# Patient Record
Sex: Male | Born: 1970 | Race: White | Hispanic: No | Marital: Married | Smoking: Former smoker
Health system: Southern US, Community
[De-identification: ages and names within clinical notes are randomized; demographics above are authoritative.]

## PROBLEM LIST (undated history)

## (undated) DIAGNOSIS — R911 Solitary pulmonary nodule: Secondary | ICD-10-CM

## (undated) DIAGNOSIS — F32A Depression, unspecified: Secondary | ICD-10-CM

## (undated) DIAGNOSIS — G43909 Migraine, unspecified, not intractable, without status migrainosus: Secondary | ICD-10-CM

## (undated) DIAGNOSIS — Z87891 Personal history of nicotine dependence: Secondary | ICD-10-CM

## (undated) DIAGNOSIS — I428 Other cardiomyopathies: Secondary | ICD-10-CM

## (undated) DIAGNOSIS — K219 Gastro-esophageal reflux disease without esophagitis: Secondary | ICD-10-CM

## (undated) DIAGNOSIS — J449 Chronic obstructive pulmonary disease, unspecified: Secondary | ICD-10-CM

## (undated) DIAGNOSIS — I4510 Unspecified right bundle-branch block: Secondary | ICD-10-CM

## (undated) DIAGNOSIS — Z87442 Personal history of urinary calculi: Secondary | ICD-10-CM

## (undated) DIAGNOSIS — M1990 Unspecified osteoarthritis, unspecified site: Secondary | ICD-10-CM

## (undated) DIAGNOSIS — J439 Emphysema, unspecified: Secondary | ICD-10-CM

## (undated) DIAGNOSIS — Z7952 Long term (current) use of systemic steroids: Secondary | ICD-10-CM

## (undated) DIAGNOSIS — R590 Localized enlarged lymph nodes: Secondary | ICD-10-CM

## (undated) DIAGNOSIS — J189 Pneumonia, unspecified organism: Secondary | ICD-10-CM

## (undated) DIAGNOSIS — R7303 Prediabetes: Secondary | ICD-10-CM

## (undated) DIAGNOSIS — I5020 Unspecified systolic (congestive) heart failure: Secondary | ICD-10-CM

## (undated) DIAGNOSIS — F419 Anxiety disorder, unspecified: Secondary | ICD-10-CM

## (undated) DIAGNOSIS — E2749 Other adrenocortical insufficiency: Secondary | ICD-10-CM

## (undated) DIAGNOSIS — R0600 Dyspnea, unspecified: Secondary | ICD-10-CM

## (undated) DIAGNOSIS — D869 Sarcoidosis, unspecified: Secondary | ICD-10-CM

## (undated) HISTORY — PX: COLONOSCOPY: SHX174

## (undated) HISTORY — DX: Chronic obstructive pulmonary disease, unspecified: J44.9

## (undated) HISTORY — DX: Emphysema, unspecified: J43.9

## (undated) HISTORY — DX: Solitary pulmonary nodule: R91.1

## (undated) HISTORY — DX: Unspecified right bundle-branch block: I45.10

## (undated) HISTORY — DX: Unspecified systolic (congestive) heart failure: I50.20

## (undated) HISTORY — DX: Other cardiomyopathies: I42.8

## (undated) HISTORY — DX: Gastro-esophageal reflux disease without esophagitis: K21.9

## (undated) HISTORY — PX: UPPER GI ENDOSCOPY: SHX6162

---

## 2010-11-09 HISTORY — PX: BRONCHOSCOPY: SUR163

## 2017-10-22 ENCOUNTER — Emergency Department (HOSPITAL_COMMUNITY)
Admission: EM | Admit: 2017-10-22 | Discharge: 2017-10-22 | Disposition: A | Discharge status: Home / Self Care | Payer: 59 | Attending: Emergency Medicine | Admitting: Emergency Medicine

## 2017-10-22 ENCOUNTER — Encounter (HOSPITAL_COMMUNITY): Payer: Self-pay | Admitting: *Deleted

## 2017-10-22 ENCOUNTER — Other Ambulatory Visit: Payer: Self-pay

## 2017-10-22 ENCOUNTER — Emergency Department (HOSPITAL_COMMUNITY): Payer: 59

## 2017-10-22 DIAGNOSIS — J189 Pneumonia, unspecified organism: Secondary | ICD-10-CM | POA: Insufficient documentation

## 2017-10-22 DIAGNOSIS — F17220 Nicotine dependence, chewing tobacco, uncomplicated: Secondary | ICD-10-CM | POA: Diagnosis not present

## 2017-10-22 DIAGNOSIS — R05 Cough: Secondary | ICD-10-CM | POA: Diagnosis present

## 2017-10-22 HISTORY — DX: Sarcoidosis, unspecified: D86.9

## 2017-10-22 MED ORDER — LEVOFLOXACIN 750 MG PO TABS: 750.0000 mg | ORAL_TABLET | Freq: Every day | ORAL | 0 refills | Status: DC

## 2017-10-22 NOTE — ED Triage Notes (Signed)
Pt reports non-productive cough x 4 days ago, a day after returning from [place].  Reports hx of sarcoidosis.  Pt reports feeling lightheadedness last night d/t coughing severe cough - denies any now.  Pt reports pain in his chest when coughing and taking a deep breath.

## 2017-10-22 NOTE — ED Provider Notes (Signed)
[facility]-EMERGENCY DEPT Provider Note   CSN: [id number] Arrival date & time: 10/22/17  1827     History   Chief Complaint Chief Complaint  Patient presents with  . Cough    HPI Steve Wang is a 47 y.o. male.  47 year old male presents with cough congestion times several days.  Does have a history of sarcoidosis.  Had recent travel history and denies any pleuritic or pulmonary embolism symptoms.  Cough is been productive of green-yellow sputum.  No fever or chills.  No vomiting or diarrhea.  No dyspnea on exertion.  No leg pain or swelling.  Denies any anginal symptoms or CHF symptoms.  Patient concerned he may have pneumonia and that is why he is here today.     Past Medical History:  Diagnosis Date  . Sarcoidosis     There are no active problems to display for this patient.   History reviewed. No pertinent surgical history.      Home Medications    Prior to Admission medications   Not on File    Family History No family history on file.  Social History Social History   Tobacco Use  . Smoking status: Former Smoker    Types: Cigarettes    Last attempt to quit: 2009    Years since quitting: 10.2  . Smokeless tobacco: Current User    Types: Chew  Substance Use Topics  . Alcohol use: Yes    Comment: rarely  . Drug use: Not Currently     Allergies   Patient has no known allergies.   Review of Systems Review of Systems  All other systems reviewed and are negative.    Physical Exam Updated Vital Signs BP 117/77 (BP Location: Right Arm)   Pulse 77   Temp 99.1 F (37.3 C) (Oral)   Resp 16   Ht 1.676 m (5\' 6" )   Wt 77.1 kg (170 lb)   SpO2 96%   BMI 27.44 kg/m   Physical Exam  Constitutional: He is oriented to person, place, and time. He appears well-developed and well-nourished.  Non-toxic appearance. No distress.  HENT:  Head: Normocephalic and atraumatic.  Eyes: Pupils are equal, round, and reactive to light.  Conjunctivae, EOM and lids are normal.  Neck: Normal range of motion. Neck supple. No tracheal deviation present. No thyroid mass present.  Cardiovascular: Normal rate, regular rhythm and normal heart sounds. Exam reveals no gallop.  No murmur heard. Pulmonary/Chest: Effort normal. No stridor. No respiratory distress. He has decreased breath sounds in the right lower field and the left lower field. He has wheezes in the right lower field and the left lower field. He has no rhonchi. He has no rales.  Abdominal: Soft. Normal appearance and bowel sounds are normal. He exhibits no distension. There is no tenderness. There is no rebound and no CVA tenderness.  Musculoskeletal: Normal range of motion. He exhibits no edema or tenderness.  Neurological: He is alert and oriented to person, place, and time. He has normal strength. No cranial nerve deficit or sensory deficit. GCS eye subscore is 4. GCS verbal subscore is 5. GCS motor subscore is 6.  Skin: Skin is warm and dry. No abrasion and no rash noted.  Psychiatric: He has a normal mood and affect. His speech is normal and behavior is normal.  Nursing note and vitals reviewed.    ED Treatments / Results  Labs (all labs ordered are listed, but only abnormal results are displayed) Labs Reviewed -  No data to display  EKG None  Radiology Dg Chest 2 View  Result Date: 10/22/2017 CLINICAL DATA:  Cough and fevers for 1 week, history of sarcoidosis EXAM: CHEST - 2 VIEW COMPARISON:  None. FINDINGS: Cardiac shadow is within normal limits. Some chronic fibrotic appearing changes are noted. Some slight increased density is noted in the right perihilar region which may represent some acute on chronic infiltrate. No bony abnormality is seen. No effusion is noted. IMPRESSION: Changes consistent with the given clinical history of sarcoidosis. Slight increased density is noted in the right perihilar region likely representing acute on chronic infiltrate. Followup  PA and lateral chest X-ray is recommended in 3-4 weeks following trial of antibiotic therapy to ensure resolution and exclude underlying malignancy. Electronically Signed   By: Alcide Clever M.D.   On: 10/22/2017 19:51    Procedures Procedures (including critical care time)  Medications Ordered in ED Medications - No data to display   Initial Impression / Assessment and Plan / ED Course  I have reviewed the triage vital signs and the nursing notes.  Pertinent labs & imaging results that were available during my care of the patient were reviewed by me and considered in my medical decision making (see chart for details).     Place patient on Levaquin and discharged home with return precautions  Final Clinical Impressions(s) / ED Diagnoses   Final diagnoses:  None    ED Discharge Orders    None       Lorre Nick, MD 10/22/17 2120

## 2018-06-25 ENCOUNTER — Encounter: Payer: Self-pay | Admitting: Family Medicine

## 2018-06-25 ENCOUNTER — Ambulatory Visit (INDEPENDENT_AMBULATORY_CARE_PROVIDER_SITE_OTHER): Payer: 59 | Admitting: Family Medicine

## 2018-06-25 VITALS — BP 120/82 | HR 69 | Temp 99.0°F | Resp 16 | Ht 66.5 in | Wt 154.0 lb

## 2018-06-25 DIAGNOSIS — D860 Sarcoidosis of lung: Secondary | ICD-10-CM | POA: Diagnosis not present

## 2018-06-25 DIAGNOSIS — Z23 Encounter for immunization: Secondary | ICD-10-CM | POA: Diagnosis not present

## 2018-06-25 DIAGNOSIS — M25511 Pain in right shoulder: Secondary | ICD-10-CM | POA: Diagnosis not present

## 2018-06-25 DIAGNOSIS — Z8669 Personal history of other diseases of the nervous system and sense organs: Secondary | ICD-10-CM

## 2018-06-25 DIAGNOSIS — Z13228 Encounter for screening for other metabolic disorders: Secondary | ICD-10-CM

## 2018-06-25 DIAGNOSIS — Z113 Encounter for screening for infections with a predominantly sexual mode of transmission: Secondary | ICD-10-CM

## 2018-06-25 DIAGNOSIS — G8929 Other chronic pain: Secondary | ICD-10-CM | POA: Insufficient documentation

## 2018-06-25 DIAGNOSIS — Z136 Encounter for screening for cardiovascular disorders: Secondary | ICD-10-CM

## 2018-06-25 DIAGNOSIS — Z87442 Personal history of urinary calculi: Secondary | ICD-10-CM | POA: Insufficient documentation

## 2018-06-25 DIAGNOSIS — K219 Gastro-esophageal reflux disease without esophagitis: Secondary | ICD-10-CM | POA: Insufficient documentation

## 2018-06-25 NOTE — Progress Notes (Signed)
Patient: Steve Wang Male    DOB: 1970-10-25   47 y.o.   MRN: [id number] Visit Date: 06/25/2018  Today's Provider: [name], MD   Chief Complaint  Patient presents with  . Establish Care   Subjective:    HPI Establish Care: Patient is here today to establish care. Patient moved from [place] to [place] about 19 months ago, working in [employer] housing, mainly in [place]. He has several issues he would like addressed today.  He states he is in a new relationship and requests STD screening. He denies any urinary or other STD symptoms at this time.   He also reports that he was diagnosed with sarcoidosis around 2011. He was followed by pulmonary and was on prednisone and methotrexate for awhile, but has not been off of all treatment for 5-6 years. He states he does get short of breath after walking about 30 years, but has not been progressive. He also reports about once or twice a month he will have episode of having crampy pain on the right side of his chest associated with taking deep breaths that will will resolve after a couple of minutes. Has been a few weeks since last episode.   He also reports he has been having frequent pain in his right shoulder, thinks he may have pulled a muscle but doesn't recall any specific injury.   He also reports he has been getting migraine headaches starting about 5 years ago. They occur about once a month. Usually started behind right eye, then spread across both eyes, sometimes associated with nausea and vomiting. Will usually resolve a few hours after taking an Excedrin Migraine.   He also states he has been having pain in his right shoulder for several months limited use, especially overhead lifting.     No Known Allergies  No current outpatient medications on file.  Review of Systems  Constitutional: Negative for appetite change, chills and fever.  Respiratory: Positive for cough (dry) and shortness of breath (when climbing  stairs). Negative for chest tightness and wheezing.   Cardiovascular: Negative for chest pain and palpitations.  Gastrointestinal: Negative for abdominal pain, nausea and vomiting.    Social History   Tobacco Use  . Smoking status: Former Smoker    Types: Cigarettes    Last attempt to quit: 07/23/2008    Years since quitting: 9.9  . Smokeless tobacco: Current User    Types: Chew  Substance Use Topics  . Alcohol use: Yes    Comment: rarely   Objective:   BP 120/82 (BP Location: Left Arm, Patient Position: Sitting, Cuff Size: Large)   Pulse 69   Temp 99 F (37.2 C) (Oral)   Resp 16   Ht 5' 6.5" (1.689 m)   Wt 154 lb (69.9 kg)   SpO2 98% Comment: room air  BMI 24.48 kg/m  Vitals:   06/25/18 1401  BP: 120/82  Pulse: 69  Resp: 16  Temp: 99 F (37.2 C)  TempSrc: Oral  SpO2: 98%  Weight: 154 lb (69.9 kg)  Height: 5' 6.5" (1.689 m)     Physical Exam   General Appearance:    Alert, cooperative, no distress  Eyes:    PERRL, conjunctiva/corneas clear, EOM's intact       Lungs:     Clear to auscultation bilaterally, respirations unlabored  Heart:    Regular rate and rhythm  Neurologic:   Awake, alert, oriented x 3. No apparent focal neurological  defect.          Assessment & Plan:     1. Sarcoidosis of lung (HCC) Previously on prednisone and methotrexate per patient, but off meds and stable for several years. No other systems affected. He did have xray with density in right perihilar region in April when he was treated with levofloxacin. Needs follow up Xray.  Consider referral for PFTs.  - DG Chest 2 View; Future  - Pneumococcal polysaccharide vaccine 23-valent greater than or equal to 2yo subcutaneous/IM  2. Need for prophylactic vaccination using tetanus and diphtheria toxoids adsorbed (Td) vaccine  - Tdap vaccine greater than or equal to 7yo IM  3. Need for pneumococcal vaccination  - Pneumococcal polysaccharide vaccine 23-valent greater than or  equal to 2yo subcutaneous/IM  4. Chronic right shoulder pain Xray right shoulder.   5. Screen for STD (sexually transmitted disease)  - HIV Antibody (routine testing w rflx) - RPR - GC/Chlamydia Probe Amp  6. Screening for metabolic disorder  - Comprehensive metabolic panel - Lipid panel  7. Encounter for special screening examination for cardiovascular disorder  - Comprehensive metabolic panel - Lipid panel  8. History of migraine        [name], MD  [facility]

## 2018-06-25 NOTE — Patient Instructions (Signed)
.   Please go to the lab draw station in [address] on the second floor of [facility]   Go to the [facility] on [address] for chest and right shoulder Xray

## 2018-06-26 ENCOUNTER — Telehealth: Payer: Self-pay

## 2018-06-26 LAB — GC/CHLAMYDIA PROBE AMP
CHLAMYDIA, DNA PROBE: NEGATIVE
NEISSERIA GONORRHOEAE BY PCR: NEGATIVE

## 2018-06-26 LAB — COMPREHENSIVE METABOLIC PANEL
A/G RATIO: 1.6 (ref 1.2–2.2)
ALBUMIN: 4.5 g/dL (ref 3.5–5.5)
ALT: 21 IU/L (ref 0–44)
AST: 19 IU/L (ref 0–40)
Alkaline Phosphatase: 65 IU/L (ref 39–117)
BUN/Creatinine Ratio: 11 (ref 9–20)
BUN: 10 mg/dL (ref 6–24)
Bilirubin Total: 0.5 mg/dL (ref 0.0–1.2)
CO2: 24 mmol/L (ref 20–29)
Calcium: 9.6 mg/dL (ref 8.7–10.2)
Chloride: 101 mmol/L (ref 96–106)
Creatinine, Ser: 0.91 mg/dL (ref 0.76–1.27)
GFR, EST AFRICAN AMERICAN: 116 mL/min/{1.73_m2} (ref >59)
GFR, EST NON AFRICAN AMERICAN: 101 mL/min/{1.73_m2} (ref >59)
GLOBULIN, TOTAL: 2.8 g/dL (ref 1.5–4.5)
Glucose: 88 mg/dL (ref 65–99)
POTASSIUM: 4.6 mmol/L (ref 3.5–5.2)
SODIUM: 140 mmol/L (ref 134–144)
TOTAL PROTEIN: 7.3 g/dL (ref 6.0–8.5)

## 2018-06-26 LAB — LIPID PANEL
CHOL/HDL RATIO: 2.7 ratio (ref 0.0–5.0)
Cholesterol, Total: 155 mg/dL (ref 100–199)
HDL: 57 mg/dL (ref >39)
LDL Calculated: 84 mg/dL (ref 0–99)
Triglycerides: 70 mg/dL (ref 0–149)
VLDL Cholesterol Cal: 14 mg/dL (ref 5–40)

## 2018-06-26 LAB — HIV ANTIBODY (ROUTINE TESTING W REFLEX): HIV SCREEN 4TH GENERATION: NONREACTIVE

## 2018-06-26 LAB — RPR: RPR Ser Ql: NONREACTIVE

## 2018-06-26 NOTE — Telephone Encounter (Signed)
Left message to call back  

## 2018-06-26 NOTE — Telephone Encounter (Signed)
Advised patient of results.  

## 2018-06-26 NOTE — Telephone Encounter (Signed)
-----   Message from Malva Limes, MD sent at 06/26/2018 12:16 PM EST ----- Labs all normal. STD screenings including HIV, syphilis, GC and gonorrhea or all negative.

## 2018-06-27 ENCOUNTER — Ambulatory Visit
Admission: RE | Admit: 2018-06-27 | Discharge: 2018-06-27 | Disposition: A | Discharge status: Home / Self Care | Payer: 59 | Source: Ambulatory Visit | Attending: Family Medicine | Admitting: Family Medicine

## 2018-06-27 ENCOUNTER — Ambulatory Visit
Admission: RE | Admit: 2018-06-27 | Discharge: 2018-06-27 | Disposition: A | Discharge status: Home / Self Care | Payer: 59 | Attending: Family Medicine | Admitting: Family Medicine

## 2018-06-27 DIAGNOSIS — D869 Sarcoidosis, unspecified: Secondary | ICD-10-CM | POA: Insufficient documentation

## 2018-06-27 DIAGNOSIS — D860 Sarcoidosis of lung: Secondary | ICD-10-CM

## 2018-06-27 DIAGNOSIS — M19011 Primary osteoarthritis, right shoulder: Secondary | ICD-10-CM | POA: Diagnosis not present

## 2018-06-27 DIAGNOSIS — J449 Chronic obstructive pulmonary disease, unspecified: Secondary | ICD-10-CM | POA: Diagnosis not present

## 2018-06-30 ENCOUNTER — Telehealth: Payer: Self-pay

## 2018-06-30 DIAGNOSIS — G8929 Other chronic pain: Secondary | ICD-10-CM

## 2018-06-30 DIAGNOSIS — M25511 Pain in right shoulder: Principal | ICD-10-CM

## 2018-06-30 NOTE — Telephone Encounter (Signed)
-----   Message from Malva Limes, MD sent at 06/28/2018  6:15 PM EST ----- Xray shows spur in his shoulder joint which is probably cause of his shoulder pain. Recommend physical therapy referral.  X-ray shows mild sarcoidosis. Need to check chest xray and labs yearly.

## 2018-06-30 NOTE — Telephone Encounter (Signed)
Patient calling back with questions. He is asking if there's treatment for the Sarcoidosis and if PT doesn't help the spur in his joint what would be the next step.  Please Review.

## 2018-06-30 NOTE — Telephone Encounter (Signed)
Pt advised.   Thanks,   -Ramond Darnell  

## 2018-06-30 NOTE — Telephone Encounter (Addendum)
Sarcoid usually just requires monitoring with blood tests and lung functions tests. If it is progressing then it is treated with immunosuppressant medications  I think it would be worthwhile to follow up with a lung specialist for monitoring. We can refer him to lung specialist in [facility] that manages pulmonary sarcoid.   If shoulder doesn't improve with PT and he wants something more done, then he will need to see an orthopedist. They would probably give him a steroid injection in his shoulder and if all else fails could do shoulder surgery.

## 2018-06-30 NOTE — Telephone Encounter (Signed)
Patient advised as directed below. Patient requested copy of results. Printed and placed up front ready for pick up.  Patient agree to referral.  Thanks,  -Tayshaun Kroh

## 2018-06-30 NOTE — Telephone Encounter (Signed)
Please advise.  (I think he was looking at the chest Xray in 10/2017)  Thanks,   -Vernona Rieger

## 2018-06-30 NOTE — Telephone Encounter (Signed)
I think he is looking at the xray from April. There is nothing on current xray report about antibiotic.

## 2018-06-30 NOTE — Telephone Encounter (Signed)
Patient called back requesting clarification from CMA in reference to his chest x-ray. Patient states that on my chart annotation shows that patient should be on a antibiotic. Patient wants to know if Dr. Sherrie Mustache is prescribing antibiotic? Please advise. KW

## 2018-07-01 NOTE — Telephone Encounter (Signed)
Pt advised.  Is there a way to release is recent lab work and Xray results to he can see it on My Chart?  Thanks,   -Vernona Rieger

## 2018-07-01 NOTE — Telephone Encounter (Signed)
LMTCB 07/01/2018  Thanks,   -Lynnann Knudsen  

## 2018-08-05 ENCOUNTER — Encounter: Payer: Self-pay | Admitting: Family Medicine

## 2018-08-05 ENCOUNTER — Ambulatory Visit (INDEPENDENT_AMBULATORY_CARE_PROVIDER_SITE_OTHER): Payer: 59 | Admitting: Family Medicine

## 2018-08-05 VITALS — BP 112/72 | HR 68 | Temp 98.5°F | Resp 16 | Ht 67.0 in | Wt 156.0 lb

## 2018-08-05 DIAGNOSIS — D860 Sarcoidosis of lung: Secondary | ICD-10-CM

## 2018-08-05 DIAGNOSIS — Z0000 Encounter for general adult medical examination without abnormal findings: Secondary | ICD-10-CM

## 2018-08-05 DIAGNOSIS — R21 Rash and other nonspecific skin eruption: Secondary | ICD-10-CM

## 2018-08-05 DIAGNOSIS — I4510 Unspecified right bundle-branch block: Secondary | ICD-10-CM | POA: Diagnosis not present

## 2018-08-05 DIAGNOSIS — G4710 Hypersomnia, unspecified: Secondary | ICD-10-CM

## 2018-08-05 DIAGNOSIS — R9431 Abnormal electrocardiogram [ECG] [EKG]: Secondary | ICD-10-CM

## 2018-08-05 NOTE — Patient Instructions (Signed)
. Please bring all of your medications to every appointment so we can make sure that our medication list is the same as yours.    Right Bundle Branch Block  Right bundle branch block (RBBB) is a problem with the way that electrical impulses pass through the heart (electrical conduction abnormality). The heart depends on an electrical pulse to beat normally. The electrical signal for a heartbeat starts in the upper chambers of the heart (atria) and then travels to the two lower chambers (left and rightventricles). An RBBB is a partial or complete block of the pathway that carries the signal to the right ventricle. If you have RBBB, the right side of your heart beats a little more slowly than the left side. RBBB may be a warning of heart disease or a lung problem. What are the causes? This condition may be caused by:  Heart attack (myocardial infarction).  Being born with a heart defect (congenital heart disease).  A blood clot that flows into the lung (pulmonary embolism).  Infection of heart muscle (myocarditis).  High blood pressure (hypertension). In some cases, the cause may not be known. What increases the risk? The following factors may make you more likely to develop this condition:  Being male.  Being 47 years of age or older.  Having heart disease.  Having had a heart attack or heart surgery. What are the signs or symptoms? This condition does not typically cause symptoms. How is this diagnosed? This condition may be diagnosed based on an electrocardiogram (ECG). It is often diagnosed when an ECG is done as part of a routine physical. You may also have imaging tests to find out more about your condition. These may include:  Chest X-rays.  Echocardiogram. How is this treated? Treatment may not be needed for this condition if you do not have symptoms or any other heart problems. However, you may need to see your health care provider more often because RBBB can be a  warning sign of future heart or lung problems. You may need treatment for another condition that may be causing RBBB. If other forms of heart block are present and you are having symptoms, a pacemaker is sometimes necessary. Follow these instructions at home:  Follow instructions from your health care provider about eating or drinking restrictions.  Take over-the-counter and prescription medicines only as told by your health care provider.  Return to your normal activities as told by your health care provider. Ask your health care provider what activities are safe for you.  Follow a heart-healthy diet and maintain a healthy weight. Work with a diet and nutrition specialist (dietitian) to create an eating plan that is best for you.  Get regular exercise as told by your health care provider.  Do not use any products that contain nicotine or tobacco, such as cigarettes and e-cigarettes. If you need help quitting, ask your health care provider.  Keep all follow-up visits as told by your health care provider. This is important. Contact a health care provider if:  You are light-headed or you faint. Get help right away if:  You have chest pain.  You have difficulty breathing. These symptoms may represent a serious problem that is an emergency. Do not wait to see if the symptoms will go away. Get medical help right away. Call your local emergency services (911 in the [place] Do not drive yourself to the hospital.  Summary  For the heart to beat normally, an electrical signal must travel to the  heart's lower right chamber (right ventricle). Right bundle branch block (RBBB) is a partial or complete block of the pathway that carries that signal.  This condition does not typically cause symptoms.  Treatment may not be needed for RBBB if you do not have symptoms or any other heart problems.  You may need to see your health care provider more often because RBBB can be a warning sign of future  heart or lung problems. This information is not intended to replace advice given to you by your health care provider. Make sure you discuss any questions you have with your health care provider. Document Released: 12/20/2016 Document Revised: 12/20/2016 Document Reviewed: 12/20/2016 Elsevier Interactive Patient Education  2019 [employer].

## 2018-08-05 NOTE — Progress Notes (Signed)
Patient: Steve Wang, Male    DOB: [birth year], 48 y.o.   MRN: [id number] Visit Date: 08/05/2018  Today's Provider: [name], MD   Chief Complaint  Patient presents with  . Annual Exam   Subjective:    Annual physical exam Steve Wang is a 48 y.o. male who presents today for health maintenance and complete physical. He feels fairly well. He reports exercising daily. He reports he is sleeping poorly.  -----------------------------------------------------------------  Follow up for Sarcoidosis of Lung:  The patient was last seen for this 1 months ago. Management during the last visit include ordering a chest x ray, which was stable. Patient was advised to have follow up chest x- ray yearly.  He reports good compliance with treatment. He feels that condition is stable.  ------------------------------------------------------------------------------------  He has had trouble with flaking head and scalp and faint red scaly rash on his chest that comes and goes.   Review of Systems  Constitutional: Negative for appetite change, chills, fatigue and fever.  HENT: Positive for tinnitus. Negative for congestion, ear pain, hearing loss, nosebleeds and trouble swallowing.   Eyes: Negative for pain and visual disturbance.  Respiratory: Negative for cough, chest tightness and shortness of breath.   Cardiovascular: Negative for chest pain, palpitations and leg swelling.  Gastrointestinal: Negative for abdominal pain, blood in stool, constipation, diarrhea, nausea and vomiting.  Endocrine: Negative for polydipsia, polyphagia and polyuria.  Genitourinary: Negative for dysuria and flank pain.  Musculoskeletal: Negative for arthralgias, back pain, joint swelling, myalgias and neck stiffness.  Skin: Negative for color change, rash and wound.  Neurological: Negative for dizziness, tremors, seizures, speech difficulty, weakness, light-headedness and headaches.  Psychiatric/Behavioral:  Negative for behavioral problems, confusion, decreased concentration, dysphoric mood and sleep disturbance. The patient is not nervous/anxious.   All other systems reviewed and are negative.   Social History      He  reports that he quit smoking about 10 years ago. His smoking use included cigarettes. His smokeless tobacco use includes chew. He reports current alcohol use. He reports previous drug use.       Social History   Socioeconomic History  . Marital status: Single    Spouse name: Not on file  . Number of children: 0  . Years of education: Not on file  . Highest education level: Not on file  Occupational History  . Occupation: [employer]  Social Needs  . Financial resource strain: Not on file  . Food insecurity:    Worry: Not on file    Inability: Not on file  . Transportation needs:    Medical: Not on file    Non-medical: Not on file  Tobacco Use  . Smoking status: Former Smoker    Types: Cigarettes    Last attempt to quit: 07/23/2008    Years since quitting: 10.0  . Smokeless tobacco: Current User    Types: Chew  . Tobacco comment: smoked about 1 ppd for 24 years  Substance and Sexual Activity  . Alcohol use: Yes    Comment: rarely  . Drug use: Not Currently  . Sexual activity: Not on file  Lifestyle  . Physical activity:    Days per week: Not on file    Minutes per session: Not on file  . Stress: Not on file  Relationships  . Social connections:    Talks on phone: Not on file    Gets together: Not on file    Attends religious service: Not  on file    Active member of club or organization: Not on file    Attends meetings of clubs or organizations: Not on file    Relationship status: Not on file  Other Topics Concern  . Not on file  Social History Narrative  . Not on file    Past Medical History:  Diagnosis Date  . GERD (gastroesophageal reflux disease)   . Sarcoidosis      Patient Active Problem List   Diagnosis Date Noted  . GERD  (gastroesophageal reflux disease) 06/25/2018  . Sarcoidosis of lung (HCC) 06/25/2018  . Chronic right shoulder pain 06/25/2018  . History of migraine 06/25/2018  . History of kidney stones 06/25/2018    No past surgical history on file.  Family History        Family Status  Relation Name Status  . Mother  Alive  . Father  Deceased  . Brother Half brother Alive        His family history is not on file.      No Known Allergies  No current outpatient medications on file.   Patient Care Team: Malva LimesFisher, Neriah Brott E, MD as PCP - General (Family Medicine)      Objective:   Vitals: BP 112/72 (BP Location: Right Arm, Cuff Size: Large)   Pulse 68   Temp 98.5 F (36.9 C) (Oral)   Resp 16   Ht 5\' 7"  (1.702 m)   Wt 156 lb (70.8 kg)   SpO2 96% Comment: room air  BMI 24.43 kg/m    Vitals:   08/05/18 1508 08/05/18 1510  BP: 102/72 112/72  Pulse: 68   Resp: 16   Temp: 98.5 F (36.9 C)   TempSrc: Oral   SpO2: 96%   Weight: 156 lb (70.8 kg)   Height: 5\' 7"  (1.702 m)      Physical Exam   General Appearance:    Alert, cooperative, no distress, appears stated age  Head:    Normocephalic, without obvious abnormality, atraumatic  Eyes:    PERRL, conjunctiva/corneas clear, EOM's intact, fundi    benign, both eyes       Ears:    Normal TM's and external ear canals, both ears  Nose:   Nares normal, septum midline, mucosa normal, no drainage   or sinus tenderness  Throat:   Lips, mucosa, and tongue normal; teeth and gums normal  Neck:   Supple, symmetrical, trachea midline, no adenopathy;       thyroid:  No enlargement/tenderness/nodules; no carotid   bruit or JVD  Back:     Symmetric, no curvature, ROM normal, no CVA tenderness  Lungs:     Clear to auscultation bilaterally, respirations unlabored  Chest wall:    No tenderness or deformity  Heart:    Regular rate and rhythm, S1 and S2 normal, no murmur, rub   or gallop  Abdomen:     Soft, non-tender, bowel sounds active all  four quadrants,    no masses, no organomegaly  Genitalia:    deferred  Rectal:    deferred  Extremities:   Extremities normal, atraumatic, no cyanosis or edema  Pulses:   2+ and symmetric all extremities  Skin:   Skin color, texture, turgor normal, no rashes or lesions  Lymph nodes:   Cervical, supraclavicular, and axillary nodes normal  Neurologic:   CNII-XII intact. 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   -Atypical T axis -possible acute process.   ABNORMAL   Assessment & Plan:     Routine Health Maintenance and Physical Exam  Exercise Activities and Dietary recommendations Goals   None     Immunization History  Administered Date(s) Administered  . Pneumococcal Polysaccharide-23 06/25/2018  . Tdap 06/25/2018    Health Maintenance  Topic Date Due  . INFLUENZA VACCINE  12/19/2018 (Originally 02/20/2018)  . TETANUS/TDAP  06/25/2028  . HIV Screening  Completed     Discussed health benefits of physical activity, and encouraged him to engage in regular exercise appropriate for his age and condition.    --------------------------------------------------------------------  1. Annual physical exam  - EKG 12-Lead - Ambulatory referral to Cardiology  2. Sarcoidosis of lung (HCC) Has not seen pulmonologist for several years having moved from [place] about 2 years ago. No recent change in breathing and  - EKG 12-Lead  3. Right bundle branch block (RBBB) Considering his age and history is concerning for cardiac sarcoid. Will have cardiology evaluate. Advised patient he will likely need to be seen in [facility] - Ambulatory referral to Cardiology  4. Hypersomnia  - Home sleep test  5. Abnormal EKG  - Ambulatory referral to Cardiology  6. Rash  - Ambulatory referral to  Dermatology    Mila Merryonald Xela Oregel, MD  [facility]

## 2018-08-15 ENCOUNTER — Telehealth: Payer: Self-pay | Admitting: Family Medicine

## 2018-08-15 NOTE — Telephone Encounter (Signed)
Order for home sleep study faxed to [facility]

## 2018-08-19 ENCOUNTER — Telehealth: Payer: Self-pay | Admitting: Family Medicine

## 2018-08-19 NOTE — Telephone Encounter (Signed)
FYI--Pt has refused cardiology referral. He was referred for Z00.00 (ICD-10-CM) - Annual physical exam I45.10 (ICD-10-CM) - Right bundle branch block (RBBB) R94.31 (ICD-10-CM) - Abnormal EKG

## 2018-08-21 NOTE — Telephone Encounter (Signed)
Pt has set up appointment with Dr Allyson Sabal for Feb 2020.Disregard previous message

## 2018-09-10 ENCOUNTER — Ambulatory Visit (INDEPENDENT_AMBULATORY_CARE_PROVIDER_SITE_OTHER): Payer: 59 | Admitting: Cardiovascular Disease

## 2018-09-10 ENCOUNTER — Encounter: Payer: Self-pay | Admitting: Cardiovascular Disease

## 2018-09-10 VITALS — BP 98/64 | HR 83 | Ht 67.0 in | Wt 157.0 lb

## 2018-09-10 DIAGNOSIS — I4510 Unspecified right bundle-branch block: Secondary | ICD-10-CM | POA: Insufficient documentation

## 2018-09-10 NOTE — Patient Instructions (Signed)
Medication Instructions:  Your physician recommends that you continue on your current medications as directed. Please refer to the Current Medication list given to you today.  If you need a refill on your cardiac medications before your next appointment, please call your pharmacy.   Lab work: NONE If you have labs (blood work) drawn today and your tests are completely normal, you will receive your results only by: Marland Kitchen MyChart Message (if you have MyChart) OR . A paper copy in the mail If you have any lab test that is abnormal or we need to change your treatment, we will call you to review the results.  Testing/Procedures: Your physician has requested that you have an echocardiogram. Echocardiography is a painless test that uses sound waves to create images of your heart. It provides your doctor with information about the size and shape of your heart and how well your heart's chambers and valves are working. This procedure takes approximately one hour. There are no restrictions for this procedure.    Follow-Up: At [facility], you and your health needs are our priority.  As part of our continuing mission to provide you with exceptional heart care, we have created designated Provider Care Teams.  These Care Teams include your primary Cardiologist (physician) and Advanced Practice Providers (APPs -  Physician Assistants and Nurse Practitioners) who all work together to provide you with the care you need, when you need it. . You will need a follow up appointment AS NEEDED. You may see Dr. Allyson Sabal or one of the following Advanced Practice Providers on your designated Care Team:   . Corine Shelter, [place] . Azalee Course, PA-C . Micah Flesher, PA-C . Joni Reining, DNP . Theodore Demark, PA-C . Judy Pimple, PA-C . Marjie Skiff, PA-C

## 2018-09-10 NOTE — Progress Notes (Signed)
09/10/2018 Steve Wang   1970/12/20  [id number]  Primary Physician Steve Wang Steve Isaacs, MD Primary Cardiologist: Steve Gess MD Steve Wang, Steve Wang  HPI:  Steve Wang is a 48 y.o. 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  He was referred by Dr. Sherrie Wang, his PCP, for evaluation of newly recognized right bundle branch block.  He has no cardiac risk factors.  He quit smoking 18 years ago.  There is no family history for heart disease.  Never had a heart attack or stroke.  He does complain of some dyspnea but denies chest pain.  He had pulmonary sarcoidosis back in 2009 diagnosed [facility] in [place] and was treated with steroids and methotrexate.  There is no mention of cardiac involvement.   No outpatient medications have been marked as taking for the 09/10/18 encounter (Office Visit) with Steve Gess, MD.     No Known Allergies  Social History   Socioeconomic History  . Marital status: Single    Spouse name: Not on file  . Number of children: 0  . Years of education: Not on file  . Highest education level: Not on file  Occupational History  . Occupation: [employer]  Social Needs  . Financial resource strain: Not on file  . Food insecurity:    Worry: Not on file    Inability: Not on file  . Transportation needs:    Medical: Not on file    Non-medical: Not on file  Tobacco Use  . Smoking status: Former Smoker    Types: Cigarettes    Last attempt to quit: 07/23/2008    Years since quitting: 10.1  . Smokeless tobacco: Current User    Types: Chew  . Tobacco comment: smoked about 1 ppd for 24 years  Substance and Sexual Activity  . Alcohol use: Yes    Comment: rarely  . Drug use: Not Currently  . Sexual activity: Not on file  Lifestyle  . Physical activity:    Days per week: Not on file    Minutes per session: Not on file  . Stress: Not on  file  Relationships  . Social connections:    Talks on phone: Not on file    Gets together: Not on file    Attends religious service: Not on file    Active member of club or organization: Not on file    Attends meetings of clubs or organizations: Not on file    Relationship status: Not on file  . Intimate partner violence:    Fear of current or ex partner: Not on file    Emotionally abused: Not on file    Physically abused: Not on file    Forced sexual activity: Not on file  Other Topics Concern  . Not on file  Social History Narrative  . Not on file     Review of Systems: General: negative for chills, fever, night sweats or weight changes.  Cardiovascular: negative for chest pain, dyspnea on exertion, edema, orthopnea, palpitations, paroxysmal nocturnal dyspnea or shortness of breath Dermatological: negative for rash Respiratory: negative for cough or wheezing Urologic: negative for hematuria Abdominal: negative for nausea, vomiting, diarrhea, bright red blood per rectum, melena, or hematemesis Neurologic: negative for visual changes, syncope, or dizziness All other systems reviewed and are otherwise negative except as noted above.    Blood pressure 98/64, pulse  83, height 5\' 7"  (1.702 m), weight 157 lb (71.2 kg).  General appearance: alert and no distress Neck: no adenopathy, no carotid bruit, no JVD, supple, symmetrical, trachea midline and thyroid not enlarged, symmetric, no tenderness/mass/nodules Lungs: clear to auscultation bilaterally Heart: regular rate and rhythm, S1, S2 normal, no murmur, click, rub or gallop Extremities: extremities normal, atraumatic, no cyanosis or edema Pulses: 2+ and symmetric Skin: Skin color, texture, turgor normal. No rashes or lesions Neurologic: Alert and oriented X 3, normal strength and tone. Normal symmetric reflexes. Normal coordination and gait  EKG not performed today  ASSESSMENT AND PLAN:   Right bundle branch block [name]  was referred by Dr. Sherrie Wang for evaluation of right bundle branch block.  He is unaware that he has had an EKG anytime recently.  He does have a remote history of sarcoidosis with pulmonary involvement on steroids and methotrexate remotely at [facility].  He is completely asymptomatic.  Certainly possible that sarcoidosis has affected his conduction system.  We will get a 2D echo to further evaluate.  He has had no rhythm related abnormalities.      Steve Gess MD FACP,FACC,FAHA, [facility] 09/10/2018 1:33 PM

## 2018-09-10 NOTE — Assessment & Plan Note (Signed)
Steve Wang was referred by Dr. Sherrie Mustache for evaluation of right bundle branch block.  He is unaware that he has had an EKG anytime recently.  He does have a remote history of sarcoidosis with pulmonary involvement on steroids and methotrexate remotely at [facility] [place].  He is completely asymptomatic.  Certainly possible that sarcoidosis has affected his conduction system.  We will get a 2D echo to further evaluate.  He has had no rhythm related abnormalities.

## 2018-09-12 ENCOUNTER — Other Ambulatory Visit (HOSPITAL_COMMUNITY): Payer: 59

## 2018-09-15 ENCOUNTER — Other Ambulatory Visit (HOSPITAL_COMMUNITY): Payer: 59

## 2018-09-19 ENCOUNTER — Other Ambulatory Visit (HOSPITAL_COMMUNITY): Payer: 59

## 2018-09-22 ENCOUNTER — Ambulatory Visit (HOSPITAL_COMMUNITY): Payer: 59 | Attending: Internal Medicine

## 2018-09-22 DIAGNOSIS — I4510 Unspecified right bundle-branch block: Secondary | ICD-10-CM | POA: Diagnosis not present

## 2020-07-28 ENCOUNTER — Ambulatory Visit: Payer: Self-pay | Admitting: Family Medicine

## 2020-07-28 NOTE — Telephone Encounter (Signed)
Pt. Called to report testing positive for COVID one week ago.  Is asking for an appt. For evaluation.  C/o fever, body aches, and cough.  Last fever was on 07/23/20.  Reported increase in cough 2 days ago with an intermittent "burning" in the throat and upper chest.  Described as a "raw burning" when he coughs, and is felt in the throat and upper chest just below collar bone, mid-line. Denied radiation of the chest burning.  Denied any shortness of breath or wheezing.   Voiced concern about hx of Sarcoidosis, and poss. Need for abx.  Virtual appt. Sched. 07/29/20.  Care advice given per protocol.  Pt. Verb. Understanding.  Agreed with plan.  Reason for Disposition . [1] COVID-19 diagnosed by positive lab test (e.g., PCR, rapid self-test kit) AND [2] mild symptoms (e.g., cough, fever, others) AND [3] no complications or SOB    Reported an intermittent "raw burning" in throat and in upper chest, that is present with cough; denied any radiation of pain.  Denied any shortness of breath/ wheezing.  Answer Assessment - Initial Assessment Questions 1. COVID-19 DIAGNOSIS: "Who made your COVID-19 diagnosis?" "Was it confirmed by a positive lab test?" If not diagnosed by a HCP, ask "Are there lots of cases (community spread) where you live?" Note: See public health department website, if unsure.     Did home test and it was positive  2. COVID-19 EXPOSURE: "Was there any known exposure to COVID before the symptoms began?" CDC Definition of close contact: within 6 feet (2 meters) for a total of 15 minutes or more over a 24-hour period.       3. ONSET: "When did the COVID-19 symptoms start?"      07/21/20 4. WORST SYMPTOM: "What is your worst symptom?" (e.g., cough, fever, shortness of breath, muscle aches)     Cough and a burning sensation in upper chest/ throat area  5. COUGH: "Do you have a cough?" If Yes, ask: "How bad is the cough?"       Some phlegm-white  6. FEVER: "Do you have a fever?" If Yes, ask: "What is  your temperature, how was it measured, and when did it start?"     No fever since 07/23/20 7. RESPIRATORY STATUS: "Describe your breathing?" (e.g., shortness of breath, wheezing, unable to speak)      Denied  8. BETTER-SAME-WORSE: "Are you getting better, staying the same or getting worse compared to yesterday?"  If getting worse, ask, "In what way?"     Feels that he is better; cough increased 2 days ago  9. HIGH RISK DISEASE: "Do you have any chronic medical problems?" (e.g., asthma, heart or lung disease, weak immune system, obesity, etc.)     Sarcoidosis  10. VACCINE: "Have you gotten the COVID-19 vaccine?" If Yes ask: "Which one, how many shots, when did you get it?"       Denied  11. PREGNANCY: "Is there any chance you are pregnant?" "When was your last menstrual period?"       N/a  12. OTHER SYMPTOMS: "Do you have any other symptoms?"  (e.g., chills, fatigue, headache, loss of smell or taste, muscle pain, sore throat; new loss of smell or taste especially support the diagnosis of COVID-19)       Had fever; subsided after 1/1; c/o body aches, mostly dry cough with intermittent white phlegm.  Raw burning in throat and upper chest- just below collar bone.  Protocols used: CORONAVIRUS (COVID-19) DIAGNOSED OR SUSPECTED-A-AH  

## 2020-07-29 ENCOUNTER — Telehealth: Payer: 59 | Admitting: Family Medicine

## 2020-08-11 ENCOUNTER — Other Ambulatory Visit
Admission: RE | Admit: 2020-08-11 | Discharge: 2020-08-11 | Disposition: A | Discharge status: Home / Self Care | Payer: No Typology Code available for payment source | Source: Ambulatory Visit | Attending: Pulmonary Disease | Admitting: Pulmonary Disease

## 2020-08-11 ENCOUNTER — Encounter: Payer: Self-pay | Admitting: Pulmonary Disease

## 2020-08-11 ENCOUNTER — Telehealth: Payer: Self-pay

## 2020-08-11 ENCOUNTER — Other Ambulatory Visit: Payer: Self-pay

## 2020-08-11 ENCOUNTER — Ambulatory Visit: Payer: No Typology Code available for payment source | Admitting: Pulmonary Disease

## 2020-08-11 VITALS — BP 126/78 | HR 78 | Temp 97.3°F | Ht 66.0 in | Wt 167.0 lb

## 2020-08-11 DIAGNOSIS — K219 Gastro-esophageal reflux disease without esophagitis: Secondary | ICD-10-CM

## 2020-08-11 DIAGNOSIS — D860 Sarcoidosis of lung: Secondary | ICD-10-CM | POA: Diagnosis not present

## 2020-08-11 DIAGNOSIS — J449 Chronic obstructive pulmonary disease, unspecified: Secondary | ICD-10-CM

## 2020-08-11 DIAGNOSIS — R0602 Shortness of breath: Secondary | ICD-10-CM

## 2020-08-11 DIAGNOSIS — I4510 Unspecified right bundle-branch block: Secondary | ICD-10-CM | POA: Diagnosis not present

## 2020-08-11 DIAGNOSIS — J4489 Other specified chronic obstructive pulmonary disease: Secondary | ICD-10-CM

## 2020-08-11 DIAGNOSIS — Z87891 Personal history of nicotine dependence: Secondary | ICD-10-CM

## 2020-08-11 LAB — CBC WITH DIFFERENTIAL/PLATELET
Abs Immature Granulocytes: 0.02 10*3/uL (ref 0.00–0.07)
Basophils Absolute: 0.1 10*3/uL (ref 0.0–0.1)
Basophils Relative: 1 %
Eosinophils Absolute: 0.5 10*3/uL (ref 0.0–0.5)
Eosinophils Relative: 7 %
HCT: 42.6 % (ref 39.0–52.0)
Hemoglobin: 14 g/dL (ref 13.0–17.0)
Immature Granulocytes: 0 %
Lymphocytes Relative: 17 %
Lymphs Abs: 1.2 10*3/uL (ref 0.7–4.0)
MCH: 30.4 pg (ref 26.0–34.0)
MCHC: 32.9 g/dL (ref 30.0–36.0)
MCV: 92.6 fL (ref 80.0–100.0)
Monocytes Absolute: 0.7 10*3/uL (ref 0.1–1.0)
Monocytes Relative: 10 %
Neutro Abs: 5 10*3/uL (ref 1.7–7.7)
Neutrophils Relative %: 65 %
Platelets: 269 10*3/uL (ref 150–400)
RBC: 4.6 MIL/uL (ref 4.22–5.81)
RDW: 12 % (ref 11.5–15.5)
WBC: 7.5 10*3/uL (ref 4.0–10.5)
nRBC: 0 % (ref 0.0–0.2)

## 2020-08-11 MED ORDER — TRELEGY ELLIPTA 100-62.5-25 MCG/INH IN AEPB: 1.0000 | INHALATION_SPRAY | Freq: Every day | RESPIRATORY_TRACT | 0 refills | Status: DC

## 2020-08-11 MED ORDER — PANTOPRAZOLE SODIUM 40 MG PO TBEC: 40.0000 mg | DELAYED_RELEASE_TABLET | Freq: Every day | ORAL | 2 refills | Status: DC

## 2020-08-11 NOTE — Progress Notes (Signed)
Subjective:    Patient ID: Steve Wang, male    DOB: [birth year], 50 y.o.   MRN: [id number]  Requesting MD/Service: Self, primary care physician Steve Merry, MD Date of initial consultation: 11 August 2020 Reason for consultation: History of sarcoidosis, GERD issues   PT PROFILE: 50 year old former smoker with biopsy-proven sarcoidosis previously diagnosed to manage her [place] [facility] [place].  Patient now with dyspnea on exertion.   DATA: 10/13/2010 CT chest: Report only, perform [facility] [place]: Upper lung predominant parenchymal changes, scarring and traction bronchiectasis with small peribronchial and perifissural nodules.  Enlarged bilateral hilar and mediastinal lymph nodes, findings all suggestive of sarcoidosis. 11/03/2010: ACE level 98, eosinophils elevated 8.1% 11/09/2010 Bronchoscopy specimens: [facility] [place], noncaseating granulomas right upper lobe, fungal culture negative for nocardia, Doratomyces species isolated, contaminant.  AL 63% histiocytes, 33% lymphocytes, 2% neutrophils, 2% eosinophils.  Flow cytometry favored sarcoidosis. 09/21/2013 spirometry: Performed at [facility], FEV1 2.72 L or 76% predicted, FVC 4.42 L or 95% predicted, FEV1/FVC 61%, DLCO moderately reduced.  Consistent with mild obstructive defect (not congruent with sarcoidosis). 09/22/2018 2D echo: Performed in [facility] [place], LVEF 55 to 60% mild LV dilatation, no wall motion abnormalities, otherwise normal.   INTERVAL: New patient here, no interval  HPI Patient is a 50 year old former smoker (quit 2010, 25-pack-year history) who presents for evaluation of dyspnea on exertion.  Self-referred however his primary care physician is Dr. Mila Wang.  The patient carries a diagnosis of sarcoidosis diagnosed by biopsy performed at [facility] [place] in April 2012.  I have reviewed the available records from the [place] [facility] [place] available through Care Everywhere.  The  patient's spirometry throughout his care at [facility] always showed an obstructive defect.  He states that albuterol never helped him.  He never tried any other inhalers to his knowledge.  He was treated with prednisone and methotrexate for years.  He states that around 2015 he was "back to normal".  He notes that his dyspnea is worse going up a flight of stairs or lifting.  He also notes significant gastroesophageal reflux symptoms and heartburn.  He does use nonsteroidals in the form of Motrin and notes that this sometimes relieves symptoms of occasional chest discomfort.  He does not note any joint discomfort.  He has not had any fevers, chills or sweats.  He has noted orthopnea but no paroxysmal nocturnal dyspnea.  No rashes or joint pain.  No lower extremity swelling, no calf tenderness.  He voices no other complaint.  He has been evaluated by cardiology previously for a right bundle branch block.  This was in 2020.  Evaluation at that time was not consistent with cardiac sarcoid.  He was evaluated by Dr. York Wang.   Review of Systems A 10 point review of systems was performed and it is as noted above otherwise negative.  Past Medical History:  Diagnosis Date  . GERD (gastroesophageal reflux disease)   . Sarcoidosis    Patient Active Problem List   Diagnosis Date Noted  . Right bundle branch block 09/10/2018  . GERD (gastroesophageal reflux disease) 06/25/2018  . Sarcoidosis of lung (HCC) 06/25/2018  . Chronic right shoulder pain 06/25/2018  . History of migraine 06/25/2018  . History of kidney stones 06/25/2018   Past Surgical History:  Procedure Laterality Date  . BRONCHOSCOPY  11/09/2010   [facility] [place]   Family History  Problem Relation Age of Onset  . COPD Mother    Social History  Tobacco Use  . Smoking status: Former Smoker    Packs/day: 1.00    Years: 25.00    Pack years: 25.00    Types: Cigarettes    Quit date: 07/23/2008    Years since  quitting: 12.0  . Smokeless tobacco: Current User    Types: Chew  Substance Use Topics  . Alcohol use: Yes    Comment: rarely    No Known Allergies  Current Meds  Medication Sig  . [EXPIRED] Fluticasone-Umeclidin-Vilant (TRELEGY ELLIPTA) 100-62.5-25 MCG/INH AEPB Inhale 1 puff into the lungs daily for 1 day.  . ibuprofen (ADVIL) 200 MG tablet Take 200 mg by mouth every 6 (six) hours as needed.  . pantoprazole (PROTONIX) 40 MG tablet Take 1 tablet (40 mg total) by mouth daily.   Immunization History  Administered Date(s) Administered  . Pneumococcal Polysaccharide-23 06/25/2018  . Tdap 06/25/2018    Objective:   Physical Exam BP 126/78 (BP Location: Left Arm, Cuff Size: Normal)   Pulse 78   Temp (!) 97.3 F (36.3 C) (Temporal)   Ht 5\' 6"  (1.676 m)   Wt 167 lb (75.8 kg)   SpO2 98%   BMI 26.95 kg/m  GENERAL: Well-developed, well-nourished gentleman, no acute distress, fully ambulatory, no conversational dyspnea. HEAD: Normocephalic, atraumatic.  EYES: Pupils equal, round, reactive to light.  No scleral icterus.  MOUTH: Nose/mouth/throat not examined due to masking requirements for COVID 19. NECK: Supple. No thyromegaly. Trachea midline. No JVD.  No adenopathy. PULMONARY: Good air entry bilaterally.  Somewhat coarse breath sounds with end expiratory wheezes otherwise, no adventitious sounds.  CARDIOVASCULAR: S1 and S2. Regular rate and rhythm.  No rubs, murmurs or gallops heard. ABDOMEN: Benign. MUSCULOSKELETAL: No joint deformity, no clubbing, no edema.  NEUROLOGIC: No overt focal deficit, no gait disturbance noted.  Speech is fluent. SKIN: Intact,warm,dry.  On limited exam no rashes. PSYCH: Mood and behavior normal.     Assessment & Plan:     ICD-10-CM   1. Sarcoidosis of lung (HCC)  D86.0 CT Chest W Contrast    CBC w/Diff    Allergen Panel (27) + IGE    Angiotensin converting enzyme    Pulmonary Function Test [facility] Only   Will review in detail [facility]  records This appears to be quiescent Check ACE level Check PFTs   2. 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 RBBB  I45.10    Evaluated by cardiology Continue to monitor  4. Shortness of breath  R06.02 ECHOCARDIOGRAM COMPLETE   PFTs 2D echo to evaluate for potential pulmonary hypertension  5. Gastroesophageal reflux disease, unspecified whether esophagitis present  K21.9    Protonix 40 mg daily Avoid nonsteroidals Antireflux measures  6. Former smoker  Z87.891    Has not relapsed Quit 2010   Orders Placed This Encounter  Procedures  . CT Chest W Contrast    Standing Status:   Future    Standing Expiration Date:   08/11/2021    Order Specific Question:   If indicated for the ordered procedure, I authorize the administration of contrast media per Radiology protocol    Answer:   Yes    Order Specific Question:   Preferred imaging location?    Answer:   [facility]  . CBC w/Diff    Standing Status:   Future    Number of Occurrences:   1  Standing Expiration Date:   08/11/2021  . Allergen Panel (27) + IGE    Standing Status:   Future    Number of Occurrences:   1    Standing Expiration Date:   08/11/2021  . Angiotensin converting enzyme    Standing Status:   Future    Number of Occurrences:   1    Standing Expiration Date:   08/11/2021  . Pulmonary Function Test [facility] Only    Standing Status:   Future    Standing Expiration Date:   08/11/2021    Scheduling Instructions:     Next available.    Order Specific Question:   Full PFT: includes the following: basic spirometry, spirometry pre & post bronchodilator, diffusion capacity (DLCO), lung volumes    Answer:   Full PFT  . ECHOCARDIOGRAM COMPLETE    Standing Status:   Future    Standing Expiration Date:   02/08/2021    Order Specific Question:   Where should this test be performed     Answer:   [facility]    Order Specific Question:   Please indicate who you request to read the nuc med / echo results.    Answer:   [facility]    Order Specific Question:   Perflutren DEFINITY (image enhancing agent) should be administered unless hypersensitivity or allergy exist    Answer:   Administer Perflutren    Order Specific Question:   Reason for exam-Echo    Answer:   Dyspnea  R06.00   Meds ordered this encounter  Medications  . pantoprazole (PROTONIX) 40 MG tablet    Sig: Take 1 tablet (40 mg total) by mouth daily.    Dispense:  30 tablet    Refill:  2  . Fluticasone-Umeclidin-Vilant (TRELEGY ELLIPTA) 100-62.5-25 MCG/INH AEPB    Sig: Inhale 1 puff into the lungs daily for 1 day.    Dispense:  14 each    Refill:  0    Order Specific Question:   Lot Number?    Answer:   [id number]    Order Specific Question:   Expiration Date?    Answer:   03/23/2022    Order Specific Question:   Manufacturer?    Answer:   [employer] [12]    Order Specific Question:   Quantity    Answer:   1   Discussion:  Patient has a longstanding history of sarcoidosis.  It appears that this left him with significant scarring.  He has evidence of right bundle branch block but no other evidence of potential sarcoid involvement in the heart.  He presents with worsening dyspnea which by clinical exam appears more related to asthma/COPD overlap syndrome.  Will obtain PFTs and 2D echo to evaluate for these issues.  Patients with sarcoidosis are sometimes prone to pulmonary hypertension and 2D echo should be helpful with this.  Orders as noted above.  We will see the patient in follow-up in 4-6 weeks time, he is to contact us prior to that time should any new difficulties arise.  We will in the interim continue to review his extensive [place] [facility] [place] records.   Gailen Shelter, MD [facility]   *This note was dictated using voice recognition software/Dragon.  Despite best efforts to  proofread, errors can occur which can change the meaning.  Any change was purely unintentional.

## 2020-08-11 NOTE — Telephone Encounter (Signed)
Records request has been faxed to [facility] [place] pulmonary.

## 2020-08-11 NOTE — Patient Instructions (Addendum)
We are going to do a chest CT to further evaluate her sarcoid  We are going to get your breathing test  We are going to get another echocardiogram to compare to the one from prior  We are getting some blood work to check for allergies and for an ACE level which tells [place] about your sarcoidosis activity  We are going to get records from [place] [facility] [place]  Giving you a trial of a medication for your reflux is Protonix 1 tablet daily  We are giving you a trial of Trelegy 1 puff daily let us know how you do with this and then we can call it to your pharmacy  We will see you in follow-up in 4 to 6 weeks time call sooner should any new problems arise, we will call you with results of tests as they become available.

## 2020-08-12 LAB — ANGIOTENSIN CONVERTING ENZYME: Angiotensin-Converting Enzyme: 61 U/L (ref 14–82)

## 2020-08-12 NOTE — Telephone Encounter (Signed)
Dr. Jayme Cloud, please Advise. Thanks

## 2020-08-12 NOTE — Telephone Encounter (Signed)
Records are available in epic.  Will close encounter.

## 2020-08-12 NOTE — Telephone Encounter (Signed)
Rashes associated with Sarcoid are more nodular (lumpy bumpy) than scaly. This may be more related to allergies but if issues persist we can refer to Derm.

## 2020-08-15 ENCOUNTER — Institutional Professional Consult (permissible substitution): Payer: 59 | Admitting: Pulmonary Disease

## 2020-08-17 LAB — ALLERGEN PANEL (27) + IGE
Alternaria Alternata IgE: <0.1 kU/L
Aspergillus Fumigatus IgE: <0.1 kU/L
Bahia Grass IgE: 0.45 kU/L — AB
Bermuda Grass IgE: 0.17 kU/L — AB
Cat Dander IgE: <0.1 kU/L
Cedar, Mountain IgE: UNDETERMINED kU/L
Cladosporium Herbarum IgE: <0.1 kU/L
Cocklebur IgE: UNDETERMINED kU/L
Cockroach, American IgE: <0.1 kU/L
Common Silver Birch IgE: UNDETERMINED kU/L
D Farinae IgE: 1.73 kU/L — AB
D Pteronyssinus IgE: 3.24 kU/L — AB
Dog Dander IgE: <0.1 kU/L
Elm, American IgE: <0.1 kU/L
Hickory, White IgE: UNDETERMINED kU/L
IgE (Immunoglobulin E), Serum: 42 IU/mL (ref 6–495)
Johnson Grass IgE: 0.27 kU/L — AB
Kentucky Bluegrass IgE: 0.77 kU/L — AB
Maple/Box Elder IgE: UNDETERMINED kU/L
Mucor Racemosus IgE: UNDETERMINED kU/L
Oak, White IgE: <0.1 kU/L
Penicillium Chrysogen IgE: UNDETERMINED kU/L
Pigweed, Rough IgE: <0.1 kU/L
Plantain, English IgE: UNDETERMINED kU/L
Ragweed, Short IgE: 1.99 kU/L — AB
Setomelanomma Rostrat: UNDETERMINED kU/L
Timothy Grass IgE: 0.67 kU/L — AB
White Mulberry IgE: UNDETERMINED kU/L

## 2020-08-17 NOTE — Telephone Encounter (Signed)
So his sarcoid does not appear to be active meaning it is likely in remission.  Have the results of his PFTs, echo and imaging so that I can tell diagnosis.  In the meantime continue using inhaler provided.  We will discuss more in depth on follow-up visit.

## 2020-08-17 NOTE — Telephone Encounter (Signed)
Dr. Gonzalez, please advise. thanks 

## 2020-08-19 ENCOUNTER — Encounter: Payer: Self-pay | Admitting: Pulmonary Disease

## 2020-08-30 ENCOUNTER — Ambulatory Visit (INDEPENDENT_AMBULATORY_CARE_PROVIDER_SITE_OTHER): Payer: No Typology Code available for payment source

## 2020-08-30 ENCOUNTER — Other Ambulatory Visit: Payer: Self-pay

## 2020-08-30 ENCOUNTER — Ambulatory Visit
Admission: RE | Admit: 2020-08-30 | Discharge: 2020-08-30 | Disposition: A | Discharge status: Home / Self Care | Payer: No Typology Code available for payment source | Source: Ambulatory Visit | Attending: Pulmonary Disease | Admitting: Pulmonary Disease

## 2020-08-30 DIAGNOSIS — D860 Sarcoidosis of lung: Secondary | ICD-10-CM | POA: Diagnosis not present

## 2020-08-30 DIAGNOSIS — R0602 Shortness of breath: Secondary | ICD-10-CM

## 2020-08-30 MED ORDER — IOHEXOL 300 MG/ML  SOLN
75.0000 mL | Freq: Once | INTRAMUSCULAR | Status: AC | PRN
  Administered 2020-08-30: 75 mL via INTRAVENOUS

## 2020-08-31 LAB — ECHOCARDIOGRAM COMPLETE
Area-P 1/2: 3.05 cm2
S' Lateral: 5.2 cm
Single Plane A4C EF: 45.6 %

## 2020-09-01 ENCOUNTER — Other Ambulatory Visit: Payer: Self-pay

## 2020-09-01 ENCOUNTER — Telehealth: Payer: Self-pay | Admitting: Pulmonary Disease

## 2020-09-01 DIAGNOSIS — R931 Abnormal findings on diagnostic imaging of heart and coronary circulation: Secondary | ICD-10-CM

## 2020-09-01 DIAGNOSIS — R911 Solitary pulmonary nodule: Secondary | ICD-10-CM

## 2020-09-01 MED ORDER — TRELEGY ELLIPTA 100-62.5-25 MCG/INH IN AEPB: 1.0000 | INHALATION_SPRAY | Freq: Every day | RESPIRATORY_TRACT | 11 refills | Status: AC

## 2020-09-01 NOTE — Telephone Encounter (Signed)
Spoke to patient, who had additional questions regarding his CT results. All questions have been answered.  Patient voiced his understanding and had no further questions.  Nothing further needed.

## 2020-09-02 NOTE — Telephone Encounter (Signed)
Patient is questioning if decreased heart function can be related to sarcoid as well.  Dr. Jayme Cloud, please advise. Thanks

## 2020-09-02 NOTE — Telephone Encounter (Signed)
Patient is questioning if he should see oncology for the small nodule found on CT?  Dr. Jayme Cloud, please advise.

## 2020-09-05 NOTE — Telephone Encounter (Signed)
The reason why I did the echo is because yes sarcoidosis can affect the heart.  However this cannot be for certain until he gets evaluated by cardiology and he more than likely will need a cardiac MRI.  That is the reason why I am referring him to cardiology so we can sort out these issues.

## 2020-09-29 NOTE — Progress Notes (Signed)
Cardiology Office Note  Date:  09/30/2020   ID:  [name], DOB 11/10/1970, MRN [id number]  PCP:  Malva Limes, MD   Chief Complaint  Patient presents with  . New Patient (Initial Visit)    Ref by Dr. Jayme Cloud for abnormal Echo & EKG.  Medications reviewed by the patient verbally. Patient c/o shortness of breath with over exertion and chest pain as well as anxiety.     HPI:  [name] is a 50 year old gentleman with past medical history of Pulmonary sarcoidosis, treated in [place] Told he has COPD Referred by Dr. Jayme Cloud for consultation of his abnormal echocardiogram, dilated left ventricle, mildly decreased ejection fraction on echo  remote history of sarcoidosis with pulmonary involvement on steroids and methotrexate remotely at [facility] [place]  10/2010  Recent lab work, ACE level is normal suggesting low to no activity of sarcoid.  Echo 08/2020 images pulled up and reviewed 1. Left ventricular ejection fraction, by estimation, is 45 to 50%. The  left ventricle has mildly decreased function. The left ventricle  demonstrates global hypokinesis. The left ventricular internal cavity size  was severely dilated. Left ventricular  diastolic parameters are consistent with Grade II diastolic dysfunction  (pseudonormalization).  2. Right ventricular systolic function is low normal. The right  ventricular size is normal.  3. The mitral valve is normal in structure. Trivial mitral valve  regurgitation. No evidence of mitral stenosis.  4. The aortic valve is tricuspid. Aortic valve regurgitation is not  visualized. No aortic stenosis is present.    Prior echo 09/2018, images pulled up and reviewed Normal EF 55-60%  CT chest 1. Perihilar predominant traction bronchiectasis, parenchymal retraction and architectural distortion, findings in keeping with the given history of sarcoid. 2. Left ventricular dilatation. 3. 3 mm left lower lobe nodule. No follow-up  needed if patient is low-risk. Non-contrast chest CT can be considered in 12 months if patient is high-risk. This recommendation follows the consensus statement: Guidelines for Management of Incidental Pulmonary Nodules Detected on CT Images: From the Fleischner Society 2017; Radiology 2017; 284:228-243. 4.  Emphysema (ICD10-J43.9).  EKG personally reviewed by myself on todays visit NSR rate 68 bpm RBBB   PMH:   has a past medical history of GERD (gastroesophageal reflux disease) and Sarcoidosis.  PSH:    Past Surgical History:  Procedure Laterality Date  . BRONCHOSCOPY  11/09/2010   [facility] [place]    Current Outpatient Medications  Medication Sig Dispense Refill  . Fluticasone-Umeclidin-Vilant (TRELEGY ELLIPTA) 100-62.5-25 MCG/INH AEPB Inhale 1 puff into the lungs daily.    Marland Kitchen ibuprofen (ADVIL) 200 MG tablet Take 200 mg by mouth every 6 (six) hours as needed.    . pantoprazole (PROTONIX) 40 MG tablet Take 1 tablet (40 mg total) by mouth daily. 30 tablet 2   No current facility-administered medications for this visit.    Allergies:   Patient has no known allergies.   Social History:  The patient  reports that he quit smoking about 12 years ago. His smoking use included cigarettes. He has a 25.00 pack-year smoking history. His smokeless tobacco use includes chew. He reports current alcohol use. He reports previous drug use.   Family History:   family history includes COPD in his mother.    Review of Systems: Review of Systems  Constitutional: Negative.   HENT: Negative.   Respiratory: Negative.   Cardiovascular: Negative.   Gastrointestinal: Negative.   Musculoskeletal: Negative.   Neurological: Negative.   Psychiatric/Behavioral: Negative.  All other systems reviewed and are negative.   PHYSICAL EXAM: VS:  BP 110/70 (BP Location: Right Arm, Patient Position: Sitting, Cuff Size: Normal)   Pulse 68   Ht 5\' 6"  (1.676 m)   Wt 158 lb 2 oz (71.7 kg)   SpO2 98%    BMI 25.52 kg/m  , BMI Body mass index is 25.52 kg/m. GEN: Well nourished, well developed, in no acute distress HEENT: normal Neck: no JVD, carotid bruits, or masses Cardiac: RRR; no murmurs, rubs, or gallops,no edema  Respiratory:  clear to auscultation bilaterally, normal work of breathing GI: soft, nontender, nondistended, + BS MS: no deformity or atrophy Skin: warm and dry, no rash Neuro:  Strength and sensation are intact Psych: euthymic mood, full affect  Recent Labs: 08/11/2020: Hemoglobin 14.0; Platelets 269    Lipid Panel Lab Results  Component Value Date   CHOL 155 06/25/2018   HDL 57 06/25/2018   LDLCALC 84 06/25/2018   TRIG 70 06/25/2018      Wt Readings from Last 3 Encounters:  09/30/20 158 lb 2 oz (71.7 kg)  08/11/20 167 lb (75.8 kg)  09/10/18 157 lb (71.2 kg)     ASSESSMENT AND PLAN:  Problem List Items Addressed This Visit    Sarcoidosis of lung (HCC) - Primary    Other Visit Diagnoses    Dilated cardiomyopathy (HCC)         Dilated cardiomyopathy Difficult to visualize echocardiogram images, unable to detect myocardial border on echo to estimate ejection fraction accurately Given mildly dilated LV and inaccuracies with ejection fraction, history of sarcoid and concern for infiltrate by history, we have ordered cardiac MRI -No medication started until data obtained -Medication options may be limited given low blood pressure, 110 systolic today  Chest pain Very active at baseline, some atypical features Cardiac MRI as above, For any data on cardiac MRI concerning for underlying coronary disease and ischemia, may need cardiac CTA  Right bundle-branch block Noted previously, Likely unrelated to above    Total encounter time more than 09/12/18  Greater than 50% was spent in counseling and coordination of care with the patient  Patient seen in consultation for Dr. and will be referred back to her office for ongoing follow-up of the  issues detailed above   Signed, Jayme Cloud, M.D., Ph.D. [facility] [place], [place]

## 2020-09-30 ENCOUNTER — Telehealth: Payer: Self-pay | Admitting: Cardiovascular Disease

## 2020-09-30 ENCOUNTER — Encounter: Payer: Self-pay | Admitting: Cardiovascular Disease

## 2020-09-30 ENCOUNTER — Other Ambulatory Visit: Payer: Self-pay

## 2020-09-30 ENCOUNTER — Ambulatory Visit: Payer: No Typology Code available for payment source | Admitting: Cardiovascular Disease

## 2020-09-30 VITALS — BP 110/70 | HR 68 | Ht 66.0 in | Wt 158.1 lb

## 2020-09-30 DIAGNOSIS — R931 Abnormal findings on diagnostic imaging of heart and coronary circulation: Secondary | ICD-10-CM

## 2020-09-30 DIAGNOSIS — R0602 Shortness of breath: Secondary | ICD-10-CM

## 2020-09-30 DIAGNOSIS — D860 Sarcoidosis of lung: Secondary | ICD-10-CM

## 2020-09-30 DIAGNOSIS — I420 Dilated cardiomyopathy: Secondary | ICD-10-CM | POA: Diagnosis not present

## 2020-09-30 DIAGNOSIS — I4510 Unspecified right bundle-branch block: Secondary | ICD-10-CM | POA: Diagnosis not present

## 2020-09-30 NOTE — Telephone Encounter (Signed)
Left message for patient to call and discuss scheduling the Cardiac MRI ordered by Dr. Mariah Milling

## 2020-09-30 NOTE — Patient Instructions (Addendum)
Medication Instructions:  No changes  Lab work: No new labs needed  Testing/Procedures: Cardiac MRI Please arrive at the [facility] main entrance of [facility] at (30-45 minutes prior to test start time). ?   [facility]  [address]  [place], [place] [address]  [phone]  Proceed to the [facility] Radiology Department (First Floor).  ?  Magnetic resonance imaging (MRI) is a painless test that produces images of the inside of the body without using X-rays. During an MRI, strong magnets and radio waves work together in a Data processing manager to form detailed images. MRI images may provide more details about a medical condition than X-rays, CT scans, and ultrasounds can provide.    You may be given earphones to listen for instructions.   You may eat a light breakfast and take medications as ordered   If a contrast material will be used, an IV will be inserted into one of your veins. Contrast material will be injected into your IV.   You will be asked to remove all metal, including: Watch, jewelry, and other metal objects including hearing aids, hair pieces and dentures. (Braces and fillings normally are not a problem.)   If contrast material was used:   It will leave your body through your urine within a day. You may be told to drink plenty of fluids to help flush the contrast material out of your system.   TEST WILL TAKE APPROXIMATELY 1 HOUR   PLEASE NOTIFY SCHEDULING AT LEAST 24 HOURS IN ADVANCE IF YOU ARE UNABLE TO KEEP YOUR APPOINTMENT.     Follow-Up:  . You will need a follow up appointment as needed (call to schedule after MRI appt)  . Providers on your designated Care Team:   . Nicolasa Ducking, NP . Eula Listen, PA-C . Marisue Ivan, PA-C

## 2020-10-03 NOTE — Telephone Encounter (Signed)
Patient call back to say that he would like to be seen a first available time and date for MRI he will be okay with. Patient states that he can be reach at [phone] or at [phone]

## 2020-10-03 NOTE — Telephone Encounter (Signed)
Spoke with patient regarding preferred weekdays and times for scheduling the Cardiac MIRI ordered by Dr. Mariah Milling.   Informed patient as soon as we hear regarding the insurance prior authorization, I will be in touch with his appointment.  Patient voiced his understanding.

## 2020-10-04 ENCOUNTER — Encounter: Payer: Self-pay | Admitting: Cardiovascular Disease

## 2020-10-04 ENCOUNTER — Other Ambulatory Visit: Payer: Self-pay

## 2020-10-04 DIAGNOSIS — R0602 Shortness of breath: Secondary | ICD-10-CM

## 2020-10-04 DIAGNOSIS — R931 Abnormal findings on diagnostic imaging of heart and coronary circulation: Secondary | ICD-10-CM

## 2020-10-04 DIAGNOSIS — D860 Sarcoidosis of lung: Secondary | ICD-10-CM

## 2020-10-04 DIAGNOSIS — I420 Dilated cardiomyopathy: Secondary | ICD-10-CM

## 2020-10-04 DIAGNOSIS — I4510 Unspecified right bundle-branch block: Secondary | ICD-10-CM

## 2020-10-04 NOTE — Progress Notes (Signed)
MRI schedule for 10/24/20 at 9am, [facility]. Order was switch for "with and without contrast"

## 2020-10-04 NOTE — Telephone Encounter (Signed)
Spoke with patient regarding the Monday 10/24/20 9:00 am Cardiac MRI appointment at [facility]  Arrival time is 8:30am 1st floor admissions office for check in.  Will mail information to patient and it is also in My Chart.  Patient voiced his understanding.

## 2020-10-07 NOTE — Progress Notes (Signed)
Appreciate input! 

## 2020-10-21 ENCOUNTER — Telehealth (HOSPITAL_COMMUNITY): Payer: Self-pay | Admitting: Emergency Medicine

## 2020-10-21 NOTE — Telephone Encounter (Signed)
Attempted to call patient regarding upcoming cardiac CT appointment. °Left message on voicemail with name and callback number °Artem Bunte RN Navigator Cardiac Imaging °[facility] °[phone] Office °[phone] Cell ° °

## 2020-10-24 ENCOUNTER — Other Ambulatory Visit: Payer: Self-pay

## 2020-10-24 ENCOUNTER — Ambulatory Visit (HOSPITAL_COMMUNITY)
Admission: RE | Admit: 2020-10-24 | Discharge: 2020-10-24 | Disposition: A | Discharge status: Home / Self Care | Payer: No Typology Code available for payment source | Source: Ambulatory Visit | Attending: Cardiovascular Disease | Admitting: Cardiovascular Disease

## 2020-10-24 DIAGNOSIS — I4510 Unspecified right bundle-branch block: Secondary | ICD-10-CM | POA: Insufficient documentation

## 2020-10-24 DIAGNOSIS — D860 Sarcoidosis of lung: Secondary | ICD-10-CM

## 2020-10-24 DIAGNOSIS — R0602 Shortness of breath: Secondary | ICD-10-CM | POA: Diagnosis present

## 2020-10-24 DIAGNOSIS — R931 Abnormal findings on diagnostic imaging of heart and coronary circulation: Secondary | ICD-10-CM | POA: Insufficient documentation

## 2020-10-24 DIAGNOSIS — I420 Dilated cardiomyopathy: Secondary | ICD-10-CM | POA: Diagnosis not present

## 2020-10-24 MED ORDER — GADOBUTROL 1 MMOL/ML IV SOLN
10.0000 mL | Freq: Once | INTRAVENOUS | Status: AC | PRN
  Administered 2020-10-24: 8 mL via INTRAVENOUS

## 2020-10-28 ENCOUNTER — Telehealth: Payer: Self-pay

## 2020-10-28 DIAGNOSIS — R9430 Abnormal result of cardiovascular function study, unspecified: Secondary | ICD-10-CM

## 2020-10-28 DIAGNOSIS — I4510 Unspecified right bundle-branch block: Secondary | ICD-10-CM

## 2020-10-28 DIAGNOSIS — I519 Heart disease, unspecified: Secondary | ICD-10-CM

## 2020-10-28 MED ORDER — METOPROLOL SUCCINATE ER 25 MG PO TB24: 12.5000 mg | ORAL_TABLET | Freq: Every day | ORAL | 3 refills | Status: DC

## 2020-10-28 MED ORDER — LOSARTAN POTASSIUM 25 MG PO TABS: 12.5000 mg | ORAL_TABLET | Freq: Every day | ORAL | 3 refills | Status: DC

## 2020-10-28 NOTE — Telephone Encounter (Signed)
Attempted to reach pt via phone, LDM on VM (DPR approved), pt has reviewed results from Dr. Mariah Milling on MyChart, advised if any questions or concerns then please call the clinic for further discussion.    Also sent a MyChart message to pt, he had sent one a few days ago stating he was okay to start new medications.   metoprolol succinate 12.5 daily with losartan 12.5 daily were sent in to pharmacy, also placed the referral to advanced heart failure clinic in [facility] for discussion of etiology and management as advised by Dr. Mariah Milling

## 2020-10-28 NOTE — Telephone Encounter (Signed)
Patient returning call.

## 2020-10-28 NOTE — Telephone Encounter (Signed)
Was able to return pt's phone call, went over echo results, medicants reviewed, all questions and concerns address, will see pt at f/u appt next Friday 4/15.

## 2020-11-03 NOTE — Progress Notes (Signed)
Cardiology Office Note  Date:  11/04/2020   ID:  [name], DOB 1971/01/18, MRN [id number]  PCP:  Malva Limes, MD   Chief Complaint  Patient presents with  . Follow-up    Discuss MRI results. Patient c/o dizziness, shortness of breath & chest pain. Medications reviewed by the patient verbally.     HPI:  [name] is a 50 year old gentleman with past medical history of Pulmonary sarcoidosis, treated in [place] Told he has COPD PNA: 05/2018 04/2019 and 07/2020 with COVID Who presents for follow-up of his cardiomyopathy, ejection fraction 45% on echo, down to 36% on MRI with dilated LV  On prior office visit September 30, 2020, ejection fraction discussed, 45 to 50%  Cardiac MRI ordered Mild to moderately dilated Left ventricular size (LVIDD 6.3cm), normal LV thickness. Moderately reduced LV systolic function (LVEF = 36%). 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  Echo 08/2020 images pulled up and reviewed 1. Left ventricular ejection fraction, by estimation, is 45 to 50%. The  left ventricle has mildly decreased function. The left ventricle  demonstrates global hypokinesis. The left ventricular internal cavity size  was severely dilated. Left ventricular  diastolic parameters are  consistent with Grade II diastolic dysfunction  (pseudonormalization).  2. Right ventricular systolic function is low normal. The right  ventricular size is normal.  3. The mitral valve is normal in structure. Trivial mitral valve  regurgitation. No evidence of mitral stenosis.  4. The aortic valve is tricuspid. Aortic valve regurgitation is not  visualized. No aortic stenosis is present.   echo 09/2018, images pulled up and reviewed Normal EF 55-60%  CT chest 1. Perihilar predominant traction bronchiectasis, parenchymal retraction and architectural distortion, findings in keeping with the given history of sarcoid. 2. Left ventricular dilatation. 3. 3 mm left lower lobe nodule. No follow-up needed if patient is low-risk. Non-contrast chest CT can be considered in 12 months if patient is high-risk. This recommendation follows the consensus statement: Guidelines for Management of Incidental Pulmonary Nodules Detected on CT Images: From the Fleischner Society 2017; Radiology 2017; 284:228-243. 4.  Emphysema (ICD10-J43.9).    PMH:   has a past medical history of GERD (gastroesophageal reflux disease) and Sarcoidosis.  PSH:    Past Surgical History:  Procedure Laterality Date  . BRONCHOSCOPY  11/09/2010   [facility]    Current Outpatient Medications  Medication Sig Dispense Refill  . Fluticasone-Umeclidin-Vilant (TRELEGY ELLIPTA) 100-62.5-25 MCG/INH AEPB Inhale 1 puff into the lungs daily.    Marland Kitchen ibuprofen (ADVIL) 200 MG tablet Take 200 mg by mouth every 6 (six) hours as needed.    Marland Kitchen losartan (COZAAR) 25 MG tablet Take 0.5 tablets (12.5 mg total) by mouth daily. 46 tablet 3  . metoprolol succinate (TOPROL XL) 25 MG 24 hr tablet Take 0.5 tablets (12.5 mg  total) by mouth daily. 46 tablet 3  . pantoprazole (PROTONIX) 40 MG tablet Take 1 tablet (40 mg total) by mouth daily. 30 tablet 2   No current facility-administered medications for this visit.    Allergies:    Patient has no known allergies.   Social History:  The patient  reports that he quit smoking about 12 years ago. His smoking use included cigarettes. He has a 25.00 pack-year smoking history. His smokeless tobacco use includes chew. He reports current alcohol use. He reports previous drug use.   Family History:   family history includes COPD in his mother.    Review of Systems: Review of Systems  Constitutional: Negative.   HENT: Negative.   Respiratory: Negative.   Cardiovascular: Positive for chest pain.  Gastrointestinal: Negative.   Musculoskeletal: Negative.   Neurological: Negative.   Psychiatric/Behavioral: Negative.   All other systems reviewed and are negative.   PHYSICAL EXAM: VS:  BP 120/72 (BP Location: Left Arm, Patient Position: Sitting, Cuff Size: Normal)   Pulse (!) 57   Ht 5\' 6"  (1.676 m)   Wt 161 lb 8 oz (73.3 kg)   SpO2 99%   BMI 26.07 kg/m  , BMI Body mass index is 26.07 kg/m. GEN: Well nourished, well developed, in no acute distress HEENT: normal Neck: no JVD, carotid bruits, or masses Cardiac: RRR; no murmurs, rubs, or gallops,no edema  Respiratory:  clear to auscultation bilaterally, normal work of breathing GI: soft, nontender, nondistended, + BS MS: no deformity or atrophy Skin: warm and dry, no rash Neuro:  Strength and sensation are intact Psych: euthymic mood, full affect  Recent Labs: 08/11/2020: Hemoglobin 14.0; Platelets 269    Lipid Panel Lab Results  Component Value Date   CHOL 155 06/25/2018   HDL 57 06/25/2018   LDLCALC 84 06/25/2018   TRIG 70 06/25/2018      Wt Readings from Last 3 Encounters:  11/04/20 161 lb 8 oz (73.3 kg)  09/30/20 158 lb 2 oz (71.7 kg)  08/11/20 167 lb (75.8 kg)     ASSESSMENT AND PLAN:  Problem List Items Addressed This Visit    Sarcoidosis of lung (HCC)    Other Visit Diagnoses    Dilated cardiomyopathy (HCC)    -  Primary   Relevant Orders   EKG 12-Lead   Shortness of breath          Dilated cardiomyopathy COVID x2, subsequently now with dilated cardiomyopathy EF low on MRI 36% Will change losartan to entresto 24/26 BID Stay on metoprolol 12.5 daily --cardiac CTA for ischemic workup MRI with no sarcoid Active at baseline, will hold off on cardiac rehab Consider SGLT2 in follow-up  Chest pain Cardiac CTA ordered for ischemic work up  Right bundle-branch block Noted previously   Total encounter time more than 25 minutes  Greater than 50% was spent in counseling and coordination of care with the patient   Signed, 08/13/20, M.D., Ph.D. [facility] [place], [place]

## 2020-11-04 ENCOUNTER — Other Ambulatory Visit: Payer: Self-pay

## 2020-11-04 ENCOUNTER — Encounter: Payer: Self-pay | Admitting: Cardiovascular Disease

## 2020-11-04 ENCOUNTER — Telehealth: Payer: Self-pay | Admitting: *Deleted

## 2020-11-04 ENCOUNTER — Ambulatory Visit (INDEPENDENT_AMBULATORY_CARE_PROVIDER_SITE_OTHER): Payer: No Typology Code available for payment source | Admitting: Cardiovascular Disease

## 2020-11-04 VITALS — BP 120/72 | HR 57 | Ht 66.0 in | Wt 161.5 lb

## 2020-11-04 DIAGNOSIS — R0602 Shortness of breath: Secondary | ICD-10-CM

## 2020-11-04 DIAGNOSIS — Z01818 Encounter for other preprocedural examination: Secondary | ICD-10-CM

## 2020-11-04 DIAGNOSIS — D860 Sarcoidosis of lung: Secondary | ICD-10-CM | POA: Diagnosis not present

## 2020-11-04 DIAGNOSIS — R079 Chest pain, unspecified: Secondary | ICD-10-CM

## 2020-11-04 DIAGNOSIS — I420 Dilated cardiomyopathy: Secondary | ICD-10-CM | POA: Diagnosis not present

## 2020-11-04 DIAGNOSIS — I209 Angina pectoris, unspecified: Secondary | ICD-10-CM

## 2020-11-04 MED ORDER — ENTRESTO 24-26 MG PO TABS: 1.0000 | ORAL_TABLET | Freq: Two times a day (BID) | ORAL | 3 refills | Status: DC

## 2020-11-04 NOTE — Patient Instructions (Addendum)
Medication Instructions:  START   entresto 24/26 mg twice a day   CO-pay card given  Free sample (1 bottle)  Lot: [id number]  Exp: 10/2022  Can go online to print out a patient assistance application if medication is too muc STOP  losartan  Stay on metoprolol  Lab work: BMP  [facility] to have this done ONCE your cardiac CT has been schedule  Testing/Procedures: Cardiac CTA (cardiomyopathy, chest pain)   [facility] [address] [place], [place] [address] [phone]   If scheduled at [facility], please arrive 15 mins early for check-in and test prep.  Please follow these instructions carefully (unless otherwise directed):  Hold all erectile dysfunction medications at least 3 days (72 hrs) prior to test.  On the Night Before the Test: . Be sure to Drink plenty of water. . Do not consume any caffeinated/decaffeinated beverages or chocolate 12 hours prior to your test. . Do not take any antihistamines 12 hours prior to your test.  On the Day of the Test: . Drink plenty of water until 1 hour prior to the test. . Do not eat any food 4 hours prior to the test. . You may take your regular medications prior to the test.  . Take your metoprolol o Metoprolol succinate 12.5 mg (per Dr. Mariah Milling) in the am        After the Test: . Drink plenty of water. . After receiving IV contrast, you may experience a mild flushed feeling. This is normal. . On occasion, you may experience a mild rash up to 24 hours after the test. This is not dangerous. If this occurs, you can take Benadryl 25 mg and increase your fluid intake. . If you experience trouble breathing, this can be serious. If it is severe call 911 IMMEDIATELY. If it is mild, please call our office. . If you take any of these medications: Glipizide/Metformin, Avandament, Glucavance, please do not take 48 hours after completing test unless  otherwise instructed.   Once we have confirmed authorization from your insurance company, we will call you to set up a date and time for your test. Based on how quickly your insurance processes prior authorizations requests, please allow up to 4 weeks to be contacted for scheduling your Cardiac CT appointment. Be advised that routine Cardiac CT appointments could be scheduled as many as 8 weeks after your provider has ordered it.  For scheduling needs, including cancellations and rescheduling, please call [place], [phone].    Follow-Up:  . You will need a follow up appointment in 3 months  . Providers on your designated Care Team:   . Nicolasa Ducking, NP . Eula Listen, PA-C . Marisue Ivan, PA-C  COVID-19 Vaccine Information can be found at: [email or web address] For questions related to vaccine distribution or appointments, please email [email or web address] or call [phone].

## 2020-11-04 NOTE — Telephone Encounter (Signed)
Pt requiring PA for Entresto 24-26 mg tablet.  PA has been submitted via covermymeds.  Awaiting Response.  [employer] is reviewing your PA request. Typically an electronic response will be received within 24-72 hours. To check for an update later, open this request from your dashboard.

## 2020-11-09 ENCOUNTER — Other Ambulatory Visit: Payer: Self-pay | Admitting: Pulmonary Disease

## 2020-12-14 ENCOUNTER — Telehealth (HOSPITAL_COMMUNITY): Payer: Self-pay | Admitting: Emergency Medicine

## 2020-12-14 NOTE — Telephone Encounter (Signed)
Reaching out to patient to offer assistance regarding upcoming cardiac imaging study; pt verbalizes understanding of appt date/time, parking situation and where to check in, pre-test NPO status and medications ordered, and verified current allergies; name and call back number provided for further questions should they arise Avangeline Stockburger RN Navigator Cardiac Imaging [facility] [phone] office [phone] cell 

## 2020-12-15 ENCOUNTER — Ambulatory Visit
Admission: RE | Admit: 2020-12-15 | Discharge: 2020-12-15 | Disposition: A | Discharge status: Home / Self Care | Payer: No Typology Code available for payment source | Source: Ambulatory Visit | Attending: Cardiovascular Disease | Admitting: Cardiovascular Disease

## 2020-12-15 ENCOUNTER — Other Ambulatory Visit: Payer: Self-pay

## 2020-12-15 DIAGNOSIS — I420 Dilated cardiomyopathy: Secondary | ICD-10-CM | POA: Diagnosis present

## 2020-12-15 DIAGNOSIS — R079 Chest pain, unspecified: Secondary | ICD-10-CM | POA: Insufficient documentation

## 2020-12-15 MED ORDER — NITROGLYCERIN 0.4 MG SL SUBL: 0.8000 mg | SUBLINGUAL_TABLET | Freq: Once | SUBLINGUAL | Status: DC

## 2020-12-15 MED ORDER — IOHEXOL 350 MG/ML SOLN
75.0000 mL | Freq: Once | INTRAVENOUS | Status: AC | PRN
  Administered 2020-12-15: 75 mL via INTRAVENOUS

## 2020-12-15 MED ORDER — NITROGLYCERIN 0.4 MG SL SUBL
0.4000 mg | SUBLINGUAL_TABLET | Freq: Once | SUBLINGUAL | Status: AC
  Administered 2020-12-15: 0.4 mg via SUBLINGUAL

## 2020-12-15 MED ORDER — METOPROLOL TARTRATE 5 MG/5ML IV SOLN
5.0000 mg | Freq: Once | INTRAVENOUS | Status: AC
  Administered 2020-12-15: 5 mg via INTRAVENOUS

## 2020-12-15 NOTE — Progress Notes (Signed)
Patient tolerated CT well. Drank water after. Vital signs stable encourage to drink water throughout day.Reasons explained and verbalized understanding. Ambulated steady gait.  

## 2020-12-21 ENCOUNTER — Telehealth: Payer: Self-pay

## 2020-12-21 DIAGNOSIS — Z87891 Personal history of nicotine dependence: Secondary | ICD-10-CM

## 2020-12-21 NOTE — Telephone Encounter (Signed)
Dr. Gonzalez, please advise. Thanks 

## 2020-12-21 NOTE — Telephone Encounter (Signed)
Able to reach pt regarding his recent cardiac CTA, Dr. Mariah Milling had a chance to review his results and advised   "No significant coronary calcification noted  There is a nodule in the lung, could consider repeat CT scan in 18 months"   Steve Wang reports has seen his results on MyChart, was aware of the 3 mm nodule, but the new finding of the 1.3 cm. Stated he will be closely monitoring this and would like another scan in about 6 months, reports he feels 18 month is too long of a wait. Advised would have PCP follow this and Dr. Sherrie Mustache could advised of course of plan. Steve Wang verbalized understanding.  Otherwise Steve Wang is thankful for the call,  all questions and concerns were address and no additional concerns at this time. Agreeable to plan, will call back for anything further.

## 2020-12-26 NOTE — Telephone Encounter (Signed)
The area that he is referring to was actually noted on the CT scan performed on February.  Both of these areas are STABLE meaning the nodule and the 1.3 cm area.  This is likely represents a intrapleural lymph node.  The recommendation is to follow-up in 18 to 24 months for follow-up.  I believe that he was referred for lung cancer screening CT program, these are done every year and I recommend that he enroll in the program.

## 2020-12-27 NOTE — Telephone Encounter (Addendum)
He has been referred to the program however he was not answering the calls of the scheduler.  In addition his CT would not be done until February.  Patient would not need a lung screening scan until February as Dr. Jayme Cloud mentioned, in addition, he will not be eligible until he is 50 years of age on 09/30/2021. Will be placed on schedule for lung screening program to contact at that time.

## 2021-01-17 NOTE — Telephone Encounter (Signed)
Noted yes, that was my intention to get him starting at age 50 which would be March.

## 2021-01-25 ENCOUNTER — Telehealth: Payer: Self-pay | Admitting: Pulmonary Disease

## 2021-01-25 NOTE — Telephone Encounter (Signed)
I called the patient today since we have PFT appts in the morning on 01/27/21. 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  Since today is Wednesday and short notice he can't get the Covid Test done tomorrow for the PFT on Friday.  He wanted to ask Dr. Jayme Cloud to see if PFT was still needed since his  Sarcoidosis is in remission.  He is working on his heart issues now.  Would you send him a response through Mychart

## 2021-01-25 NOTE — Telephone Encounter (Signed)
Dr. Gonzalez, please advise. Thanks 

## 2021-01-26 NOTE — Telephone Encounter (Signed)
He should have PFTs since he has not had PFTs in quite a while (last was 2015) because of his smoking I want to assess for issues other than sarcoidosis (i.e. COPD).  However, these can be rescheduled at his convenience just specify what days he would prefer at the time of scheduling.  Continue follow-up with cardiology as he is doing.

## 2021-01-26 NOTE — Telephone Encounter (Signed)
Lm for patient.  

## 2021-01-27 NOTE — Telephone Encounter (Signed)
Lm x2 for patient.  Will close encounter per office protocol.   

## 2021-02-05 NOTE — Progress Notes (Deleted)
Cardiology Office Note  Date:  02/05/2021   ID:  [name], DOB Oct 09, 1970, MRN [id number]  PCP:  Malva Limes, MD   No chief complaint on file.   HPI:  [name] is a 50 year old gentleman with past medical history of Pulmonary sarcoidosis, treated in [place] Told he has COPD PNA: 05/2018 04/2019 and 07/2020 with COVID Who presents for follow-up of his cardiomyopathy, ejection fraction 45% on echo, down to 36% on MRI with dilated LV  On prior office visit September 30, 2020, ejection fraction discussed, 45 to 50%  Cardiac MRI ordered Mild to moderately dilated Left ventricular size (LVIDD 6.3cm), normal LV thickness. Moderately reduced LV systolic function (LVEF = 36%). 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  Echo 08/2020 images pulled up and reviewed  1. Left ventricular ejection fraction, by estimation, is 45 to 50%. The  left ventricle has mildly decreased function. The left ventricle  demonstrates global hypokinesis. The left ventricular internal cavity size  was severely dilated. Left ventricular  diastolic parameters are consistent with Grade II diastolic dysfunction  (pseudonormalization).   2. Right ventricular systolic function is low normal. The right  ventricular size  is normal.   3. The mitral valve is normal in structure. Trivial mitral valve  regurgitation. No evidence of mitral stenosis.   4. The aortic valve is tricuspid. Aortic valve regurgitation is not  visualized. No aortic stenosis is present.   echo 09/2018, images pulled up and reviewed Normal EF 55-60%  CT chest 1. Perihilar predominant traction bronchiectasis, parenchymal retraction and architectural distortion, findings in keeping with the given history of sarcoid. 2. Left ventricular dilatation. 3. 3 mm left lower lobe nodule. No follow-up needed if patient is low-risk. Non-contrast chest CT can be considered in 12 months if patient is high-risk. This recommendation follows the consensus statement: Guidelines for Management of Incidental Pulmonary Nodules Detected on CT Images: From the Fleischner Society 2017; Radiology 2017; 284:228-243. 4.  Emphysema (ICD10-J43.9).    PMH:   has a past medical history of GERD (gastroesophageal reflux disease) and Sarcoidosis.  PSH:    Past Surgical History:  Procedure Laterality Date   BRONCHOSCOPY  11/09/2010   [facility]    Current Outpatient Medications  Medication Sig Dispense Refill   Fluticasone-Umeclidin-Vilant (TRELEGY ELLIPTA) 100-62.5-25 MCG/INH AEPB Inhale 1 puff into the lungs daily. (Patient not taking: Reported on 12/15/2020)     ibuprofen (ADVIL) 200 MG tablet Take 200 mg by mouth every 6 (six) hours as needed.     metoprolol succinate (TOPROL XL) 25 MG 24 hr tablet Take 0.5 tablets (12.5 mg total) by mouth daily. 46 tablet 3   pantoprazole (PROTONIX) 40 MG tablet TAKE 1 TABLET BY MOUTH EVERY DAY 90 tablet 2   sacubitril-valsartan (ENTRESTO) 24-26 MG Take 1 tablet by mouth 2 (  two) times daily. 180 tablet 3   No current facility-administered medications for this visit.    Allergies:   Patient has no known allergies.   Social History:  The patient  reports that he quit smoking about 12 years ago. His smoking  use included cigarettes. He has a 25.00 pack-year smoking history. He has quit using smokeless tobacco.  His smokeless tobacco use included chew. He reports previous drug use. He reports that he does not drink alcohol.   Family History:   family history includes COPD in his mother.    Review of Systems: Review of Systems  Constitutional: Negative.   HENT: Negative.    Respiratory: Negative.    Cardiovascular:  Positive for chest pain.  Gastrointestinal: Negative.   Musculoskeletal: Negative.   Neurological: Negative.   Psychiatric/Behavioral: Negative.    All other systems reviewed and are negative.  PHYSICAL EXAM: VS:  There were no vitals taken for this visit. , BMI There is no height or weight on file to calculate BMI. GEN: Well nourished, well developed, in no acute distress HEENT: normal Neck: no JVD, carotid bruits, or masses Cardiac: RRR; no murmurs, rubs, or gallops,no edema  Respiratory:  clear to auscultation bilaterally, normal work of breathing GI: soft, nontender, nondistended, + BS MS: no deformity or atrophy Skin: warm and dry, no rash Neuro:  Strength and sensation are intact Psych: euthymic mood, full affect  Recent Labs: 08/11/2020: Hemoglobin 14.0; Platelets 269    Lipid Panel Lab Results  Component Value Date   CHOL 155 06/25/2018   HDL 57 06/25/2018   LDLCALC 84 06/25/2018   TRIG 70 06/25/2018      Wt Readings from Last 3 Encounters:  11/04/20 161 lb 8 oz (73.3 kg)  09/30/20 158 lb 2 oz (71.7 kg)  08/11/20 167 lb (75.8 kg)     ASSESSMENT AND PLAN:  Problem List Items Addressed This Visit   None Dilated cardiomyopathy COVID x2, subsequently now with dilated cardiomyopathy EF low on MRI 36% Will change losartan to entresto 24/26 BID Stay on metoprolol 12.5 daily --cardiac CTA for ischemic workup MRI with no sarcoid Active at baseline, will hold off on cardiac rehab Consider SGLT2 in follow-up  Chest pain Cardiac CTA ordered for  ischemic work up  Right bundle-branch block Noted previously   Total encounter time more than 25 minutes  Greater than 50% was spent in counseling and coordination of care with the patient   Signed, Dossie Arbour, M.D., Ph.D. [facility] [place], [place] [phone]

## 2021-02-06 ENCOUNTER — Ambulatory Visit: Payer: No Typology Code available for payment source | Admitting: Cardiovascular Disease

## 2021-02-14 ENCOUNTER — Other Ambulatory Visit: Payer: Self-pay

## 2021-02-14 ENCOUNTER — Encounter: Payer: Self-pay | Admitting: Nurse Practitioner

## 2021-02-14 ENCOUNTER — Ambulatory Visit (INDEPENDENT_AMBULATORY_CARE_PROVIDER_SITE_OTHER): Payer: No Typology Code available for payment source | Admitting: Nurse Practitioner

## 2021-02-14 VITALS — BP 100/68 | HR 56 | Ht 66.0 in | Wt 161.0 lb

## 2021-02-14 DIAGNOSIS — I5022 Chronic systolic (congestive) heart failure: Secondary | ICD-10-CM | POA: Diagnosis not present

## 2021-02-14 DIAGNOSIS — I428 Other cardiomyopathies: Secondary | ICD-10-CM | POA: Diagnosis not present

## 2021-02-14 DIAGNOSIS — R42 Dizziness and giddiness: Secondary | ICD-10-CM

## 2021-02-14 DIAGNOSIS — N522 Drug-induced erectile dysfunction: Secondary | ICD-10-CM | POA: Diagnosis not present

## 2021-02-14 MED ORDER — ENTRESTO 24-26 MG PO TABS: 0.5000 | ORAL_TABLET | Freq: Two times a day (BID) | ORAL | Status: DC

## 2021-02-14 NOTE — Patient Instructions (Addendum)
Medication Instructions:  Your physician has recommended you make the following change in your medication:   DECREASE Entresto 24-26 mg and take only one half tablet twice a day.   *If you need a refill on your cardiac medications before your next appointment, please call your pharmacy*   Lab Work: None  If you have labs (blood work) drawn today and your tests are completely normal, you will receive your results only by: MyChart Message (if you have MyChart) OR A paper copy in the mail If you have any lab test that is abnormal or we need to change your treatment, we will call you to review the results.   Testing/Procedures: Your physician has requested that you have an echocardiogram in one month.   Echocardiography is a painless test that uses sound waves to create images of your heart. It provides your doctor with information about the size and shape of your heart and how well your heart's chambers and valves are working. This procedure takes approximately one hour. There are no restrictions for this procedure.    Follow-Up: At [facility], you and your health needs are our priority.  As part of our continuing mission to provide you with exceptional heart care, we have created designated Provider Care Teams.  These Care Teams include your primary Cardiologist (physician) and Advanced Practice Providers (APPs -  Physician Assistants and Nurse Practitioners) who all work together to provide you with the care you need, when you need it.   Your next appointment:   Follow up in September with Dr. Earley Favor  The format for your next appointment:   In Person

## 2021-02-14 NOTE — Progress Notes (Signed)
Office Visit    Patient Name: Steve Wang Date of Encounter: 02/14/2021  Primary Care Provider:  Malva Limes, MD Primary Cardiologist:  Julien Nordmann, MD  Chief Complaint    50 year old male with a history of pulmonary sarcoidosis, pulmonary nodules, remote tobacco abuse, right bundle branch block, nonischemic cardiomyopathy/HFrEF, right bundle branch block, and GERD, who presents for follow-up related to cardiomyopathy.  Past Medical History    Past Medical History:  Diagnosis Date   GERD (gastroesophageal reflux disease)    HFrEF (heart failure with reduced ejection fraction) (HCC)    a. 09/2018 Echo: EF 55-60%; b. 08/2020 Echo: EF 45-50%, grade 2 diastolic dysfunction; c. 10/2020 cMRI: EF 36%, mild to mod dil LV. Mod red RV fxn. No LGE/evidence of sarcoid. No signif valvular dzs   NICM (nonischemic cardiomyopathy) (HCC)    a. 09/2018 Echo: EF 55-60%; b. 08/2020 Echo: EF 45-50%; c. 10/2020 cMRI: EF 36%, mild to mod dil LV. Mod red RV fxn. No LGE/evidence of sarcoid. No signif valvular dzs; d. 12/2020 Cor CTA: Ca2+= 0. Nl cors.   Pulmonary nodule    a. 12/2020 CT chest: 1.3cm posterior basal LUL nodule and 98mm LLL nodule - unchanged.   RBBB    Sarcoidosis    a. Dx 10/2020 in [place] - prev on steroids/methotrexate.   Past Surgical History:  Procedure Laterality Date   BRONCHOSCOPY  11/09/2010   [facility] [place]    Allergies  No Known Allergies  History of Present Illness    50 year old male with the above past medical history including pulmonary sarcoidosis, pulmonary nodules, right bundle branch block, nonischemic cardiomyopathy, HFrEF, right bundle branch block, remote tobacco abuse, and GERD.  Pulmonary sarcoidosis was diagnosed in April 2012 and he received treatment at the [facility] [place] with steroids and methotrexate.  He has since been followed by pulmonology here in [place].  He was previously evaluated cardiology February 2020 in the setting of a  newly diagnosed right bundle branch block.  Echocardiogram at that time showed an EF of 55 to 60%.  More recently, he underwent repeat echocardiography in February 2022, in the setting of dyspnea.  This revealed an EF of 45 to 50% with grade 2 diastolic dysfunction.  Following this, he establish care with Dr. Mariah Milling and underwent cardiac MRI in April showing an EF of 36% with mildly to moderately dilated left ventricle, moderately reduced RV function, and no evidence for cardiac sarcoid or significant valvular disease.  In the setting of a cardiomyopathy, coronary CT angiography was performed in June, showing a calcium score of 0 and normal coronary arteries.  Mr. Sennett was last seen in clinic in April, at which time he was active and doing well.  Losartan was changed to [facility].  He notes that over the past month or 2, he has been experiencing orthostatic lightheadedness and sometimes blood pressures dropping into the 90s at home.  His blood pressure is 100/68 today.  He works in [employer] and spends much of his day outside in the heat with other portions of day upstairs and hot attics.  He thinks he has been hydrating pretty well.  His weight at the end of the day is typically the same as what it was at the beginning of the day.  He is also noticed some erectile dysfunction and difficulty maintaining erection since being on Entresto.  He does note dyspnea with high levels of activity such as sexual intercourse but then was able  to hike to the top of [employer] just last week without any significant limitations.  He denies chest pain, palpitations, PND, orthopnea, syncope, edema, or early satiety.  Home Medications    Current Outpatient Medications  Medication Sig Dispense Refill   ibuprofen (ADVIL) 200 MG tablet Take 200 mg by mouth every 6 (six) hours as needed.     metoprolol succinate (TOPROL XL) 25 MG 24 hr tablet Take 0.5 tablets (12.5 mg total) by mouth daily. 46 tablet 3    sacubitril-valsartan (ENTRESTO) 24-26 MG Take 0.5 tablets by mouth 2 (two) times daily.     Fluticasone-Umeclidin-Vilant (TRELEGY ELLIPTA) 100-62.5-25 MCG/INH AEPB Inhale 1 puff into the lungs daily. (Patient not taking: Reported on 02/14/2021)     No current facility-administered medications for this visit.     Review of Systems    Relatively stable and chronic dyspnea on exertion though this is worse with higher levels of activity such as sexual intercourse.  He has been experiencing orthostatic lightheadedness recently.  He denies chest pain, palpitations, PND, orthopnea, syncope, edema, or early satiety.  All other systems reviewed and are otherwise negative except as noted above.  Physical Exam    VS:  BP 100/68 (BP Location: Left Arm, Patient Position: Sitting, Cuff Size: Normal)   Pulse (!) 56   Ht 5\' 6"  (1.676 m)   Wt 161 lb (73 kg)   SpO2 98%   BMI 25.99 kg/m  , BMI Body mass index is 25.99 kg/m.     GEN: Well nourished, well developed, in no acute distress. HEENT: normal. Neck: Supple, no JVD, carotid bruits, or masses. Cardiac: RRR, no murmurs, rubs, or gallops. No clubbing, cyanosis, edema.  Radials/PT 2+ and equal bilaterally.  Respiratory:  Respirations regular and unlabored, clear to auscultation bilaterally. GI: Soft, nontender, nondistended, BS + x 4. MS: no deformity or atrophy. Skin: warm and dry, no rash. Neuro:  Strength and sensation are intact. Psych: Normal affect.  Accessory Clinical Findings    ECG personally reviewed by me today -sinus bradycardia, 56, right bundle branch block - no acute changes.  Lab Results  Component Value Date   WBC 7.5 08/11/2020   HGB 14.0 08/11/2020   HCT 42.6 08/11/2020   MCV 92.6 08/11/2020   PLT 269 08/11/2020   Lab Results  Component Value Date   CREATININE 0.91 06/25/2018   BUN 10 06/25/2018   NA 140 06/25/2018   K 4.6 06/25/2018   CL 101 06/25/2018   CO2 24 06/25/2018   Lab Results  Component Value Date    ALT 21 06/25/2018   AST 19 06/25/2018   ALKPHOS 65 06/25/2018   BILITOT 0.5 06/25/2018   Lab Results  Component Value Date   CHOL 155 06/25/2018   HDL 57 06/25/2018   LDLCALC 84 06/25/2018   TRIG 70 06/25/2018   CHOLHDL 2.7 06/25/2018     Assessment & Plan    1.  Orthostatic lightheadedness: Over the past month or so, patient has been noticing more orthostatic lightheadedness when moving from a lying or seated to a standing position.  These episodes are generally pretty brief.  He has never felt like he might pass out.  He works both outside and in 14/10/2017 and sweats throughout the day.  He thinks he is hydrating adequately.  His blood pressure today is 100/68.  I suspect his symptoms can be explained by some degree of dehydration and soft blood pressures.  I have encouraged him to hydrate  better and we agreed to reduce his Entresto to half a tablet twice daily (already on the lowest dose).  2.  Nonischemic cardiomyopathy/HFrEF: EF 36% by cardiac MRI in April 2002 without any evidence of sarcoid/LGE.  Coronary calcium score of 0 with normal coronary arteries by coronary CTA in June.  He mostly does not experience dyspnea exertion except for at very high levels of activity such as sexual intercourse.  He is euvolemic on examination today.  As above, reducing Entresto dose due to orthostasis.  Continue current dose of metoprolol (only 12.5 mg daily).  With soft blood pressures, he is not currently a candidate for the addition of MRA or SGLT2 inhibitor, as I suspect both would only contribute to worsening orthostasis.  As he has been on therapy for greater than 40 days, I will arrange for a follow-up echocardiogram to reevaluate LV function.  He is very much interested in knowing if his EF has improved since starting GDMT.  3.  Pulmonary sarcoidosis/COPD: No evidence of cardiac involvement on MRI earlier this year.  He follows up with pulmonology locally.  4.  Erectile dysfunction: Patient  has noted difficulty maintaining erection since being on beta-blocker and Entresto therapy.  We discussed that soft blood pressures is likely playing a role.  He inquired about using Viagra and we agreed to first try reducing his Entresto dose to see if symptoms improve.  I worry with soft blood pressures, that he might experience hypotension with Viagra.  5.  Disposition: Follow-up echo.  Follow-up in clinic in 6 to 8 weeks.  Nicolasa Ducking, NP 02/14/2021, 12:24 PM

## 2021-02-23 ENCOUNTER — Other Ambulatory Visit: Payer: Self-pay

## 2021-02-23 ENCOUNTER — Ambulatory Visit (HOSPITAL_COMMUNITY)
Admission: RE | Admit: 2021-02-23 | Discharge: 2021-02-23 | Disposition: A | Discharge status: Home / Self Care | Payer: No Typology Code available for payment source | Source: Ambulatory Visit | Attending: Cardiology | Admitting: Cardiology

## 2021-02-23 ENCOUNTER — Encounter (HOSPITAL_COMMUNITY): Payer: Self-pay | Admitting: Cardiology

## 2021-02-23 ENCOUNTER — Other Ambulatory Visit (HOSPITAL_COMMUNITY): Payer: Self-pay | Admitting: Cardiology

## 2021-02-23 ENCOUNTER — Other Ambulatory Visit (HOSPITAL_COMMUNITY): Payer: Self-pay

## 2021-02-23 VITALS — BP 106/78 | HR 72 | Wt 163.4 lb

## 2021-02-23 DIAGNOSIS — I5022 Chronic systolic (congestive) heart failure: Secondary | ICD-10-CM | POA: Diagnosis not present

## 2021-02-23 DIAGNOSIS — Z7984 Long term (current) use of oral hypoglycemic drugs: Secondary | ICD-10-CM | POA: Insufficient documentation

## 2021-02-23 DIAGNOSIS — Z7901 Long term (current) use of anticoagulants: Secondary | ICD-10-CM | POA: Diagnosis not present

## 2021-02-23 DIAGNOSIS — Z79899 Other long term (current) drug therapy: Secondary | ICD-10-CM | POA: Insufficient documentation

## 2021-02-23 DIAGNOSIS — J449 Chronic obstructive pulmonary disease, unspecified: Secondary | ICD-10-CM | POA: Insufficient documentation

## 2021-02-23 DIAGNOSIS — Z8616 Personal history of COVID-19: Secondary | ICD-10-CM | POA: Diagnosis not present

## 2021-02-23 DIAGNOSIS — R5383 Other fatigue: Secondary | ICD-10-CM | POA: Diagnosis not present

## 2021-02-23 DIAGNOSIS — R0602 Shortness of breath: Secondary | ICD-10-CM | POA: Insufficient documentation

## 2021-02-23 DIAGNOSIS — R0600 Dyspnea, unspecified: Secondary | ICD-10-CM | POA: Diagnosis present

## 2021-02-23 DIAGNOSIS — R9430 Abnormal result of cardiovascular function study, unspecified: Secondary | ICD-10-CM | POA: Diagnosis not present

## 2021-02-23 DIAGNOSIS — I428 Other cardiomyopathies: Secondary | ICD-10-CM | POA: Insufficient documentation

## 2021-02-23 DIAGNOSIS — R059 Cough, unspecified: Secondary | ICD-10-CM | POA: Diagnosis not present

## 2021-02-23 DIAGNOSIS — Z87891 Personal history of nicotine dependence: Secondary | ICD-10-CM | POA: Insufficient documentation

## 2021-02-23 DIAGNOSIS — D860 Sarcoidosis of lung: Secondary | ICD-10-CM | POA: Insufficient documentation

## 2021-02-23 DIAGNOSIS — I4510 Unspecified right bundle-branch block: Secondary | ICD-10-CM | POA: Insufficient documentation

## 2021-02-23 LAB — BASIC METABOLIC PANEL
Anion gap: 9 (ref 5–15)
BUN: 14 mg/dL (ref 6–20)
CO2: 25 mmol/L (ref 22–32)
Calcium: 9.3 mg/dL (ref 8.9–10.3)
Chloride: 104 mmol/L (ref 98–111)
Creatinine, Ser: 0.86 mg/dL (ref 0.61–1.24)
GFR, Estimated: >60 mL/min (ref >60)
Glucose, Bld: 97 mg/dL (ref 70–99)
Potassium: 4.2 mmol/L (ref 3.5–5.1)
Sodium: 138 mmol/L (ref 135–145)

## 2021-02-23 LAB — BRAIN NATRIURETIC PEPTIDE: B Natriuretic Peptide: 80.3 pg/mL (ref 0.0–100.0)

## 2021-02-23 MED ORDER — EMPAGLIFLOZIN 10 MG PO TABS: 10.0000 mg | ORAL_TABLET | Freq: Every day | ORAL | 3 refills | Status: DC

## 2021-02-23 MED ORDER — METOPROLOL SUCCINATE ER 25 MG PO TB24: 12.5000 mg | ORAL_TABLET | Freq: Every day | ORAL | 3 refills | Status: DC

## 2021-02-23 NOTE — Progress Notes (Signed)
ReDS Vest / Clip - 02/23/21 1200       ReDS Vest / Clip   Station Marker C    Ruler Value 31    ReDS Value Range Moderate volume overload    ReDS Actual Value 36

## 2021-02-23 NOTE — Patient Instructions (Addendum)
Labs done today. We will contact you only if your labs are abnormal.  START Jardiance 10mg  (1 tablet) by mouth daily.   START taking Toprol daily at bedtime.  No other medication changes were made. Please continue all current medications as prescribed.  Your provider requested that you have a Cardiac PET Scan. We will contact you at a later date.   Your physician recommends that you schedule a follow-up appointment in: 2 months  If you have any questions or concerns before your next appointment please send a message through [place] or call our office at [phone].    TO LEAVE A MESSAGE FOR THE NURSE SELECT OPTION 2, PLEASE LEAVE A MESSAGE INCLUDING: YOUR NAME DATE OF BIRTH CALL BACK NUMBER REASON FOR CALL**this is important as we prioritize the call backs  YOU WILL RECEIVE A CALL BACK THE SAME DAY AS LONG AS YOU CALL BEFORE 4:00 PM   Do the following things EVERYDAY: Weigh yourself in the morning before breakfast. Write it down and keep it in a log. Take your medicines as prescribed Eat low salt foods--Limit salt (sodium) to 2000 mg per day.  Stay as active as you can everyday Limit all fluids for the day to less than 2 liters   At the [facility], you and your health needs are our priority. As part of our continuing mission to provide you with exceptional heart care, we have created designated Provider Care Teams. These Care Teams include your primary Cardiologist (physician) and Advanced Practice Providers (APPs- Physician Assistants and Nurse Practitioners) who all work together to provide you with the care you need, when you need it.   You may see any of the following providers on your designated Care Team at your next follow up: Dr [phone] Dr Arvilla Meres, NP Carron Curie, Robbie Lis [place], PharmD   Please be sure to bring in all your medications bottles to every appointment.

## 2021-02-23 NOTE — Progress Notes (Signed)
PCP: Malva Limes, MD Cardiology: Dr. Mariah Milling HF Cardiology: Dr. Shirlee Latch  50 y.o. with history of pulmonary sarcoidosis, asthma/COPD, and nonischemic cardiomyopathy presents for evaluation of CHF.  Patient was diagnosed with sarcoidosis after bronchoscopy and presumably a biopsy in [place] in 4/12. He says that he took steroids and MTX x 1 year then was weaned off.  No immunosuppression since that time.  Echo in 3/20 was normal.  He had an echo in 2/22, after his 2nd bout with COVID-19, with EF down to 45-50%.  Given concern for cardiac involvement by sarcoidosis, he had a cardiac MRI in 4/22.  Despite moderately decreased LV and RV systolic function, no delayed enhancement was seen.  He subsequently had a coronary CTA in 6/22 that showed no significant coronary disease.  Patient also has a history of heavy smoking but quit in 2005.  His pulmonologist has said that he has asthma/COPD.    Patient reports exertional dyspnea since his sarcoidosis diagnosis in 2012.  It has not changed much.  He is short of breath mildly walking up stairs but is able to do his job without difficulty.  He is short of breath with sex.  Occasional cough.  He is significantly fatigued by the afternoon. Despite these symptoms, he was able to climb to the top of [employer] a couple of weeks ago.  He was started on cardiac meds with cardiomyopathy, but developed significant orthostatic symptoms and these have been cut back.  Most recently, Entresto was decreased to 24/26 1/2 tab bid. He is doing better now with less lightheadedness. BP generally in the 100s/60s.    REDS clip 36%  ECG (personally reviewed): NSR, RBBB  PMH: 1. Pulmonary sarcoidosis: Diagnosed in 4/12 in [place], he was on steroids + MTX for about a year.   - CT chest in 6/22 showed chronic interstitial changes consistent with sarcoidosis.  2. RBBB 3. GERD 4. Nonischemic cardiomyopathy:  - Echo (3/20) with EF 55-60%.  - Echo (2/22) with EF 45-50% -  Cardiac MRI (4/22):  LV EF 36% with mild-moderate LV dilation, RV EF 33%, no LGE.  - Coronary CTA (6/22): Calcium score 0, no significant coronary disease noted.  5. Asthma/COPD: Quit smoking around 2005.  6. COVID-19 in 10/20 and 1/22.   SH: No ETOH, drugs.  Quit smoking in 2005.  Works in [employer].  Lives in [place].   FH: Unsure of cause of father's death.    ROS: All systems reviewed and negative except as per HPI.   Current Outpatient Medications  Medication Sig Dispense Refill   empagliflozin (JARDIANCE) 10 MG TABS tablet Take 1 tablet (10 mg total) by mouth daily before breakfast. 90 tablet 3   Fluticasone-Umeclidin-Vilant (TRELEGY ELLIPTA) 100-62.5-25 MCG/INH AEPB Inhale 1 puff into the lungs daily.     ibuprofen (ADVIL) 200 MG tablet Take 200 mg by mouth every 6 (six) hours as needed.     sacubitril-valsartan (ENTRESTO) 24-26 MG Take 0.5 tablets by mouth 2 (two) times daily.     metoprolol succinate (TOPROL XL) 25 MG 24 hr tablet Take 0.5 tablets (12.5 mg total) by mouth at bedtime. 45 tablet 3   No current facility-administered medications for this encounter.   BP 106/78   Pulse 72   Wt 74.1 kg (163 lb 6.4 oz)   SpO2 97%   BMI 26.37 kg/m  General: NAD Neck: No JVD, no thyromegaly or thyroid nodule.  Lungs: Clear to auscultation bilaterally with normal respiratory effort. CV: Nondisplaced PMI.  Heart regular S1/S2, no S3/S4, no murmur.  No peripheral edema.  No carotid bruit.  Normal pedal pulses.  Abdomen: Soft, nontender, no hepatosplenomegaly, no distention.  Skin: Intact without lesions or rashes.  Neurologic: Alert and oriented x 3.  Psych: Normal affect. Extremities: No clubbing or cyanosis.  HEENT: Normal.   Assessment/Plan: 1. Chronic systolic CHF: Nonischemic cardiomyopathy.  Last echo in 2/22 with EF 45-50%, cardiac MRI in 4/22 with LV EF 36% with mild-moderate LV dilation, RV EF 33%, no LGE.  Coronary CTA in 6/22 showed no significant CAD.  Cause of  cardiomyopathy uncertain.  Given pulmonary sarcoidosis, cardiac sarcoidosis is certainly a concern.  No delayed enhancement, however, was seen on cMRI.  Viral myocarditis from COVID-19 is also a concern, but again, no LGE was seen on cMRI.  He does not drink or use drugs, no known FH of cardiomyopathy.  He is not volume overloaded on exam, I expect the mildly elevated REDS clip may be a false positive due to lung disease. NYHA class II symptoms.  Cardiac meds limited by low BP.  - I will arrange for cardiac PET at [facility] to look for any evidence of active inflammation that could be related to cardiac sarcoidosis.  This will also give an EF measurement.  - Continue Toprol XL 12.5 mg qhs.  - Continue Entresto 1/2 tab 24/26 bid.  - I will add Farxiga 10 mg daily with BMET today and in 10 days.  This hopefully will not have much effect on his BP.  - Would try to add low dose spironolactone next in the evening.  - Avoid ibuprofen, use Tylenol for pain.  2. Pulmonary sarcoidosis: Not currently on immunosuppressives, followed by pulmonary.  3. Asthma/COPD: No longer smoking.  Followed by pulmonary.   Steve Wang 02/23/2021

## 2021-02-24 ENCOUNTER — Other Ambulatory Visit (HOSPITAL_COMMUNITY): Payer: Self-pay

## 2021-02-24 ENCOUNTER — Telehealth (HOSPITAL_COMMUNITY): Payer: Self-pay | Admitting: Pharmacy Technician

## 2021-02-24 NOTE — Telephone Encounter (Signed)
Advanced Heart Failure Patient Advocate Encounter  Patient Advocate Encounter   Received notification from [employer] that prior authorization for Jardiance is required.   PA submitted on CoverMyMeds Key [id number] Status is pending   Will continue to follow.

## 2021-02-24 NOTE — Telephone Encounter (Signed)
Advanced Heart Failure Patient Advocate Encounter  Prior Authorization for [place] has been approved.    PA# [id number] Effective dates: 02/24/21 through 02/24/2022  Patients co-pay is $44.99  Emailed patient co-pay card information, as requested.   Archer Asa, CPhT

## 2021-02-28 ENCOUNTER — Encounter (HOSPITAL_COMMUNITY): Payer: Self-pay

## 2021-03-02 ENCOUNTER — Encounter (HOSPITAL_COMMUNITY): Payer: Self-pay

## 2021-03-17 ENCOUNTER — Encounter (HOSPITAL_COMMUNITY): Payer: Self-pay

## 2021-03-23 ENCOUNTER — Telehealth (HOSPITAL_COMMUNITY): Payer: Self-pay | Admitting: *Deleted

## 2021-03-23 NOTE — Telephone Encounter (Addendum)
Pet scan auth in clinical review

## 2021-03-24 ENCOUNTER — Other Ambulatory Visit: Payer: No Typology Code available for payment source

## 2021-03-28 ENCOUNTER — Encounter (HOSPITAL_COMMUNITY): Payer: Self-pay

## 2021-03-31 ENCOUNTER — Other Ambulatory Visit: Payer: Self-pay

## 2021-03-31 DIAGNOSIS — I255 Ischemic cardiomyopathy: Secondary | ICD-10-CM

## 2021-03-31 DIAGNOSIS — I5020 Unspecified systolic (congestive) heart failure: Secondary | ICD-10-CM

## 2021-04-04 ENCOUNTER — Ambulatory Visit (INDEPENDENT_AMBULATORY_CARE_PROVIDER_SITE_OTHER): Payer: No Typology Code available for payment source

## 2021-04-04 ENCOUNTER — Other Ambulatory Visit: Payer: Self-pay

## 2021-04-04 DIAGNOSIS — I5020 Unspecified systolic (congestive) heart failure: Secondary | ICD-10-CM

## 2021-04-04 DIAGNOSIS — I255 Ischemic cardiomyopathy: Secondary | ICD-10-CM | POA: Diagnosis not present

## 2021-04-04 NOTE — Progress Notes (Signed)
Cardiology Office Note  Date:  04/05/2021   ID:  Steve Wang, DOB 08-19-1970, MRN [id number]  PCP:  Malva Limes, MD   Chief Complaint  Patient presents with   2 month follow up     Discuss Echo results. Patient c/o shortness of breath & had a spell of chest pain two days ago. Medications reviewed by the patient verbally.     HPI:  [name] is a 50 year old gentleman with past medical history of Pulmonary sarcoidosis, treated in [place] Told he has COPD PNA: 05/2018 04/2019 and 07/2020 with COVID Who presents for follow-up of his cardiomyopathy, ejection fraction 45% on echo, down to 36% on MRI with dilated LV  Last in clinic by myself April 2022 Works with [employer]  Prior lab work:  ACE level is normal suggesting low to no activity of sarcoid.  coronary CTA in 6/22 that showed no significant coronary disease  Seen by advanced heart failure clinic February 23, 2021 Cardiac PET scan ordered  Echocardiogram performed yesterday ejection fraction 45 to 50% Unchanged from echocardiogram in early 2022  Cardiac MRI  Mild to moderately dilated Left ventricular size (LVIDD 6.3cm), normal LV thickness. Moderately reduced LV systolic function (LVEF = 36%). There is global hypokinesis with no regional wall motion abnormalities -No evidence of sarcoid  EKG personally reviewed by myself on todays visit NSR rate 60 bpm RBBB  Of past medical history reviewed  remote history of sarcoidosis with pulmonary involvement on steroids and methotrexate remotely at [facility] [place]  10/2010   Echo 08/2020   1. Left ventricular ejection fraction, by estimation, is 45 to 50%. The  left ventricle has mildly decreased function. The left ventricle  demonstrates global hypokinesis. The left ventricular internal cavity size  was severely dilated. Left ventricular  diastolic parameters are consistent with Grade II diastolic dysfunction  (pseudonormalization).   2. Right ventricular  systolic function is low normal. The right  ventricular size is normal.   3. The mitral valve is normal in structure. Trivial mitral valve  regurgitation. No evidence of mitral stenosis.   4. The aortic valve is tricuspid. Aortic valve regurgitation is not  visualized. No aortic stenosis is present.   echo 09/2018,  Normal EF 55-60%  CT chest 1. Perihilar predominant traction bronchiectasis, parenchymal retraction and architectural distortion, findings in keeping with the given history of sarcoid. 2. Left ventricular dilatation. 3. 3 mm left lower lobe nodule. No follow-up needed if patient is low-risk. Non-contrast chest CT can be considered in 12 months if patient is high-risk. This recommendation follows the consensus statement: Guidelines for Management of Incidental Pulmonary Nodules Detected on CT Images: From the Fleischner Society 2017; Radiology 2017; 284:228-243. 4.  Emphysema (ICD10-J43.9).    PMH:   has a past medical history of GERD (gastroesophageal reflux disease), HFrEF (heart failure with reduced ejection fraction) (HCC), NICM (nonischemic cardiomyopathy) (HCC), Pulmonary nodule, RBBB, and Sarcoidosis.  PSH:    Past Surgical History:  Procedure Laterality Date   BRONCHOSCOPY  11/09/2010   [facility] [place]    Current Outpatient Medications  Medication Sig Dispense Refill   empagliflozin (JARDIANCE) 10 MG TABS tablet Take 1 tablet (10 mg total) by mouth daily before breakfast. 90 tablet 3   Fluticasone-Umeclidin-Vilant (TRELEGY ELLIPTA) 100-62.5-25 MCG/INH AEPB Inhale 1 puff into the lungs daily.     ibuprofen (ADVIL) 200 MG tablet Take 200 mg by mouth every 6 (six) hours as needed.     metoprolol succinate (TOPROL XL)  25 MG 24 hr tablet Take 0.5 tablets (12.5 mg total) by mouth at bedtime. 45 tablet 3   sacubitril-valsartan (ENTRESTO) 24-26 MG Take 0.5 tablets by mouth 2 (two) times daily.     No current facility-administered medications for this visit.     Allergies:   Patient has no known allergies.   Social History:  The patient  reports that he quit smoking about 12 years ago. His smoking use included cigarettes. He has a 25.00 pack-year smoking history. He quit smokeless tobacco use about 8 months ago.  His smokeless tobacco use included chew. He reports that he does not currently use drugs. He reports that he does not drink alcohol.   Family History:   family history includes COPD in his mother.    Review of Systems: Review of Systems  Constitutional: Negative.   HENT: Negative.    Respiratory: Negative.    Cardiovascular:  Positive for chest pain.  Gastrointestinal: Negative.   Musculoskeletal: Negative.   Neurological: Negative.   Psychiatric/Behavioral: Negative.    All other systems reviewed and are negative.  PHYSICAL EXAM: VS:  BP 90/60 (BP Location: Left Arm, Patient Position: Sitting, Cuff Size: Normal)   Pulse 60   Ht 5\' 7"  (1.702 m)   Wt 164 lb 4 oz (74.5 kg)   SpO2 98%   BMI 25.73 kg/m  , BMI Body mass index is 25.73 kg/m. GEN: Well nourished, well developed, in no acute distress HEENT: normal Neck: no JVD, carotid bruits, or masses Cardiac: RRR; no murmurs, rubs, or gallops,no edema  Respiratory:  clear to auscultation bilaterally, normal work of breathing GI: soft, nontender, nondistended, + BS MS: no deformity or atrophy Skin: warm and dry, no rash Neuro:  Strength and sensation are intact Psych: euthymic mood, full affect  Recent Labs: 08/11/2020: Hemoglobin 14.0; Platelets 269 02/23/2021: B Natriuretic Peptide 80.3; BUN 14; Creatinine, Ser 0.86; Potassium 4.2; Sodium 138    Lipid Panel Lab Results  Component Value Date   CHOL 155 06/25/2018   HDL 57 06/25/2018   LDLCALC 84 06/25/2018   TRIG 70 06/25/2018      Wt Readings from Last 3 Encounters:  04/05/21 164 lb 4 oz (74.5 kg)  02/23/21 163 lb 6.4 oz (74.1 kg)  02/14/21 161 lb (73 kg)     ASSESSMENT AND PLAN:  Problem List Items  Addressed This Visit     Sarcoidosis of lung (HCC)   Relevant Orders   EKG 12-Lead   Other Visit Diagnoses     HFrEF (heart failure with reduced ejection fraction) (HCC)    -  Primary   NICM (nonischemic cardiomyopathy) (HCC)       Relevant Orders   EKG 12-Lead   Orthostatic lightheadedness       Dilated cardiomyopathy (HCC)         Dilated cardiomyopathy COVID x2, January 2022 last episode, does not want vaccine Echo 45 to 50% ejection fraction February 2022, repeat yesterday unchanged EF low on MRI 36% Tolerating very low-dose Entresto, metoprolol, Tolerating Jardiance Does not appear to have room on blood pressure for spironolactone, systolic pressure 88-90, still having some lightheadedness/orthostasis -Has not received clearance from insurance for PET cardiac study May need to push back visit with [facility] CHF clinic    Total encounter time more than 25 minutes  Greater than 50% was spent in counseling and coordination of care with the patient   Signed, [facility], M.D., Ph.D. [facility] [place], [place]

## 2021-04-05 ENCOUNTER — Encounter: Payer: Self-pay | Admitting: Cardiovascular Disease

## 2021-04-05 ENCOUNTER — Ambulatory Visit (INDEPENDENT_AMBULATORY_CARE_PROVIDER_SITE_OTHER): Payer: No Typology Code available for payment source | Admitting: Cardiovascular Disease

## 2021-04-05 VITALS — BP 90/60 | HR 60 | Ht 67.0 in | Wt 164.2 lb

## 2021-04-05 DIAGNOSIS — I5020 Unspecified systolic (congestive) heart failure: Secondary | ICD-10-CM

## 2021-04-05 DIAGNOSIS — R42 Dizziness and giddiness: Secondary | ICD-10-CM | POA: Diagnosis not present

## 2021-04-05 DIAGNOSIS — D860 Sarcoidosis of lung: Secondary | ICD-10-CM

## 2021-04-05 DIAGNOSIS — I428 Other cardiomyopathies: Secondary | ICD-10-CM

## 2021-04-05 DIAGNOSIS — I420 Dilated cardiomyopathy: Secondary | ICD-10-CM

## 2021-04-05 LAB — ECHOCARDIOGRAM COMPLETE
AR max vel: 3.26 cm2
AV Area VTI: 3.37 cm2
AV Area mean vel: 3.37 cm2
AV Mean grad: 2 mmHg
AV Peak grad: 3.7 mmHg
Ao pk vel: 0.96 m/s
Area-P 1/2: 2.38 cm2
Calc EF: 46.3 %
S' Lateral: 4.9 cm
Single Plane A2C EF: 43.5 %
Single Plane A4C EF: 50.4 %

## 2021-04-05 NOTE — Patient Instructions (Addendum)
Medication Instructions:  No changes  If you need a refill on your cardiac medications before your next appointment, please call your pharmacy.   Lab work: No new labs needed  Testing/Procedures: No new testing needed  Follow-Up: At [facility], you and your health needs are our priority.  As part of our continuing mission to provide you with exceptional heart care, we have created designated Provider Care Teams.  These Care Teams include your primary Cardiologist (physician) and Advanced Practice Providers (APPs -  Physician Assistants and Nurse Practitioners) who all work together to provide you with the care you need, when you need it.  You will need a follow up appointment in 6 months Dr. Shirlee Latch 10/5 [facility]) 3:20 pm   COVID-19 Vaccine Information can be found at: [email or web address] For questions related to vaccine distribution or appointments, please email [email or web address] or call [phone].

## 2021-04-21 NOTE — Telephone Encounter (Signed)
Case for PET scan denied.

## 2021-04-24 ENCOUNTER — Telehealth (HOSPITAL_COMMUNITY): Payer: Self-pay | Admitting: *Deleted

## 2021-04-24 ENCOUNTER — Telehealth (HOSPITAL_COMMUNITY): Payer: Self-pay | Admitting: Vascular Surgery

## 2021-04-24 ENCOUNTER — Encounter (HOSPITAL_COMMUNITY): Payer: Self-pay | Admitting: *Deleted

## 2021-04-24 NOTE — Telephone Encounter (Signed)
Pt called to f/u on PET SCAN. PT states he has been calling and no one contacted him back about the scan , he is very upset and frustrated because it his life on the line and no one seems to care. He said it has been over 2 month and no one has scheduled the PET scan, and no one has called him back at our office .. please call pt ASAP

## 2021-04-24 NOTE — Telephone Encounter (Signed)
I personally messaged Pt regarding PET scan via mychart last month. Unfortunately Berkley Harvey was denied on 9/30 and will have to be appealed. I called pt explained appeals process. I will follow up with patient as I receive information from his insurance. Pt thanked me for the call.

## 2021-04-24 NOTE — Telephone Encounter (Signed)
New case for PET scan submitted to [facility] in clinical review.

## 2021-04-25 ENCOUNTER — Telehealth (HOSPITAL_COMMUNITY): Payer: Self-pay | Admitting: Cardiology

## 2021-04-25 NOTE — Telephone Encounter (Signed)
Pt appt w/DM r/s until 06/01/21, it was pushed back, waiting on approval for pet scan, please call pt w/update. Thanks

## 2021-04-26 ENCOUNTER — Encounter (HOSPITAL_COMMUNITY): Payer: No Typology Code available for payment source | Admitting: Cardiology

## 2021-05-12 ENCOUNTER — Telehealth (HOSPITAL_COMMUNITY): Payer: Self-pay | Admitting: Surgery

## 2021-05-12 NOTE — Telephone Encounter (Signed)
Steve. Steve Wang called regarding his prior authorization for his PET scan.  He tells me that he contacted his insurance company and they tell him that the test was pre-approved on 10/18.  Per Steve Wang CMA patient does have a pre-approval.  All items to be faxed to [facility] to proceed with scheduling process.  Steve Wang made aware that he should hear from [facility] to schedule the study.

## 2021-05-23 ENCOUNTER — Encounter (HOSPITAL_COMMUNITY): Payer: Self-pay

## 2021-05-31 ENCOUNTER — Encounter (HOSPITAL_COMMUNITY): Payer: Self-pay

## 2021-06-01 ENCOUNTER — Encounter (HOSPITAL_COMMUNITY): Payer: No Typology Code available for payment source | Admitting: Cardiology

## 2021-07-20 ENCOUNTER — Encounter (HOSPITAL_COMMUNITY): Payer: Self-pay | Admitting: Cardiology

## 2021-07-20 ENCOUNTER — Ambulatory Visit (HOSPITAL_COMMUNITY)
Admission: RE | Admit: 2021-07-20 | Discharge: 2021-07-20 | Disposition: A | Discharge status: Home / Self Care | Payer: No Typology Code available for payment source | Source: Ambulatory Visit | Attending: Cardiology | Admitting: Cardiology

## 2021-07-20 ENCOUNTER — Encounter: Payer: Self-pay | Admitting: Pulmonary Disease

## 2021-07-20 ENCOUNTER — Other Ambulatory Visit: Payer: Self-pay

## 2021-07-20 VITALS — BP 94/68 | HR 77 | Wt 171.2 lb

## 2021-07-20 DIAGNOSIS — Z8616 Personal history of COVID-19: Secondary | ICD-10-CM | POA: Insufficient documentation

## 2021-07-20 DIAGNOSIS — Z79899 Other long term (current) drug therapy: Secondary | ICD-10-CM | POA: Insufficient documentation

## 2021-07-20 DIAGNOSIS — Z87891 Personal history of nicotine dependence: Secondary | ICD-10-CM | POA: Insufficient documentation

## 2021-07-20 DIAGNOSIS — I428 Other cardiomyopathies: Secondary | ICD-10-CM | POA: Diagnosis not present

## 2021-07-20 DIAGNOSIS — I5022 Chronic systolic (congestive) heart failure: Secondary | ICD-10-CM | POA: Diagnosis present

## 2021-07-20 DIAGNOSIS — J449 Chronic obstructive pulmonary disease, unspecified: Secondary | ICD-10-CM | POA: Diagnosis not present

## 2021-07-20 DIAGNOSIS — D860 Sarcoidosis of lung: Secondary | ICD-10-CM

## 2021-07-20 DIAGNOSIS — D8685 Sarcoid myocarditis: Secondary | ICD-10-CM | POA: Diagnosis not present

## 2021-07-20 LAB — BASIC METABOLIC PANEL
Anion gap: 6 (ref 5–15)
BUN: 13 mg/dL (ref 6–20)
CO2: 26 mmol/L (ref 22–32)
Calcium: 9.3 mg/dL (ref 8.9–10.3)
Chloride: 104 mmol/L (ref 98–111)
Creatinine, Ser: 0.91 mg/dL (ref 0.61–1.24)
GFR, Estimated: >60 mL/min (ref >60)
Glucose, Bld: 98 mg/dL (ref 70–99)
Potassium: 4.3 mmol/L (ref 3.5–5.1)
Sodium: 136 mmol/L (ref 135–145)

## 2021-07-20 LAB — BRAIN NATRIURETIC PEPTIDE: B Natriuretic Peptide: 21.8 pg/mL (ref 0.0–100.0)

## 2021-07-20 MED ORDER — SULFAMETHOXAZOLE-TRIMETHOPRIM 800-160 MG PO TABS: 1.0000 | ORAL_TABLET | ORAL | 4 refills | Status: DC

## 2021-07-20 MED ORDER — FOLIC ACID 1 MG PO TABS: 1.0000 mg | ORAL_TABLET | Freq: Every day | ORAL | 6 refills | Status: DC

## 2021-07-20 MED ORDER — OMEPRAZOLE 20 MG PO CPDR: 20.0000 mg | DELAYED_RELEASE_CAPSULE | Freq: Every day | ORAL | 6 refills | Status: DC

## 2021-07-20 MED ORDER — PREDNISONE 10 MG PO TABS: 30.0000 mg | ORAL_TABLET | Freq: Every day | ORAL | 0 refills | Status: DC

## 2021-07-20 MED ORDER — CALCIUM CITRATE 333 MG PO TABS: 4.0000 | ORAL_TABLET | Freq: Every day | ORAL | Status: DC

## 2021-07-20 MED ORDER — METHOTREXATE 2.5 MG PO TABS: ORAL_TABLET | ORAL | 0 refills | Status: DC

## 2021-07-20 MED ORDER — VITAMIN D (CHOLECALCIFEROL) 25 MCG (1000 UT) PO CAPS: 1.0000 | ORAL_CAPSULE | Freq: Every day | ORAL | Status: DC

## 2021-07-20 NOTE — Patient Instructions (Signed)
Start:   Prednisone 30 mg (3 tabs) Daily Methotrexate 10 mg (4 tabs) on Friday 12/30 and Friday 07/28/21  Methotrexate 12.5 mg (5 tabs) on Friday 1/13 and Friday 1/20 Bactrim DS 1 tab every Monday, Wednesday, and Friday Folic Acid 1 mg Daily  Calcium Citrate Daily, 1200-1500 mg Daily, over the counter Vitamin D, [phone] IU (25 mcg) Daily, over the counter Omeprazole 20 mg Daily  The Prednisone and Methotrexate will be titrated at each appointment you have with our office  Labs done today, your results will be available in MyChart, we will contact you for abnormal readings.  You have been referred to [facility], they will call you for an appointment  Please follow up with our heart failure pharmacist in 4 weeks  Your physician recommends that you schedule a follow-up appointment in: 8 weeks with Dr Shirlee Latch  If you have any questions or concerns before your next appointment please send [place] a message through [facility] or call our office at [phone].    TO LEAVE A MESSAGE FOR THE NURSE SELECT OPTION 2, PLEASE LEAVE A MESSAGE INCLUDING: YOUR NAME DATE OF BIRTH CALL BACK NUMBER REASON FOR CALL**this is important as we prioritize the call backs  YOU WILL RECEIVE A CALL BACK THE SAME DAY AS LONG AS YOU CALL BEFORE 4:00 PM  At the [facility], you and your health needs are our priority. As part of our continuing mission to provide you with exceptional heart care, we have created designated Provider Care Teams. These Care Teams include your primary Cardiologist (physician) and Advanced Practice Providers (APPs- Physician Assistants and Nurse Practitioners) who all work together to provide you with the care you need, when you need it.   You may see any of the following providers on your designated Care Team at your next follow up: Dr Arvilla Meres Dr Carron Curie, NP Robbie Lis, [place] [facility] Reid Hope King, [place] Karle Plumber, PharmD   Please  be sure to bring in all your medications bottles to every appointment.

## 2021-07-20 NOTE — Progress Notes (Signed)
Discussed Sarcoid Protocol with Steve Wang, Pharm D, prescriptions sent in accordingly and pt aware to p/u Calcium, Vit D, and Omeprazole over the counter. F/u appt sch with Lauren in pharmacy clinic on 08/17/21 for further titration

## 2021-07-20 NOTE — Telephone Encounter (Signed)
°  Dr Jayme Cloud please advise:   Good afternoon I just finished a appointment with doctor mcclain and went over my pet scan he said he saw what appeared to be Sariod still in my lungs I was hoping you could get in touch with him a possibly get raw images from [facility] because after we spoke last you had thought it had gone into remission,  look forward to hearing from you    Thank you  [name]

## 2021-07-21 NOTE — Progress Notes (Signed)
PCP: Malva Limes, MD Cardiology: Dr. Mariah Milling HF Cardiology: Dr. Shirlee Latch  50 y.o. with history of pulmonary sarcoidosis, asthma/COPD, and nonischemic cardiomyopathy presents for evaluation of CHF.  Patient was diagnosed with sarcoidosis after bronchoscopy and presumably a biopsy in [place] in 4/12. He says that he took steroids and MTX x 1 year then was weaned off.  No immunosuppression since that time.  Echo in 3/20 was normal.  He had an echo in 2/22, after his 2nd bout with COVID-19, with EF down to 45-50%.  Given concern for cardiac involvement by sarcoidosis, he had a cardiac MRI in 4/22.  Despite moderately decreased LV and RV systolic function, no delayed enhancement was seen.  He subsequently had a coronary CTA in 6/22 that showed no significant coronary disease.  Patient also has a history of heavy smoking but quit in 2005.  His pulmonologist has said that he has asthma/COPD.    Repeat echo in 9/22 showed EF 45-50%, dilated LV, normal RV, mild MR.   Cardiac PET in 12/22 at [facility] (took a long time to get due to insurance issues) showed abnormal metabolism in the LV lateral wall consistent with mild-moderate inflammation from possible cardiac sarcoidosis, EF 53%; there were extracardiac hypermetabolic areas in the chest suggestive of sarcoidosis; focal FDG uptake distal esophagus, ?esophagitis versus malignancy.   Patient reports exertional dyspnea since his sarcoidosis diagnosis in 2012.  It has not changed much.  He is short of breath walking up inclines or carrying heavy loads.  No orthopnea/PND.  No chest pain. No palpitations or lightheadedness.    Labs (8/22): K 4.2, creatinine 0.86, BNP 80  PMH: 1. Pulmonary sarcoidosis: Diagnosed in 4/12 in [place], he was on steroids + MTX for about a year.   - CT chest in 6/22 showed chronic interstitial changes consistent with sarcoidosis.  2. RBBB 3. GERD 4. Nonischemic cardiomyopathy:  - Echo (3/20) with EF 55-60%.  - Echo (2/22) with EF  45-50% - Cardiac MRI (4/22):  LV EF 36% with mild-moderate LV dilation, RV EF 33%, no LGE.  - Coronary CTA (6/22): Calcium score 0, no significant coronary disease noted.  - Echo (9/22): EF 45-50%, dilated LV, normal RV, mild MR.  - Cardiac PET (12/22): Abnormal metabolism in the LV lateral wall consistent with mild-moderate inflammation from possible cardiac sarcoidosis, EF 53%; there were extracardiac hypermetabolic areas in the chest suggestive of sarcoidosis; focal FDG uptake distal esophagus, ?esophagitis versus malignancy.  5. Asthma/COPD: Quit smoking around 2005.  6. COVID-19 in 10/20 and 1/22.  7. GERD  SH: No ETOH, drugs.  Quit smoking in 2005.  Works in [employer].  Lives in [place].   FH: Unsure of cause of father's death.    ROS: All systems reviewed and negative except as per HPI.   Current Outpatient Medications  Medication Sig Dispense Refill   Calcium Citrate 333 MG TABS Take 4 tablets by mouth daily at 12 noon.     empagliflozin (JARDIANCE) 10 MG TABS tablet Take 1 tablet (10 mg total) by mouth daily before breakfast. 90 tablet 3   Fluticasone-Umeclidin-Vilant (TRELEGY ELLIPTA) 100-62.5-25 MCG/INH AEPB Inhale 1 puff into the lungs daily. As needed     folic acid (FOLVITE) 1 MG tablet Take 1 tablet (1 mg total) by mouth daily. 30 tablet 6   ibuprofen (ADVIL) 200 MG tablet Take 200 mg by mouth every 6 (six) hours as needed.     methotrexate (RHEUMATREX) 2.5 MG tablet Take 4 tabs weekly every Friday for  2 weeks, then take 5 tabs every Friday for 2 weeks. Caution:Chemotherapy. Protect from light. 18 tablet 0   metoprolol succinate (TOPROL XL) 25 MG 24 hr tablet Take 0.5 tablets (12.5 mg total) by mouth at bedtime. 45 tablet 3   omeprazole (PRILOSEC) 20 MG capsule Take 1 capsule (20 mg total) by mouth daily. 30 capsule 6   predniSONE (DELTASONE) 10 MG tablet Take 3 tablets (30 mg total) by mouth daily with breakfast. 30 tablet 0   sacubitril-valsartan (ENTRESTO) 24-26 MG Take 0.5  tablets by mouth 2 (two) times daily.     sulfamethoxazole-trimethoprim (BACTRIM DS) 800-160 MG tablet Take 1 tablet by mouth 3 (three) times a week. Every Mon, Wed, Fri 15 tablet 4   Vitamin D, Cholecalciferol, 25 MCG (1000 UT) CAPS Take 1 capsule by mouth daily. 60 capsule    No current facility-administered medications for this encounter.   BP 94/68    Pulse 77    Wt 77.7 kg (171 lb 3.2 oz)    SpO2 96%    BMI 26.81 kg/m  General: NAD Neck: No JVD, no thyromegaly or thyroid nodule.  Lungs: Clear to auscultation bilaterally with normal respiratory effort. CV: Nondisplaced PMI.  Heart regular S1/S2, no S3/S4, no murmur.  No peripheral edema.  No carotid bruit.  Normal pedal pulses.  Abdomen: Soft, nontender, no hepatosplenomegaly, no distention.  Skin: Intact without lesions or rashes.  Neurologic: Alert and oriented x 3.  Psych: Normal affect. Extremities: No clubbing or cyanosis.  HEENT: Normal.   Assessment/Plan: 1. Chronic systolic CHF: Nonischemic cardiomyopathy.  Last echo in 2/22 with EF 45-50%, cardiac MRI in 4/22 with LV EF 36% with mild-moderate LV dilation, RV EF 33%, no LGE.  Coronary CTA in 6/22 showed no significant CAD.  Given pulmonary sarcoidosis, cardiac sarcoidosis was a concern.  No delayed enhancement, however, was seen on cMRI.  Viral myocarditis from COVID-19 was also a concern, but again, no LGE was seen on cMRI.  Echo in 9/22 showed EF up to 45-50%.  Cardiac PET in 12/22 showed abnormal metabolism in the LV lateral wall consistent with mild-moderate inflammation from possible cardiac sarcoidosis, EF 53%.  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 He is not volume overloaded on exam.  NYHA class II symptoms.  Cardiac meds limited by low BP.  - I will begin treatment with prednisone and methotrexate according to the [facility] protocol.  He will get Bactrim for PJP prophylaxis and calcium/vitamin  D/omeprazole.  Start prednisone at 30 mg and titrate down according to protocol as we titrate up MTX.  - Repeat cardiac PET after we get to goal MTX.   - Follow LFTs with MTX.  - Continue Toprol XL 12.5 mg qhs.  - Continue Entresto 1/2 tab 24/26 bid. BMET/BNP today.  - Continue Farxiga 10 mg daily.   2. Pulmonary sarcoidosis: Followed by pulmonary.  PET in 12/22 showed hypermetabolic areas in chest consistent with pulmonary sarcoidosis.  Will review treatment plan with his pulmonologist.  3. Asthma/COPD: No longer smoking.  Followed by pulmonary.  4. Distal esophageal FDG uptake: This could be due to GERD/esophagitis but cannot rule out malignancy.  - I will refer to GI for evaluation.  - Starting omeprazole.   Followup 1 month with HF pharmacist to manage prednisone/MTX taper.  Followup with me in 2 months.   Marca Ancona 07/21/2021

## 2021-07-25 ENCOUNTER — Encounter: Payer: Self-pay | Admitting: Gastroenterology

## 2021-07-25 ENCOUNTER — Ambulatory Visit
Admission: RE | Admit: 2021-07-25 | Discharge: 2021-07-25 | Disposition: A | Discharge status: Home / Self Care | Payer: Self-pay | Source: Ambulatory Visit | Attending: Pulmonary Disease | Admitting: Pulmonary Disease

## 2021-07-25 ENCOUNTER — Other Ambulatory Visit: Payer: Self-pay

## 2021-07-25 DIAGNOSIS — R911 Solitary pulmonary nodule: Secondary | ICD-10-CM

## 2021-07-25 NOTE — Telephone Encounter (Signed)
Sarcoidosis is on SYSTEMIC disease.  It does not affect 1 organ alone.  The reason why I sent him to cardiology was because I was concerned he had cardiac sarcoidosis given that his heart function was low.  Cardiac PET has shown that he does have evidence of cardiac sarcoidosis.  He is on appropriate therapy with prednisone and methotrexate.  This will also treat sarcoidosis in any of the other organs.  There may be some uptake in the lungs but that is reflecting the issue that he has in his heart.  Again prednisone and methotrexate would be the same therapy.  He is way past overdue for a follow-up with me.  I recommend he make an appointment so that we can go over all these things.  Several things I recommend at present, continue prednisone and methotrexate as directed by cardiology this will also treat sarcoidosis and other organs.  He will need a CT chest with contrast given that abnormalities were noted on his esophagus.  I also recommend a referral to GI for upper endoscopy.  Also send a request for films from [facility] for the cardiac PET images to be downloaded to our PACS system.  This will allow me to give him a more definitive evaluation of what was seen at [facility].

## 2021-07-28 ENCOUNTER — Encounter (HOSPITAL_COMMUNITY): Payer: Self-pay | Admitting: Cardiology

## 2021-07-28 ENCOUNTER — Telehealth (HOSPITAL_COMMUNITY): Payer: Self-pay

## 2021-07-28 ENCOUNTER — Encounter (HOSPITAL_COMMUNITY): Payer: Self-pay

## 2021-07-28 MED ORDER — PREDNISONE 10 MG PO TABS: 30.0000 mg | ORAL_TABLET | Freq: Every day | ORAL | 0 refills | Status: DC

## 2021-07-28 NOTE — Telephone Encounter (Signed)
Patient left message about his prednisone. Spoke with patient and informed him that a refill for the 60 tablets required has been sent to his pharmacy.

## 2021-07-31 NOTE — Patient Instructions (Signed)
It was a pleasure seeing you today!  MEDICATIONS: -We are changing your medications today -Increase methotrexate to 15 mg (6 tablets) every Friday for 2 weeks, then increase to methotrexate 17.5 mg (7 tablets) once weekly every Friday. You will see Dr. Shirlee Latch on 09/13/21 for further titration of methrotrexate.  -Decrease prednisone to 25 mg (2.5 tablets) daily for 4 weeks. You will see Dr. Shirlee Latch on 09/13/21 for further titration of the prednisone. -Call if you have questions about your medications.  See below for full titration plan of methotrexate and prednisone. Note, this is subject to change based off clinical symptoms and lab values.   Prednisone  07/21/21 - 08/17/21: prednisone 30 mg daily  08/18/21 - 09/14/21 : prednisone 25 mg daily  09/15/21 - 10/12/21: prednisone 20 mg daily  10/13/21 - 11/09/21: prednisone 15 mg daily 11/10/21 - 11/23/21: prednisone 10 mg per day; d/c Bactrim 11/24/21 - 12/07/21: prednisone 7.5 mg daily  12/08/21 - 12/21/21: prednisone 5 mg daily 12/22/21 - 01/04/22: prednisone 2.5 mg daily  01/05/22: stop prednisone   Methotrexate 07/21/21 - 07/28/21: methotrexate 10 mg (four 2.5 mg tablets) once weekly 08/04/21 - 08/11/21: methotrexate 12.5 mg (five 2.5 mg tablets) once weekly 08/18/21 - 08/25/21: methotrexate 15 mg (six 2.5 mg tablets) once weekly  09/01/21 - 09/08/21: methotrexate 17.5 mg (seven 2.5 mg tablets) once weekly 09/15/21 and thereafter: methotrexate 20 mg (eight 2.5 mg tablets ) once weekly  LABS: -We will call you if your labs need attention.  NEXT APPOINTMENT: Return to clinic in 4 weeks with Dr. Shirlee Latch.  In general, to take care of your heart failure: -Limit your fluid intake to 2 Liters (half-gallon) per day.   -Limit your salt intake to ideally 2-3 grams (2000-3000 mg) per day. -Weigh yourself daily and record, and bring that "weight diary" to your next appointment.  (Weight gain of 2-3 pounds in 1 day typically means fluid  weight.) -The medications for your heart are to help your heart and help you live longer.   -Please contact us before stopping any of your heart medications.  Call the clinic at [phone] with questions or to reschedule future appointments.

## 2021-07-31 NOTE — Progress Notes (Signed)
Advanced Heart Failure Clinic Note   PCP: Malva Limes, MD Cardiology: Dr. Mariah Milling HF Cardiology: Dr. Shirlee Latch    HPI:  51 y.o. with history of pulmonary sarcoidosis, asthma/COPD, and nonischemic cardiomyopathy presents for evaluation of CHF.  Patient was diagnosed with sarcoidosis after bronchoscopy and presumably a biopsy in [place] in 10/2010. He says that he took steroids and MTX x 1 year then was weaned off.  No immunosuppression since that time.  Echo in 09/2018 was normal.  He had an echo in 08/2020, after his 2nd bout with COVID-19, with EF down to 45-50%.  Given concern for cardiac involvement by sarcoidosis, he had a cardiac MRI in 10/2020.  Despite moderately decreased LV and RV systolic function, no delayed enhancement was seen.  He subsequently had a coronary CTA in 12/2020 that showed no significant coronary disease.  Patient also has a history of heavy smoking but quit in 2005.  His pulmonologist has said that he has asthma/COPD.     Repeat echo in 03/2021 showed EF 45-50%, dilated LV, normal RV, mild MR.    Cardiac PET in 06/2021 at [facility] (took a long time to get due to insurance issues) showed abnormal metabolism in the LV lateral wall consistent with mild-moderate inflammation from possible cardiac sarcoidosis, EF 53%; there were extracardiac hypermetabolic areas in the chest suggestive of sarcoidosis; focal FDG uptake distal esophagus, ?esophagitis versus malignancy.    Presented to AHF Clinic 07/20/21 with Dr. Shirlee Latch. Patient reported exertional dyspnea since his sarcoidosis diagnosis in 2012.  It has not changed much.  He reported getting short of breath walking up inclines or carrying heavy loads.  No orthopnea/PND.  No chest pain. No palpitations or lightheadedness.    Today he returns to HF clinic for pharmacist medication titration. At last visit with MD Methotrexate 10 mg weekly and prednisone 30 mg daily were initiated based of Cardiac Sarcoidosis Protocol. Overall he is  feeling ok today. States he may be a bit more winded. Now SOB when going up 2 flights of stairs. No dizziness, lightheadedness, chest pain or palpitations. Taking all medications as prescribed and tolerating all medications.    HF Medications: Metoprolol succinate 12.5 mg daily Entresto 24/26 mg (1/2 tablet) BID Jardiance 10 mg daily  Has the patient been experiencing any side effects to the medications prescribed?  no  Does the patient have any problems obtaining medications due to transportation or finances?   No; [employer].  Understanding of regimen: good Understanding of indications: good Potential of compliance: good Patient understands to avoid NSAIDs. Patient understands to avoid decongestants.    Pertinent Lab Values: CBC/diff and CMET pending.   Vital Signs: Weight: 172.2 lbs (last clinic weight: 171 lbs) Blood pressure: 120/74  Heart rate: 71   Assessment/Plan: 1. Chronic systolic CHF: Nonischemic cardiomyopathy.  Last echo in 08/2020 with EF 45-50%, cardiac MRI in 10/2020 with LV EF 36% with mild-moderate LV dilation, RV EF 33%, no LGE.  Coronary CTA in 12/2020 showed no significant CAD.  Given pulmonary sarcoidosis, cardiac sarcoidosis was a concern.  No delayed enhancement, however, was seen on cMRI.  Viral myocarditis from COVID-19 was also a concern, but again, no LGE was seen on cMRI.  Echo in 03/2021 showed EF up to 45-50%.  Cardiac PET in 06/2021 showed abnormal metabolism in the LV lateral wall consistent with mild-moderate inflammation from possible cardiac sarcoidosis, EF 53%.  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  - He is not volume overloaded on exam.  NYHA class II symptoms. - CMET and CBC/diff pending   - Cardiac meds limited by low BP.  - Increase methotrexate to 15 mg weekly (every Friday) and decrease prednisone to 25 mg daily per cardiac sarcoid protocol. Will add  full protocol to telephone note 08/17/21.  - Continue Bactrim for PJP prophylaxis until prednisone is <15 mg daily. Continue calcium/vitamin D/omeprazole/folic acid.   - Follow LFTs with MTX.  - Continue metoprolol XL 12.5 mg qhs.  - Continue Entresto 1/2 tab 24/26 mg BID.  - Continue Farxiga 10 mg daily - Repeat cardiac PET after we get to goal MTX.     2. Pulmonary sarcoidosis: Followed by pulmonary.  PET in 06/2021 showed hypermetabolic areas in chest consistent with pulmonary sarcoidosis.  Will review treatment plan with his pulmonologist.  3. Asthma/COPD: No longer smoking.  Followed by pulmonary.  4. Distal esophageal FDG uptake: This could be due to GERD/esophagitis but cannot rule out malignancy.  - Referred to GI for evaluation.  - Continue omeprazole.     Follow up in AHF Clinic in 4 weeks. Will plan on increasing Methotrexate to 20 mg every Friday and decreasing prednisone to 20 mg daily per protocol at that visit.    Karle Plumber, PharmD, BCPS, BCCP, CPP Heart Failure Clinic Pharmacist [phone]

## 2021-08-14 ENCOUNTER — Encounter: Payer: Self-pay | Admitting: Gastroenterology

## 2021-08-14 ENCOUNTER — Ambulatory Visit (INDEPENDENT_AMBULATORY_CARE_PROVIDER_SITE_OTHER): Payer: No Typology Code available for payment source | Admitting: Gastroenterology

## 2021-08-14 ENCOUNTER — Other Ambulatory Visit: Payer: Self-pay

## 2021-08-14 ENCOUNTER — Ambulatory Visit
Admission: RE | Admit: 2021-08-14 | Discharge: 2021-08-14 | Disposition: A | Discharge status: Home / Self Care | Payer: No Typology Code available for payment source | Source: Ambulatory Visit | Attending: Pulmonary Disease | Admitting: Pulmonary Disease

## 2021-08-14 VITALS — BP 118/70 | HR 76 | Ht 66.0 in | Wt 171.0 lb

## 2021-08-14 DIAGNOSIS — Z1211 Encounter for screening for malignant neoplasm of colon: Secondary | ICD-10-CM

## 2021-08-14 DIAGNOSIS — D860 Sarcoidosis of lung: Secondary | ICD-10-CM | POA: Diagnosis present

## 2021-08-14 DIAGNOSIS — Z1212 Encounter for screening for malignant neoplasm of rectum: Secondary | ICD-10-CM | POA: Diagnosis not present

## 2021-08-14 DIAGNOSIS — R933 Abnormal findings on diagnostic imaging of other parts of digestive tract: Secondary | ICD-10-CM

## 2021-08-14 MED ORDER — PLENVU 140 G PO SOLR: ORAL | 0 refills | Status: DC

## 2021-08-14 MED ORDER — IOHEXOL 300 MG/ML  SOLN
75.0000 mL | Freq: Once | INTRAMUSCULAR | Status: AC | PRN
  Administered 2021-08-14: 75 mL via INTRAVENOUS

## 2021-08-14 NOTE — Progress Notes (Signed)
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 Marca Ancona for further evaluation of abnormal PET scan.  The PET scan was obtained to assess for cardiac involvement of sarcoidosis, and incidentally noted increased uptake at the gastroesophageal junction.  He was recently started on prednisone and methotrexate for cardiac sarcoidosis.  He was initially diagnosed with sarcoidosis in 2012, at that time limited to the lungs.  He was treated with steroids and methotrexate for a year, but was off therapy for almost 10 years until this past December. He denies any upper GI symptoms to include heartburn, acid regurgitation, dysphagia, nausea/vomiting, dyspepsia/epigastric pain.Marland Kitchen   He was started on omeprazole in December because he was started on steroids.   His only symptom from sarcoidosis is shortness of breath.  Dyspnea has been stable since diagnosis.  Initially had symptoms of cough but this resolved. He takes Prednisone 30 mg PO daily.  He completed the methotrexate this past Friday. He has never been screened for colon cancer.  He denies any lower GI symptoms to include abdominal pain, constipation, diarrhea or hematochezia. No family history of GI malignancy.  Past Medical History:  Diagnosis Date   GERD (gastroesophageal reflux disease)    HFrEF (heart failure with reduced ejection fraction) (HCC)    a. 09/2018 Echo: EF 55-60%; b. 08/2020 Echo: EF 45-50%, grade 2 diastolic dysfunction; c. 10/2020 cMRI: EF 36%, mild to mod dil LV. Mod red RV fxn. No LGE/evidence of sarcoid. No signif valvular dzs   NICM (nonischemic cardiomyopathy) (HCC)    a. 09/2018 Echo: EF 55-60%; b. 08/2020 Echo: EF 45-50%; c. 10/2020 cMRI: EF 36%, mild to mod dil LV. Mod red RV fxn. No LGE/evidence of sarcoid. No signif valvular dzs; d. 12/2020 Cor CTA: Ca2+= 0. Nl cors.   Pulmonary nodule    a. 12/2020 CT chest: 1.3cm posterior basal  LUL nodule and 93mm LLL nodule - unchanged.   RBBB    Sarcoidosis    a. Dx 10/2020 in [place] - prev on steroids/methotrexate.     Past Surgical History:  Procedure Laterality Date   BRONCHOSCOPY  11/09/2010   [facility] [place]   Family History  Problem Relation Age of Onset   COPD Mother    Social History   Tobacco Use   Smoking status: Former    Packs/day: 1.00    Years: 25.00    Pack years: 25.00    Types: Cigarettes    Quit date: 07/23/2008    Years since quitting: 13.0   Smokeless tobacco: Former    Types: Chew    Quit date: 07/24/2020  Vaping Use   Vaping Use: Never used  Substance Use Topics   Alcohol use: Never   Drug use: Not Currently   Current Outpatient Medications  Medication Sig Dispense Refill   Calcium Citrate 333 MG TABS Take 4 tablets by mouth daily at 12 noon.     empagliflozin (JARDIANCE) 10 MG TABS tablet Take 1 tablet (10 mg total) by mouth daily before breakfast. 90 tablet 3   Fluticasone-Umeclidin-Vilant (TRELEGY ELLIPTA) 100-62.5-25 MCG/INH AEPB Inhale 1 puff into the lungs daily. As needed     folic acid (FOLVITE) 1 MG tablet Take 1 tablet (1 mg total) by mouth daily. 30 tablet 6   ibuprofen (ADVIL) 200 MG tablet Take 200 mg by mouth every 6 (six) hours as needed.     methotrexate (RHEUMATREX)  2.5 MG tablet Take 4 tabs weekly every Friday for 2 weeks, then take 5 tabs every Friday for 2 weeks. Caution:Chemotherapy. Protect from light. 18 tablet 0   metoprolol succinate (TOPROL XL) 25 MG 24 hr tablet Take 0.5 tablets (12.5 mg total) by mouth at bedtime. 45 tablet 3   omeprazole (PRILOSEC) 20 MG capsule Take 1 capsule (20 mg total) by mouth daily. 30 capsule 6   predniSONE (DELTASONE) 10 MG tablet Take 3 tablets (30 mg total) by mouth daily with breakfast. 60 tablet 0   sacubitril-valsartan (ENTRESTO) 24-26 MG Take 0.5 tablets by mouth 2 (two) times daily.     sulfamethoxazole-trimethoprim (BACTRIM DS) 800-160 MG tablet Take 1 tablet by mouth 3  (three) times a week. Every Mon, Wed, Fri 15 tablet 4   Vitamin D, Cholecalciferol, 25 MCG (1000 UT) CAPS Take 1 capsule by mouth daily. 60 capsule    No current facility-administered medications for this visit.   No Known Allergies   Review of Systems: All systems reviewed and negative except where noted in HPI.    No results found.  Physical Exam: There were no vitals taken for this visit. Constitutional: Pleasant,well-developed, Caucasian male in no acute distress. HEENT: Normocephalic and atraumatic. Conjunctivae are normal. No scleral icterus. MP2 Neck supple.  Cardiovascular: Normal rate, regular rhythm.  Pulmonary/chest: Effort normal and breath sounds normal. No wheezing, rales or rhonchi. Abdominal: Soft, nondistended, nontender. Bowel sounds active throughout. There are no masses palpable. No hepatomegaly. Extremities: no edema Neurological: Alert and oriented to person place and time. Skin: Skin is warm and dry. No rashes noted. Psychiatric: Normal mood and affect. Behavior is normal.  CBC    Component Value Date/Time   WBC 7.5 08/11/2020 1640   RBC 4.60 08/11/2020 1640   HGB 14.0 08/11/2020 1640   HCT 42.6 08/11/2020 1640   PLT 269 08/11/2020 1640   MCV 92.6 08/11/2020 1640   MCH 30.4 08/11/2020 1640   MCHC 32.9 08/11/2020 1640   RDW 12.0 08/11/2020 1640   LYMPHSABS 1.2 08/11/2020 1640   MONOABS 0.7 08/11/2020 1640   EOSABS 0.5 08/11/2020 1640   BASOSABS 0.1 08/11/2020 1640    CMP     Component Value Date/Time   NA 136 07/20/2021 1251   NA 140 06/25/2018 0322   K 4.3 07/20/2021 1251   CL 104 07/20/2021 1251   CO2 26 07/20/2021 1251   GLUCOSE 98 07/20/2021 1251   BUN 13 07/20/2021 1251   BUN 10 06/25/2018 0322   CREATININE 0.91 07/20/2021 1251   CALCIUM 9.3 07/20/2021 1251   PROT 7.3 06/25/2018 0322   ALBUMIN 4.5 06/25/2018 0322   AST 19 06/25/2018 0322   ALT 21 06/25/2018 0322   ALKPHOS 65 06/25/2018 0322   BILITOT 0.5 06/25/2018 0322    GFRNONAA >60 07/20/2021 1251   GFRAA 116 06/25/2018 0322   ECHOCARDIOGRAM REPORT       Patient Name:   Steve Wang Date of Exam: 04/04/2021  Medical Rec #:  [id number]   Height:       66.0 in  Accession #:    [id number]  Weight:       163.4 lb  Date of Birth:  05/30/1971   BSA:          1.835 m  Patient Age:    50 years    BP:           120/72 mmHg  Patient Gender: M  HR:           62 bpm.  Exam Location:  [place]   Procedure: 2D Echo, 3D Echo, Cardiac Doppler, Color Doppler and Strain  Analysis   Indications:   I25.5 Ischemic cardiomyopathy; I45.10 RBBB    History:        Patient has prior history of Echocardiogram examinations,  most                 recent 08/30/2020. CHF and Cardiomyopathy, COPD and Sarcoid  of                 lung, Arrythmias:RBBB; Risk Factors:Former Smoker.    Sonographer:    Quentin Ore RDMS, RVT, RDCS  Referring Phys: [id number] CHRISTOPHER RONALD BERGE   IMPRESSIONS     1. Left ventricular ejection fraction, by estimation, is 45 to 50%. The  left ventricle has mildly decreased function. The left ventricle  demonstrates global hypokinesis. The left ventricular internal cavity size  was moderately to severely dilated. Left  ventricular diastolic parameters are consistent with Grade II diastolic  dysfunction (pseudonormalization). The average left ventricular global  longitudinal strain is -13.5 %. The global longitudinal strain is  abnormal.   2. Right ventricular systolic function is normal. The right ventricular  size is normal. Tricuspid regurgitation signal is inadequate for assessing  PA pressure.   3. The mitral valve is abnormal. Mild mitral valve regurgitation.   4. The aortic valve is tricuspid. Aortic valve regurgitation is not  visualized. No aortic stenosis is present.   5. The inferior vena cava is normal in size with greater than 50%  respiratory variability, suggesting right atrial pressure of 3 mmHg.   FINDINGS   Left  Ventricle: Left ventricular ejection fraction, by estimation, is 45  to 50%. The left ventricle has mildly decreased function. The left  ventricle demonstrates global hypokinesis. The average left ventricular  global longitudinal strain is -13.5 %.  The global longitudinal strain is abnormal. The left ventricular internal  cavity size was moderately to severely dilated. There is no left  ventricular hypertrophy. Left ventricular diastolic parameters are  consistent with Grade II diastolic dysfunction  (pseudonormalization).   Right Ventricle: The right ventricular size is normal. No increase in  right ventricular wall thickness. Right ventricular systolic function is  normal. Tricuspid regurgitation signal is inadequate for assessing PA  pressure.   Left Atrium: Left atrial size was normal in size.   Right Atrium: Right atrial size was normal in size.   Pericardium: There is no evidence of pericardial effusion.   Mitral Valve: The mitral valve is abnormal. There is mild thickening of  the mitral valve leaflet(s). Mild mitral valve regurgitation.   Tricuspid Valve: The tricuspid valve is grossly normal. Tricuspid valve  regurgitation is not demonstrated.   Aortic Valve: The aortic valve is tricuspid. Aortic valve regurgitation is  not visualized. No aortic stenosis is present. Aortic valve mean gradient  measures 2.0 mmHg. Aortic valve peak gradient measures 3.7 mmHg. Aortic  valve area, by VTI measures 3.37  cm.   Pulmonic Valve: The pulmonic valve was normal in structure. Pulmonic valve  regurgitation is trivial. No evidence of pulmonic stenosis.   Aorta: The aortic root and ascending aorta are structurally normal, with  no evidence of dilitation.   Pulmonary Artery: The pulmonary artery is of normal size.   Venous: The inferior vena cava is normal in size with greater than 50%  respiratory variability, suggesting right atrial pressure of  3 mmHg.   IAS/Shunts: No atrial  level shunt detected by color flow Doppler.     LEFT VENTRICLE  PLAX 2D  LVIDd:         6.40 cm      Diastology  LVIDs:         4.90 cm      LV e' medial:    5.66 cm/s  LV PW:         0.70 cm      LV E/e' medial:  7.2  LV IVS:        0.60 cm      LV e' lateral:   6.74 cm/s  LVOT diam:     2.10 cm      LV E/e' lateral: 6.0  LV SV:         64  LV SV Index:   35           2D Longitudinal Strain  LVOT Area:     3.46 cm     2D Strain GLS Avg:     -13.5 %    LV Volumes (MOD)  LV vol d, MOD A2C: 103.0 ml 3D Volume EF:  LV vol d, MOD A4C: 156.0 ml 3D EF:        46 %  LV vol s, MOD A2C: 58.2 ml  LV EDV:       212 ml  LV vol s, MOD A4C: 77.3 ml  LV ESV:       115 ml  LV SV MOD A2C:     44.8 ml  LV SV:        97 ml  LV SV MOD A4C:     156.0 ml  LV SV MOD BP:      58.7 ml   RIGHT VENTRICLE             IVC  RV Basal diam:  3.90 cm     IVC diam: 1.70 cm  RV S prime:     10.10 cm/s  TAPSE (M-mode): 2.8 cm   LEFT ATRIUM             Index       RIGHT ATRIUM           Index  LA diam:        3.90 cm 2.13 cm/m  RA Area:     14.50 cm  LA Vol (A2C):   45.0 ml 24.52 ml/m RA Volume:   36.35 ml  19.81 ml/m  LA Vol (A4C):   48.9 ml 26.65 ml/m  LA Biplane Vol: 47.5 ml 25.88 ml/m   AORTIC VALVE                   PULMONIC VALVE  AV Area (Vmax):    3.26 cm    PV Vmax:       0.77 m/s  AV Area (Vmean):   3.37 cm    PV Peak grad:  2.4 mmHg  AV Area (VTI):     3.37 cm  AV Vmax:           95.70 cm/s  AV Vmean:          64.100 cm/s  AV VTI:            0.191 m  AV Peak Grad:      3.7 mmHg  AV Mean Grad:      2.0 mmHg  LVOT Vmax:  90.10 cm/s  LVOT Vmean:        62.300 cm/s  LVOT VTI:          0.186 m  LVOT/AV VTI ratio: 0.97    AORTA  Ao Root diam: 3.70 cm  Ao Asc diam:  3.40 cm  Ao Arch diam: 2.7 cm   MITRAL VALVE  MV Area (PHT): 2.38 cm    SHUNTS  MV Decel Time: 319 msec    Systemic VTI:  0.19 m  MV E velocity: 40.70 cm/s  Systemic Diam: 2.10 cm  MV A velocity: 40.70 cm/s  MV  E/A ratio:  1.00   Yvonne Kendall MD  Electronically signed by Yvonne Kendall MD  Signature Date/Time: 04/05/2021/8:00:42 AM        Final      Results - PET myocardial with concurrent CT (07/06/2021 2:02 PM EST) Narrative  07/07/2021 3:08 PM EST   Table formatting from the original result was not included.   Myocardial Perfusion Report   ISAC, LINCKS Exam Date: 07/06/2021 10:16 Ordering Phys: Laurey Morale MRN: [id number] Gender: M Exam Location: [facility] Referring Phys: Laurey Morale Age: 2 DOB: 1971-01-09 Ht (cm): 170 Wt (kg): 75 BMI: 25.95 Technologist: Silas Flood, CNMT Procedure CPT: [phone]  History and Risk Factors: Patient History: Outside referral from [facility] for cardiac PET/CT sarcoid evaluation. h/o NICM, sarcoidosis with pulmonary involvement in the past, RBBB on ECG, GERD, remote tobacco, Cardiac History: Cardiac MRI at [facility] 10/2020- LVEF 36%, moderate decreased RV function, no LGE/evidence of sarcoid Smoking. Procedure Date  Cardiac MRI 10/2020   Cardiac Meds: Metoprolol. Entresto. Meds past 24 Metoprolol. Entresto. Jardiance. Trelegy Ellipta. hrs: Pretest Chest Pain: None Hours NPO: 18 Hours without caffeine: Comparison to prior studies:   No Prior Studies For Comparison.   Image Analysis:   Rest Reinject. Conclusion Basal Anterior Normal Mild See Below Basal Ant Septal Normal Normal Normal Basal Inf Septal Mild Normal See Below Basal Inferior Moderate Normal See Below Basal Inf Lat Mild Moderate See Below Basal Ant Lat Normal Mild See Below Mid Anterior Normal Normal Normal Mid Ant Septal Normal Normal Normal Mid Inf Septal Mild Normal See Below Mid Inferior Moderate Normal See Below Mid Inf Lateral Normal Mild See Below Mid Ant Lateral Normal Normal Normal Apical Anterior Normal Normal Normal Apical Septal Normal Normal Normal Apical Inferior Normal Normal Normal Apical Lateral Normal Normal Normal Apex Normal Normal Normal SPECT  RESULTS Technical Quality: Excellent Raw Data Analysis: Adequate  PERFUSION: Rest Gated PET/CT Cardiac Imaging Study Reveals Normal Perfusion to The Myocardium Suggestive of "Scar/Fibrosis" From Prevous Inflammation.   IMAGE PROTOCOL Sarcoid Modality: PET- Discovery MI Radiopharmaceutical Dose (mCi) Imaging  Date Inj Time Img Time Rescan Reason Rescan Time Rest: RB-82 Rubidium, IV 29.99 07/06/2021 1046 1046 Rest Administration Site: Left antecubital fossa Tech Administering Rest Dose: Silas Flood, CNMT Stress Administration Site: Tech Administering Stress Dose: Karie Chimera, CNMT  FUNCTIONAL RESULTS (calculated via Gated SPECT) Rest Image LV EF: 53 % Rest Image LV EF: Rest EDV: 150 ml EDVI: 79 ml/m Rest ESV: 71 ml ESVI: 37 ml/m        Wall Motion   Wall Thickening Anterior: Normal Normal Apex: Mild - Hypokinetic Normal Inferior: Normal Normal Lateral: Normal Normal Septal: Mild - Hypokinetic Normal  LV Ejection Fraction  & Size: Gated Cardiac PET/CT Functional Study Demonstrate Normal Left Ventricular Ejection Fraction and Increased Volumes.   LV Function & Wall Thickening: Gated Cardiac PET/CT Functional Study Demonstrate Normal Left  Ventricular Systolic Function, Wall Motion and Thickening. Right Ventriclar Analysis: No Evidence of Increased Tracer Rb-82 Uptake in The Right Ventricular Free Wall to Suggest RVH.  FINAL COMMENTS   Metabolism  1- Cardiac PET/CT Metabolic Study with F-18 FDG 15. 45 mCi given IV and Scanned on PET Discovery MI: There are segments with abnormal mild to moderate metabolism mostly in the lateral wall. This indicates presence of inflammation/active inflammatory process in the LV myocardium. In the proper clinical and biomarkers settings may represent active cardiac sarcoidosis. No evidence for abnormal metabolism in the RV free wall.  Perfusion/Function  Gated Cardiac PET/CT Rest Myocardial Perfusion Study with Rb-82 Reveals: 1-Abnormal  Perfusion to The Myocardium. 2- Normal Left Ventricular Ejection Fraction at Rest as Well as Increased Left Ventricular Volumes. 3-Abnormal Wall Motion and Normal Thickening. 4-No Evidence of Increased Tracer Uptake Rb-82 in The Right Ventricular Free Wall to Suggest RVH.  2- Cardiac CT Viewer: No Evidence of Coronary Artery Calcifications.  3- Limited Non-Diagnostic View of Lower Chest and Upper Abdomen PET/CT: Evidence of extra cardiac areas in the chest of hypermetabolism suggest sarcoidosis. Focal Increased FDG uptake in the distal esophagus. This could represent esophagitis. However, malignancy can not be excluded. Recommend  dedicated diagnostic Chest CT with contrast and local visualization.    Electronically Reviewed by: Nadara Mode M.D. (Electronically Signed) Final Date: 07 July 2021 15:07   ASSESSMENT AND PLAN: 51 year old male with sarcoidosis involving the heart and lungs, found to have increased uptake at the GEJ on recent PET, also due for initial average risk screening colonoscopy.  He has no chronic GI symptoms.  His most recent EF was 50-55% in Sept 2022.  He has stable dyspnea, but is able to go up 2 flights of stairs.  Patient is a reasonable candidate for EGD/Colonoscopy at the [facility]  Abnormal PET (increased uptake at [facility]) - EGD  Colon cancer screening - Colonoscopy  The details, risks (including bleeding, perforation, infection, missed lesions, medication reactions and possible hospitalization or surgery if complications occur), benefits, and alternatives to EGD/colonoscopy with possible biopsy and possible polypectomy were discussed with the patient and he consents to proceed.    Dailon Sheeran E. Tomasa Rand, MD [facility]   CC:  Malva Limes, MD

## 2021-08-14 NOTE — Patient Instructions (Signed)
If you are age 49 or older, your body mass index should be between 23-30. Your Body mass index is 27.6 kg/m. If this is out of the aforementioned range listed, please consider follow up with your Primary Care Provider.  If you are age 44 or younger, your body mass index should be between 19-25. Your Body mass index is 27.6 kg/m. If this is out of the aformentioned range listed, please consider follow up with your Primary Care Provider.   You have been scheduled for an endoscopy and colonoscopy. Please follow the written instructions given to you at your visit today. Please pick up your prep supplies at the pharmacy within the next 1-3 days. If you use inhalers (even only as needed), please bring them with you on the day of your procedure.   The [facility] providers would like to encourage you to use [facility] to communicate with providers for non-urgent requests or questions.  Due to long hold times on the telephone, sending your provider a message by [facility] may be a faster and more efficient way to get a response.  Please allow 48 business hours for a response.  Please remember that this is for non-urgent requests.   It was a pleasure to see you today!  Thank you for trusting me with your gastrointestinal care!    Scott E.Tomasa Rand, MD

## 2021-08-15 ENCOUNTER — Telehealth: Payer: Self-pay

## 2021-08-15 NOTE — Telephone Encounter (Signed)
Called pt without the realization that patient had been already called about test results. Nothing further needed.

## 2021-08-17 ENCOUNTER — Ambulatory Visit (HOSPITAL_COMMUNITY)
Admission: RE | Admit: 2021-08-17 | Discharge: 2021-08-17 | Disposition: A | Discharge status: Home / Self Care | Payer: No Typology Code available for payment source | Source: Ambulatory Visit | Attending: Internal Medicine | Admitting: Internal Medicine

## 2021-08-17 ENCOUNTER — Other Ambulatory Visit: Payer: Self-pay

## 2021-08-17 ENCOUNTER — Telehealth (HOSPITAL_COMMUNITY): Payer: Self-pay | Admitting: Pharmacist

## 2021-08-17 VITALS — BP 120/74 | HR 71 | Wt 172.2 lb

## 2021-08-17 DIAGNOSIS — Z7984 Long term (current) use of oral hypoglycemic drugs: Secondary | ICD-10-CM | POA: Diagnosis not present

## 2021-08-17 DIAGNOSIS — R0602 Shortness of breath: Secondary | ICD-10-CM | POA: Diagnosis present

## 2021-08-17 DIAGNOSIS — Z7901 Long term (current) use of anticoagulants: Secondary | ICD-10-CM | POA: Diagnosis not present

## 2021-08-17 DIAGNOSIS — J449 Chronic obstructive pulmonary disease, unspecified: Secondary | ICD-10-CM | POA: Insufficient documentation

## 2021-08-17 DIAGNOSIS — Z79899 Other long term (current) drug therapy: Secondary | ICD-10-CM | POA: Insufficient documentation

## 2021-08-17 DIAGNOSIS — I428 Other cardiomyopathies: Secondary | ICD-10-CM | POA: Insufficient documentation

## 2021-08-17 DIAGNOSIS — I5022 Chronic systolic (congestive) heart failure: Secondary | ICD-10-CM | POA: Insufficient documentation

## 2021-08-17 DIAGNOSIS — Z87891 Personal history of nicotine dependence: Secondary | ICD-10-CM | POA: Diagnosis not present

## 2021-08-17 DIAGNOSIS — D8685 Sarcoid myocarditis: Secondary | ICD-10-CM | POA: Diagnosis not present

## 2021-08-17 DIAGNOSIS — Z8616 Personal history of COVID-19: Secondary | ICD-10-CM | POA: Insufficient documentation

## 2021-08-17 LAB — COMPREHENSIVE METABOLIC PANEL
ALT: 23 U/L (ref 0–44)
AST: 16 U/L (ref 15–41)
Albumin: 4.1 g/dL (ref 3.5–5.0)
Alkaline Phosphatase: 46 U/L (ref 38–126)
Anion gap: 9 (ref 5–15)
BUN: 16 mg/dL (ref 6–20)
CO2: 23 mmol/L (ref 22–32)
Calcium: 9 mg/dL (ref 8.9–10.3)
Chloride: 105 mmol/L (ref 98–111)
Creatinine, Ser: 0.98 mg/dL (ref 0.61–1.24)
GFR, Estimated: >60 mL/min (ref >60)
Glucose, Bld: 115 mg/dL — ABNORMAL HIGH (ref 70–99)
Potassium: 4.5 mmol/L (ref 3.5–5.1)
Sodium: 137 mmol/L (ref 135–145)
Total Bilirubin: 0.7 mg/dL (ref 0.3–1.2)
Total Protein: 7.1 g/dL (ref 6.5–8.1)

## 2021-08-17 LAB — CBC WITH DIFFERENTIAL/PLATELET
Abs Immature Granulocytes: 0.06 10*3/uL (ref 0.00–0.07)
Basophils Absolute: 0 10*3/uL (ref 0.0–0.1)
Basophils Relative: 0 %
Eosinophils Absolute: 0.1 10*3/uL (ref 0.0–0.5)
Eosinophils Relative: 1 %
HCT: 47.5 % (ref 39.0–52.0)
Hemoglobin: 15.7 g/dL (ref 13.0–17.0)
Immature Granulocytes: 1 %
Lymphocytes Relative: 9 %
Lymphs Abs: 0.7 10*3/uL (ref 0.7–4.0)
MCH: 30.8 pg (ref 26.0–34.0)
MCHC: 33.1 g/dL (ref 30.0–36.0)
MCV: 93.3 fL (ref 80.0–100.0)
Monocytes Absolute: 0.4 10*3/uL (ref 0.1–1.0)
Monocytes Relative: 4 %
Neutro Abs: 6.9 10*3/uL (ref 1.7–7.7)
Neutrophils Relative %: 85 %
Platelets: 190 10*3/uL (ref 150–400)
RBC: 5.09 MIL/uL (ref 4.22–5.81)
RDW: 13.4 % (ref 11.5–15.5)
WBC: 8.1 10*3/uL (ref 4.0–10.5)
nRBC: 0 % (ref 0.0–0.2)

## 2021-08-17 MED ORDER — METOPROLOL SUCCINATE ER 25 MG PO TB24: 12.5000 mg | ORAL_TABLET | Freq: Every day | ORAL | 3 refills | Status: DC

## 2021-08-17 MED ORDER — METHOTREXATE 2.5 MG PO TABS: ORAL_TABLET | ORAL | 1 refills | Status: DC

## 2021-08-17 MED ORDER — PREDNISONE 10 MG PO TABS: 25.0000 mg | ORAL_TABLET | Freq: Every day | ORAL | 1 refills | Status: DC

## 2021-08-17 NOTE — Telephone Encounter (Signed)
Patient is continuing methotrexate and prednisone for cardiac sarcoidosis. Titration protocol listed below. Patient was provided with this individualized protocol.    Cardiac Sarcoidosis Treatment Protocol Start date 07/21/21  Prednisone  07/21/21 - 08/17/21: prednisone 30 mg daily  08/18/21 - 09/14/21 : prednisone 25 mg daily  09/15/21 - 10/12/21: prednisone 20 mg daily  10/13/21 - 11/09/21: prednisone 15 mg daily 11/10/21 - 11/23/21: prednisone 10 mg per day; d/c Bactrim 11/24/21 - 12/07/21: prednisone 7.5 mg daily  12/08/21 - 12/21/21: prednisone 5 mg daily 12/22/21 - 01/04/22: prednisone 2.5 mg daily  01/05/22: stop prednisone   Methotrexate 07/21/21 - 07/28/21: methotrexate 10 mg (four 2.5 mg tablets) once weekly 08/04/21 - 08/11/21: methotrexate 12.5 mg (five 2.5 mg tablets) once weekly 08/18/21 - 08/25/21: methotrexate 15 mg (six 2.5 mg tablets) once weekly  09/01/21 - 09/08/21: methotrexate 17.5 mg (seven 2.5 mg tablets) once weekly 09/15/21 and thereafter: methotrexate 20 mg (eight 2.5 mg tablets ) once weekly  Karle Plumber, PharmD, BCPS, BCCP, CPP Heart Failure Clinic Pharmacist [phone]

## 2021-08-18 ENCOUNTER — Telehealth (HOSPITAL_COMMUNITY): Payer: Self-pay | Admitting: Pharmacist

## 2021-08-18 ENCOUNTER — Encounter (HOSPITAL_COMMUNITY): Payer: Self-pay

## 2021-08-18 MED ORDER — SULFAMETHOXAZOLE-TRIMETHOPRIM 800-160 MG PO TABS: 1.0000 | ORAL_TABLET | ORAL | 4 refills | Status: DC

## 2021-08-18 NOTE — Telephone Encounter (Signed)
This interaction is considered significant for patient's on high-dose methotrexate for oncology indications. The interaction is not considered significant for the lower antirheumatic methotrexate doses. He can continue omeprazole and methotrexate at the current doses. Pantoprazole would have the same listed interaction so switching would not help. Spoke with patient and he expressed understanding.

## 2021-08-18 NOTE — Telephone Encounter (Signed)
Refill for Bactrim sent to [facility] per patient request.   Audry Riles, PharmD, BCPS, BCCP, CPP Heart Failure Clinic Pharmacist [phone]

## 2021-08-23 ENCOUNTER — Ambulatory Visit (HOSPITAL_COMMUNITY): Payer: No Typology Code available for payment source

## 2021-08-28 ENCOUNTER — Encounter (HOSPITAL_COMMUNITY): Payer: Self-pay

## 2021-08-29 ENCOUNTER — Encounter: Payer: Self-pay | Admitting: Pulmonary Disease

## 2021-08-29 ENCOUNTER — Encounter (HOSPITAL_COMMUNITY): Payer: Self-pay

## 2021-08-29 ENCOUNTER — Ambulatory Visit (INDEPENDENT_AMBULATORY_CARE_PROVIDER_SITE_OTHER): Payer: BC Managed Care – PPO | Admitting: Pulmonary Disease

## 2021-08-29 ENCOUNTER — Other Ambulatory Visit: Payer: Self-pay

## 2021-08-29 VITALS — BP 102/80 | HR 80 | Temp 97.5°F | Ht 66.0 in | Wt 170.8 lb

## 2021-08-29 DIAGNOSIS — J439 Emphysema, unspecified: Secondary | ICD-10-CM

## 2021-08-29 DIAGNOSIS — D8685 Sarcoid myocarditis: Secondary | ICD-10-CM | POA: Diagnosis not present

## 2021-08-29 DIAGNOSIS — D860 Sarcoidosis of lung: Secondary | ICD-10-CM | POA: Diagnosis not present

## 2021-08-29 MED ORDER — BREZTRI AEROSPHERE 160-9-4.8 MCG/ACT IN AERO: 2.0000 | INHALATION_SPRAY | Freq: Two times a day (BID) | RESPIRATORY_TRACT | 0 refills | Status: DC

## 2021-08-29 NOTE — Telephone Encounter (Signed)
Dr. Gonzalez, please advise. Thanks 

## 2021-08-29 NOTE — Progress Notes (Signed)
Subjective:    Patient ID: Steve Wang, male    DOB: [birth year], 51 y.o.   MRN: [id number] Chief Complaint  Patient presents with   Follow-up   Requesting MD/Service: Self, primary care physician [name], MD Date of initial consultation: 11 August 2020 Reason for consultation: History of sarcoidosis, GERD issues   PT PROFILE: 51 year old former smoker with biopsy-proven sarcoidosis previously diagnosed to managed at [facility] [place].  Patient now with dyspnea on exertion.   DATA: 10/13/2010 CT chest: Report only, performed at [facility] [place]: Upper lung predominant parenchymal changes, scarring and traction bronchiectasis with small peribronchial and perifissural nodules.  Enlarged bilateral hilar and mediastinal lymph nodes, findings all suggestive of sarcoidosis. 11/03/2010: ACE level 98, eosinophils elevated 8.1% 11/09/2010 Bronchoscopy specimens: [facility] [place], noncaseating granulomas right upper lobe, fungal culture negative for nocardia, Doratomyces species isolated, contaminant.  BAL 63% histiocytes, 33% lymphocytes, 2% neutrophils, 2% eosinophils.  Flow cytometry favored sarcoidosis. 09/21/2013 spirometry: Performed at [facility], FEV1 2.72 L or 76% predicted, FVC 4.42 L or 95% predicted, FEV1/FVC 61%, DLCO moderately reduced.  Consistent with mild obstructive defect (not congruent with sarcoidosis). 09/22/2018 2D echo: Performed in [facility], [place], LVEF 55 to 60% mild LV dilatation, no wall motion abnormalities, otherwise normal. 08/11/2020 angiotensin-converting enzyme: 61 (reference range 14 to 82 units/L) 08/30/2020 CT chest with contrast: Perihilar traction bronchiectasis, parenchymal retraction and architectural distortion consistent with history of sarcoid.  Left ventricular dilatation.  3 mm left lower lobe nodule. 08/30/2020 echocardiogram: LVEF 45 to 50%, LV with mildly decreased function, global hypokinesis, LV cavity size severely  dilated, grade II diastolic dysfunction. 10/24/2020 cardiac morphology MR: Mild to moderate dilated left ventricle, normal LV thickness, global hypokinesis, no regional wall motion abnormalities.  Reduced RV and LV systolic function LVEF 36%.  No evidence for infiltrative disease.  Negative for cardiac sarcoidosis. 12/15/2020 CT coronary morphology: Coronary calcium score of 0, no evidence of CAD.  Lung windows findings as previous. 07/06/2021 myocardial PET/CT [facility]): Abnormal perfusion of the myocardium, normal wall motion and normal thickening.  No coronary artery calcifications, evidence of extracardiac areas in the chest of hypermetabolism suggest sarcoidosis.  Focal increased FDG uptake in the distal esophagus query esophagitis. 08/14/2021 CT chest with contrast: Stable exam from prior, no progressive findings, architectural distortion and scarring bilaterally compatible with prior history of sarcoidosis.  Stable 3 mm left lower lobe pulmonary nodule.  Pulmonary emphysema.   INTERVAL: Patient first evaluated here on 11 August 2020.  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  He is currently on methotrexate and prednisone for his cardiac sarcoid.  He is also on cardiac medications.  HPI [name] presents for follow-up.  Recall that we initially saw him in January 2022 as he wanted to establish with pulmonary due to having had a diagnosis of sarcoidosis.  He was initially diagnosed in the at the [facility] [place].  He had been off of sarcoid treatment and appeared to have been in remission.  He complained of dyspnea out of proportion to the findings on CT of the chest and physical examination and therefore echocardiogram was ordered.  This showed significant changes from prior with decreased LVEF, LV cavity size severely dilated and grade 2 diastolic dysfunction.  He was referred to cardiology with the suspicion of cardiac  sarcoid.  This has been confirmed by recent myocardial PET CT performed at [facility] in December 2022.  We have  rechecked a CT chest with contrast after PET/CT noted above showed perhaps some activity consistent with pulmonary involvement however pulmonary findings on chest CT are static with no change.  In addition the PET/CT also showed potential activity in the esophagus consistent with esophagitis, on the dedicated chest CT however, there were no abnormalities noted on the esophagus.  Is asymptomatic with regards to GERD.  He continues to have issues with shortness of breath.  He is currently not on any inhalers.  In the past we have given him a trial of Trelegy but he did not feel that this was helpful.  Patient also requests today a referral to [facility] for his cardiac sarcoid.  He seemed very pleased with this set up at [facility] particularly for his cardiac PET/CT.  Prior pulmonary function testing had shown evidence of obstructive defect, of note he does have emphysema noted as well on his CT.  He is a former smoker.   Review of Systems A 10 point review of systems was performed and it is as noted above otherwise negative.  Patient Active Problem List   Diagnosis Date Noted   Right bundle branch block 09/10/2018   GERD (gastroesophageal reflux disease) 06/25/2018   Sarcoidosis of lung (HCC) 06/25/2018   Chronic right shoulder pain 06/25/2018   History of migraine 06/25/2018   History of kidney stones 06/25/2018   Social History   Tobacco Use   Smoking status: Former    Packs/day: 1.00    Years: 25.00    Pack years: 25.00    Types: Cigarettes    Quit date: 07/23/2008    Years since quitting: 13.1   Smokeless tobacco: Former    Types: Chew    Quit date: 07/24/2020  Substance Use Topics   Alcohol use: Never   No Known Allergies  Current Meds  Medication Sig   Calcium Citrate 333 MG TABS Take 4 tablets by mouth daily at 12 noon.   empagliflozin (JARDIANCE) 10 MG TABS tablet  Take 1 tablet (10 mg total) by mouth daily before breakfast.       folic acid (FOLVITE) 1 MG tablet Take 1 tablet (1 mg total) by mouth daily.   ibuprofen (ADVIL) 200 MG tablet Take 200 mg by mouth every 6 (six) hours as needed.   methotrexate (RHEUMATREX) 2.5 MG tablet Take 15 mg (6 tabs) weekly every Friday for 2 weeks, then take 17.5 mg (7 tabs) every Friday for 2 weeks, then take 30 mg (8 tabs) every Friday.   metoprolol succinate (TOPROL XL) 25 MG 24 hr tablet Take 0.5 tablets (12.5 mg total) by mouth at bedtime.   omeprazole (PRILOSEC) 20 MG capsule Take 1 capsule (20 mg total) by mouth daily.   PEG-KCl-NaCl-NaSulf-Na Asc-C (PLENVU) 140 g SOLR Manufacturer's coupon Universal coupon code:BIN: G6837245; GROUP: [id number]; PCN: [id number]; ID: [id number]; PAY NO MORE $50; NO prior authorization   predniSONE (DELTASONE) 10 MG tablet Take 2.5 tablets (25 mg total) by mouth daily with breakfast.   sacubitril-valsartan (ENTRESTO) 24-26 MG Take 0.5 tablets by mouth 2 (two) times daily.   sulfamethoxazole-trimethoprim (BACTRIM DS) 800-160 MG tablet Take 1 tablet by mouth 3 (three) times a week. Every Mon, Wed, Fri   Vitamin D, Cholecalciferol, 25 MCG (1000 UT) CAPS Take 1 capsule by mouth daily.   Immunization History  Administered Date(s) Administered   Influenza,inj,Quad PF,6+ Mos 09/08/2012   Influenza-Unspecified 05/07/2011   Pneumococcal Polysaccharide-23 06/25/2018   Tdap 03/24/2013, 06/25/2018      Objective:  Physical Exam BP 102/80 (BP Location: Left Arm, Patient Position: Sitting, Cuff Size: Normal)    Pulse 80    Temp (!) 97.5 F (36.4 C) (Oral)    Ht 5\' 6"  (1.676 m)    Wt 170 lb 12.8 oz (77.5 kg)    SpO2 98%    BMI 27.57 kg/m  GENERAL: Well-developed, well-nourished gentleman, no acute distress, fully ambulatory, no conversational dyspnea. HEAD: Normocephalic, atraumatic.  EYES: Pupils equal, round, reactive to light.  No scleral icterus.  MOUTH: Nose/mouth/throat not examined due to  masking requirements for COVID 19. NECK: Supple. No thyromegaly. Trachea midline. No JVD.  No adenopathy. PULMONARY: Good air entry bilaterally.  Somewhat coarse breath sounds with end expiratory wheezes otherwise, no adventitious sounds.  CARDIOVASCULAR: S1 and S2. Regular rate and rhythm.  No rubs, murmurs or gallops heard. ABDOMEN: Benign. MUSCULOSKELETAL: No joint deformity, no clubbing, no edema.  NEUROLOGIC: No overt focal deficit, no gait disturbance noted.  Speech is fluent. SKIN: Intact,warm,dry.  On limited exam no rashes. PSYCH: Mood and behavior normal.   Representative image from the CT scan of the chest performed 14 August 2021, this was independently reviewed, and reviewed with the patient:      Assessment & Plan:     ICD-10-CM   1. Cardiac sarcoidosis  D86.85 Ambulatory referral to Cardiology    CANCELED: Ambulatory referral to Cardiology   Currently on methotrexate and prednisone Patient desires referral to [facility]    2. Sarcoidosis of lung (HCC)  D86.0 Pulmonary Function Test [facility] Only    Ambulatory referral to Cardiology   Currently involvement in the lung appears quiescent    3. Pulmonary emphysema, unspecified emphysema type (HCC)  J43.9 Pulmonary Function Test [facility] Only    Ambulatory referral to Cardiology   Former smoker Trial of Breztri 2 puffs twice a day PFTs     Meds ordered this encounter  Medications   DISCONTD: Budeson-Glycopyrrol-Formoterol (BREZTRI AEROSPHERE) 160-9-4.8 MCG/ACT AERO    Sig: Inhale 2 puffs into the lungs in the morning and at bedtime.    Dispense:  5.9 g    Refill:  0    Order Specific Question:   Lot Number?    Answer:   [id number]    Order Specific Question:   Expiration Date?    Answer:   03/22/2024    Order Specific Question:   Manufacturer?    Answer:   [employer] [[id number]]    Order Specific Question:   NDC    Answer:   8527-7824-23 [[id number]]    Order Specific Question:   Quantity    Answer:   2    Orders Placed This Encounter  Procedures   Ambulatory referral to Cardiology    Referral Priority:   Routine    Referral Type:   Consultation    Referral Reason:   Specialty Services Required    Referred to Provider:   Jenel Lucks, MD    Requested Specialty:   Cardiology    Number of Visits Requested:   1   Pulmonary Function Test [facility] Only    Standing Status:   Future    Standing Expiration Date:   08/29/2022    Order Specific Question:   Full PFT: includes the following: basic spirometry, spirometry pre & post bronchodilator, diffusion capacity (DLCO), lung volumes    Answer:   Full PFT    Order Specific Question:   This test can only be performed at    Answer:   [facility]    We will see the patient in follow-up in 2 to 3 months time call sooner should any new problems arise.   Gailen Shelter, MD Advanced Bronchoscopy PCCM [facility]    *This note was dictated using voice recognition software/Dragon.  Despite best efforts to proofread, errors can occur which can change the meaning. Any transcriptional errors that result from this process are unintentional and may not be fully corrected at the time of dictation.

## 2021-08-29 NOTE — Telephone Encounter (Signed)
The PET/CT showed only minimal activity likely related to esophagitis.  The CT scan of the chest did not show any esophageal abnormalities.  I would recommend that he avoid nonsteroidals and aspirin as these can create inflammation in the esophagus and the stomach.

## 2021-08-29 NOTE — Patient Instructions (Addendum)
We are going to get pulmonary function test this will help [place] determine where your pulmonary function inside.  I am giving you a trial of Breztri this is an inhaler you can take 2 puffs twice a day sure you rinse your mouth well after you use it.  We are sending a referral to [facility] cardiology for your cardiac Sarcoid.  If you have not heard from [facility] in 7 to 10 business days please call us back so we can inquire as to what is the delay.  We will see him in follow-up in 2 to 3 months time call sooner should any new problems arise.

## 2021-08-29 NOTE — Progress Notes (Deleted)
° °  Subjective:    Patient ID: Steve Wang, male    DOB: 1971/06/30, 51 y.o.   MRN: [id number]  HPI    Review of Systems     Objective:   Physical Exam        Assessment & Plan:

## 2021-08-30 ENCOUNTER — Encounter: Payer: Self-pay | Admitting: Pulmonary Disease

## 2021-08-30 MED ORDER — SPIRIVA RESPIMAT 2.5 MCG/ACT IN AERS: 2.0000 | INHALATION_SPRAY | Freq: Every day | RESPIRATORY_TRACT | 0 refills | Status: DC

## 2021-08-30 MED ORDER — STIOLTO RESPIMAT 2.5-2.5 MCG/ACT IN AERS: 2.0000 | INHALATION_SPRAY | Freq: Every day | RESPIRATORY_TRACT | 0 refills | Status: DC

## 2021-08-30 NOTE — Telephone Encounter (Signed)
Dr. Gonzalez, please advise. thanks 

## 2021-08-30 NOTE — Telephone Encounter (Signed)
Yes best not to use it.  What we will do is just try some plain Spiriva that should not affect him cardiac wise as the Breztri does.  We could provide him with some samples of the 2.5 mcg and see how he does that would be 2 puffs once a day.

## 2021-08-31 ENCOUNTER — Encounter: Payer: Self-pay | Admitting: Pulmonary Disease

## 2021-09-01 NOTE — Telephone Encounter (Signed)
[name]  P [facility] (supporting Salena Saner, MD) 13 minutes ago (10:58 AM)   Thank you ,  I had one other question about my pet scan, Doctor Marcos Eke had told me that the one 3 mm spot on my lung was benign I wondered if she saw anything or thought of anything about the other one  Dr. Jayme Cloud, please advise. Thanks

## 2021-09-01 NOTE — Telephone Encounter (Signed)
On the follow up CT to the PET scan no other nodules were seen with the exception of the STABLE 17mm nodule already mentioned.

## 2021-09-13 ENCOUNTER — Ambulatory Visit (HOSPITAL_COMMUNITY)
Admission: RE | Admit: 2021-09-13 | Discharge: 2021-09-13 | Disposition: A | Discharge status: Home / Self Care | Payer: BC Managed Care – PPO | Source: Ambulatory Visit | Attending: Cardiology | Admitting: Cardiology

## 2021-09-13 ENCOUNTER — Encounter (HOSPITAL_COMMUNITY): Payer: Self-pay | Admitting: Cardiology

## 2021-09-13 ENCOUNTER — Other Ambulatory Visit: Payer: Self-pay

## 2021-09-13 VITALS — BP 100/60 | HR 70 | Wt 173.8 lb

## 2021-09-13 DIAGNOSIS — Z7952 Long term (current) use of systemic steroids: Secondary | ICD-10-CM | POA: Insufficient documentation

## 2021-09-13 DIAGNOSIS — I420 Dilated cardiomyopathy: Secondary | ICD-10-CM | POA: Diagnosis not present

## 2021-09-13 DIAGNOSIS — Z09 Encounter for follow-up examination after completed treatment for conditions other than malignant neoplasm: Secondary | ICD-10-CM | POA: Diagnosis not present

## 2021-09-13 DIAGNOSIS — Z8616 Personal history of COVID-19: Secondary | ICD-10-CM | POA: Diagnosis not present

## 2021-09-13 DIAGNOSIS — I428 Other cardiomyopathies: Secondary | ICD-10-CM | POA: Insufficient documentation

## 2021-09-13 DIAGNOSIS — Z87891 Personal history of nicotine dependence: Secondary | ICD-10-CM | POA: Diagnosis not present

## 2021-09-13 DIAGNOSIS — K219 Gastro-esophageal reflux disease without esophagitis: Secondary | ICD-10-CM | POA: Diagnosis not present

## 2021-09-13 DIAGNOSIS — Z7984 Long term (current) use of oral hypoglycemic drugs: Secondary | ICD-10-CM | POA: Insufficient documentation

## 2021-09-13 DIAGNOSIS — Z79899 Other long term (current) drug therapy: Secondary | ICD-10-CM | POA: Diagnosis not present

## 2021-09-13 DIAGNOSIS — I5022 Chronic systolic (congestive) heart failure: Secondary | ICD-10-CM | POA: Insufficient documentation

## 2021-09-13 DIAGNOSIS — D860 Sarcoidosis of lung: Secondary | ICD-10-CM | POA: Diagnosis not present

## 2021-09-13 DIAGNOSIS — J449 Chronic obstructive pulmonary disease, unspecified: Secondary | ICD-10-CM | POA: Insufficient documentation

## 2021-09-13 DIAGNOSIS — D8685 Sarcoid myocarditis: Secondary | ICD-10-CM

## 2021-09-13 DIAGNOSIS — I5020 Unspecified systolic (congestive) heart failure: Secondary | ICD-10-CM | POA: Diagnosis not present

## 2021-09-13 DIAGNOSIS — R0602 Shortness of breath: Secondary | ICD-10-CM | POA: Insufficient documentation

## 2021-09-13 LAB — COMPREHENSIVE METABOLIC PANEL
ALT: 23 U/L (ref 0–44)
AST: 17 U/L (ref 15–41)
Albumin: 4 g/dL (ref 3.5–5.0)
Alkaline Phosphatase: 40 U/L (ref 38–126)
Anion gap: 10 (ref 5–15)
BUN: 19 mg/dL (ref 6–20)
CO2: 22 mmol/L (ref 22–32)
Calcium: 9 mg/dL (ref 8.9–10.3)
Chloride: 104 mmol/L (ref 98–111)
Creatinine, Ser: 0.9 mg/dL (ref 0.61–1.24)
GFR, Estimated: >60 mL/min (ref >60)
Glucose, Bld: 117 mg/dL — ABNORMAL HIGH (ref 70–99)
Potassium: 4.2 mmol/L (ref 3.5–5.1)
Sodium: 136 mmol/L (ref 135–145)
Total Bilirubin: 0.4 mg/dL (ref 0.3–1.2)
Total Protein: 6.8 g/dL (ref 6.5–8.1)

## 2021-09-13 LAB — BRAIN NATRIURETIC PEPTIDE: B Natriuretic Peptide: 62.9 pg/mL (ref 0.0–100.0)

## 2021-09-13 NOTE — Progress Notes (Signed)
PCP: Malva Limes, MD Cardiology: Dr. Mariah Milling HF Cardiology: Dr. Shirlee Latch  51 y.o. with history of pulmonary sarcoidosis, asthma/COPD, and nonischemic cardiomyopathy presents for followup of CHF.  Patient was diagnosed with sarcoidosis after bronchoscopy and presumably a biopsy in [place] in 4/12. He says that he took steroids and MTX x 1 year then was weaned off.  No immunosuppression since that time.  Echo in 3/20 was normal.  He had an echo in 2/22, after his 2nd bout with COVID-19, with EF down to 45-50%.  Given concern for cardiac involvement by sarcoidosis, he had a cardiac MRI in 4/22.  Despite moderately decreased LV and RV systolic function, no delayed enhancement was seen.  He subsequently had a coronary CTA in 6/22 that showed no significant coronary disease.  Patient also has a history of heavy smoking but quit in 2005.  His pulmonologist has said that he has asthma/COPD.    Repeat echo in 9/22 showed EF 45-50%, dilated LV, normal RV, mild MR.   Cardiac PET in 12/22 at [facility] (took a long time to get due to insurance issues) showed abnormal metabolism in the LV lateral wall consistent with mild-moderate inflammation from possible cardiac sarcoidosis, EF 53%; there were extracardiac hypermetabolic areas in the chest suggestive of sarcoidosis; focal FDG uptake distal esophagus, ?esophagitis versus malignancy.   CT chest in 1/23 with stable signs of pulmonary sarcoidosis.   Patient is stable symptomatically. He is short of breath after walking up 2-3 flights of stairs.  He can jog up to a mile with mild dyspnea.  No dyspnea walking on flat ground.  No chest pain.  No orthopnea/PND.  No palpitations or lightheadedness.    Labs (8/22): K 4.2, creatinine 0.86, BNP 80 Labs (1/23): hgb 15.7, K 4.5, creatinine 0.98, BNP 22  PMH: 1. Pulmonary sarcoidosis: Diagnosed in 4/12 in [place], he was on steroids + MTX for about a year.   - CT chest in 6/22 showed chronic interstitial changes consistent  with sarcoidosis.  2. RBBB 3. GERD 4. Nonischemic cardiomyopathy:  - Echo (3/20) with EF 55-60%.  - Echo (2/22) with EF 45-50% - Cardiac MRI (4/22):  LV EF 36% with mild-moderate LV dilation, RV EF 33%, no LGE.  - Coronary CTA (6/22): Calcium score 0, no significant coronary disease noted.  - Echo (9/22): EF 45-50%, dilated LV, normal RV, mild MR.  - Cardiac PET (12/22): Abnormal metabolism in the LV lateral wall consistent with mild-moderate inflammation from possible cardiac sarcoidosis, EF 53%; there were extracardiac hypermetabolic areas in the chest suggestive of sarcoidosis; focal FDG uptake distal esophagus, ?esophagitis versus malignancy.  5. Asthma/COPD: Quit smoking around 2005.  6. COVID-19 in 10/20 and 1/22.  7. GERD  SH: No ETOH, drugs.  Quit smoking in 2005.  Works in [employer].  Lives in [place].   FH: Unsure of cause of father's death.    ROS: All systems reviewed and negative except as per HPI.   Current Outpatient Medications  Medication Sig Dispense Refill   Calcium Citrate 333 MG TABS Take 4 tablets by mouth daily at 12 noon.     empagliflozin (JARDIANCE) 10 MG TABS tablet Take 1 tablet (10 mg total) by mouth daily before breakfast. 90 tablet 3   folic acid (FOLVITE) 1 MG tablet Take 1 tablet (1 mg total) by mouth daily. 30 tablet 6   ibuprofen (ADVIL) 200 MG tablet Take 200 mg by mouth every 6 (six) hours as needed.     methotrexate (RHEUMATREX) 2.5  MG tablet Take 15 mg (6 tabs) weekly every Friday for 2 weeks, then take 17.5 mg (7 tabs) every Friday for 2 weeks, then take 30 mg (8 tabs) every Friday. 42 tablet 1   metoprolol succinate (TOPROL XL) 25 MG 24 hr tablet Take 0.5 tablets (12.5 mg total) by mouth at bedtime. 45 tablet 3   omeprazole (PRILOSEC) 20 MG capsule Take 1 capsule (20 mg total) by mouth daily. 30 capsule 6   PEG-KCl-NaCl-NaSulf-Na Asc-C (PLENVU) 140 g SOLR Manufacturer's coupon Universal coupon code:BIN: G6837245; GROUP: [id number]; PCN: CNRX; ID:  [id number]; PAY NO MORE $50; NO prior authorization 1 each 0   predniSONE (DELTASONE) 10 MG tablet Take 2.5 tablets (25 mg total) by mouth daily with breakfast. 75 tablet 1   sacubitril-valsartan (ENTRESTO) 24-26 MG Take 0.5 tablets by mouth 2 (two) times daily.     sulfamethoxazole-trimethoprim (BACTRIM DS) 800-160 MG tablet Take 1 tablet by mouth 3 (three) times a week. Every Mon, Wed, Fri 15 tablet 4   Tiotropium Bromide Monohydrate (SPIRIVA RESPIMAT) 2.5 MCG/ACT AERS Inhale 2 puffs into the lungs daily. 4 g 0   Vitamin D, Cholecalciferol, 25 MCG (1000 UT) CAPS Take 1 capsule by mouth daily. 60 capsule    No current facility-administered medications for this encounter.   BP 100/60    Pulse 70    Wt 78.8 kg (173 lb 12.8 oz)    SpO2 95%    BMI 28.05 kg/m  General: NAD Neck: No JVD, no thyromegaly or thyroid nodule.  Lungs: Clear to auscultation bilaterally with normal respiratory effort. CV: Nondisplaced PMI.  Heart regular S1/S2, no S3/S4, no murmur.  No peripheral edema.  No carotid bruit.  Normal pedal pulses.  Abdomen: Soft, nontender, no hepatosplenomegaly, no distention.  Skin: Intact without lesions or rashes.  Neurologic: Alert and oriented x 3.  Psych: Normal affect. Extremities: No clubbing or cyanosis.  HEENT: Normal.    Assessment/Plan: 1. Chronic systolic CHF: Nonischemic cardiomyopathy.  Last echo in 2/22 with EF 45-50%, cardiac MRI in 4/22 with LV EF 36% with mild-moderate LV dilation, RV EF 33%, no LGE.  Coronary CTA in 6/22 showed no significant CAD.  Given pulmonary sarcoidosis, cardiac sarcoidosis was a concern.  No delayed enhancement, however, was seen on cMRI.  Viral myocarditis from COVID-19 was also a concern, but again, no LGE was seen on cMRI.  Echo in 9/22 showed EF up to 45-50%.  Cardiac PET in 12/22 showed abnormal metabolism in the LV lateral wall consistent with mild-moderate inflammation from possible cardiac sarcoidosis, EF 53%.  Based on history of pulmonary  sarcoidosis and findings on cardiac PET, I think that he does have cardiac sarcoidosis. He is not volume overloaded on exam, NYHA class II symptoms. Cardiac meds limited by low BP.  - Continue treatment with prednisone and methotrexate according to the [facility] protocol.  He will get Bactrim for PJP prophylaxis and calcium/vitamin D/omeprazole.  We are titrating up MTX (he is at 17.5 mg/week) and titrating down prednisone (he is at 25 mg daily).   - Repeat cardiac PET after we get to goal MTX => will try to arrange for 5/23 at [facility].   - Check LFTs today on MTX.  - Continue Toprol XL 12.5 mg qhs.  - Continue Entresto 1/2 tab 24/26 bid. BMET/BNP today.  - Continue Jardiance 10 mg daily.   2. Pulmonary sarcoidosis: Followed by pulmonary.  PET in 12/22 showed hypermetabolic areas in chest consistent with pulmonary sarcoidosis.  3. Asthma/COPD: No longer smoking.  Followed by pulmonary.  4. Distal esophageal FDG uptake: This could be due to GERD/esophagitis but cannot rule out malignancy.  - He has been seen by GI, will have EGD and colonoscopy.  - Continue omeprazole.   Followup 1 month with HF pharmacist to manage prednisone/MTX taper.  Followup with me in 2 months.   Marca Ancona 09/13/2021

## 2021-09-13 NOTE — Patient Instructions (Signed)
Thank you for your visit today.  There has been no changes to your medications.  Labs done today, your results will be available in MyChart, we will contact you for abnormal readings.  Your provider wants you to have a follow up PET Scan at [facility]. ONCE APPROVED BY YOUR INSURANCE WE WILL GET YOUR APPOINTMENT SCHEDULE AS FAST AS POSSIBLE.  Please follow up with our heart failure pharmacist in 1 month  Your physician recommends that you schedule a follow-up appointment in: 2 months  If you have any questions or concerns before your next appointment please send [place] a message through [employer] or call our office at [phone].    TO LEAVE A MESSAGE FOR THE NURSE SELECT OPTION 2, PLEASE LEAVE A MESSAGE INCLUDING: YOUR NAME DATE OF BIRTH CALL BACK NUMBER REASON FOR CALL**this is important as we prioritize the call backs  YOU WILL RECEIVE A CALL BACK THE SAME DAY AS LONG AS YOU CALL BEFORE 4:00 PM  At the [facility], you and your health needs are our priority. As part of our continuing mission to provide you with exceptional heart care, we have created designated Provider Care Teams. These Care Teams include your primary Cardiologist (physician) and Advanced Practice Providers (APPs- Physician Assistants and Nurse Practitioners) who all work together to provide you with the care you need, when you need it.   You may see any of the following providers on your designated Care Team at your next follow up: Dr Arvilla Meres Dr Carron Curie, NP Robbie Lis, [place] [facility], [place] Karle Plumber, PharmD   Please be sure to bring in all your medications bottles to every appointment.

## 2021-09-14 MED ORDER — SPIRIVA RESPIMAT 2.5 MCG/ACT IN AERS: 2.0000 | INHALATION_SPRAY | Freq: Every day | RESPIRATORY_TRACT | 3 refills | Status: DC

## 2021-09-18 ENCOUNTER — Encounter: Payer: Self-pay | Admitting: Gastroenterology

## 2021-09-21 ENCOUNTER — Encounter (HOSPITAL_COMMUNITY): Payer: Self-pay | Admitting: Cardiology

## 2021-09-21 ENCOUNTER — Telehealth (HOSPITAL_COMMUNITY): Payer: Self-pay | Admitting: *Deleted

## 2021-09-21 NOTE — Telephone Encounter (Signed)
Approval for PET scan given to [facility] to schedule.  ?

## 2021-09-22 ENCOUNTER — Other Ambulatory Visit (HOSPITAL_COMMUNITY): Payer: Self-pay

## 2021-09-22 MED ORDER — METHOTREXATE 2.5 MG PO TABS: ORAL_TABLET | ORAL | 1 refills | Status: DC

## 2021-09-22 MED ORDER — SULFAMETHOXAZOLE-TRIMETHOPRIM 800-160 MG PO TABS: 1.0000 | ORAL_TABLET | ORAL | 4 refills | Status: DC

## 2021-09-25 ENCOUNTER — Encounter: Payer: Self-pay | Admitting: Gastroenterology

## 2021-09-25 ENCOUNTER — Ambulatory Visit (AMBULATORY_SURGERY_CENTER): Payer: BC Managed Care – PPO | Admitting: Gastroenterology

## 2021-09-25 ENCOUNTER — Other Ambulatory Visit: Payer: Self-pay

## 2021-09-25 ENCOUNTER — Encounter: Payer: Self-pay | Admitting: Pulmonary Disease

## 2021-09-25 VITALS — BP 113/71 | HR 70 | Temp 98.2°F | Resp 14 | Ht 66.0 in | Wt 171.0 lb

## 2021-09-25 DIAGNOSIS — R933 Abnormal findings on diagnostic imaging of other parts of digestive tract: Secondary | ICD-10-CM

## 2021-09-25 DIAGNOSIS — K2970 Gastritis, unspecified, without bleeding: Secondary | ICD-10-CM

## 2021-09-25 DIAGNOSIS — Z1211 Encounter for screening for malignant neoplasm of colon: Secondary | ICD-10-CM | POA: Diagnosis not present

## 2021-09-25 DIAGNOSIS — Z1212 Encounter for screening for malignant neoplasm of rectum: Secondary | ICD-10-CM | POA: Diagnosis not present

## 2021-09-25 DIAGNOSIS — K2990 Gastroduodenitis, unspecified, without bleeding: Secondary | ICD-10-CM

## 2021-09-25 MED ORDER — SODIUM CHLORIDE 0.9 % IV SOLN: 500.0000 mL | Freq: Once | INTRAVENOUS | Status: DC

## 2021-09-25 NOTE — Op Note (Signed)
[facility] ?Patient Name: Steve Wang ?Procedure Date: 09/25/2021 1:13 PM ?MRN: [id number] ?Endoscopist: Makendra Vigeant E. Tomasa Rand , MD ?Age: 51 ?Referring MD:  ?Date of Birth: [birth year] ?Gender: Male ?Account #: [id number] ?Procedure:                Colonoscopy ?Indications:              Screening for colorectal malignant neoplasm, This  ?                          is the patient's first colonoscopy ?Medicines:                Monitored Anesthesia Care ?Procedure:                Pre-Anesthesia Assessment: ?                          - Prior to the procedure, a History and Physical  ?                          was performed, and patient medications and  ?                          allergies were reviewed. The patient's tolerance of  ?                          previous anesthesia was also reviewed. The risks  ?                          and benefits of the procedure and the sedation  ?                          options and risks were discussed with the patient.  ?                          All questions were answered, and informed consent  ?                          was obtained. Prior Anticoagulants: The patient has  ?                          taken no previous anticoagulant or antiplatelet  ?                          agents. ASA Grade Assessment: III - A patient with  ?                          severe systemic disease. After reviewing the risks  ?                          and benefits, the patient was deemed in  ?                          satisfactory condition to undergo the procedure. ?  After obtaining informed consent, the colonoscope  ?                          was passed under direct vision. Throughout the  ?                          procedure, the patient's blood pressure, pulse, and  ?                          oxygen saturations were monitored continuously. The  ?                          CF [id number] #[id number] was introduced through the anus  ?                          and advanced to the  the terminal ileum, with  ?                          identification of the appendiceal orifice and IC  ?                          valve. The colonoscopy was performed without  ?                          difficulty. The patient tolerated the procedure  ?                          well. The quality of the bowel preparation was  ?                          excellent. The terminal ileum, ileocecal valve,  ?                          appendiceal orifice, and rectum were photographed. ?Scope In: 1:29:03 PM ?Scope Out: 1:38:01 PM ?Scope Withdrawal Time: 0 hours 6 minutes 54 seconds  ?Total Procedure Duration: 0 hours 8 minutes 58 seconds  ?Findings:                 The perianal and digital rectal examinations were  ?                          normal. Pertinent negatives include normal  ?                          sphincter tone and no palpable rectal lesions. ?                          The colon (entire examined portion) appeared normal. ?                          The terminal ileum appeared normal. ?                          The retroflexed view of the distal rectum and anal  ?  verge was normal and showed no anal or rectal  ?                          abnormalities. ?Complications:            No immediate complications. ?Estimated Blood Loss:     Estimated blood loss: none. ?Impression:               - The entire examined colon is normal. ?                          - The examined portion of the ileum was normal. ?                          - The distal rectum and anal verge are normal on  ?                          retroflexion view. ?                          - No specimens collected. ?Recommendation:           - Patient has a contact number available for  ?                          emergencies. The signs and symptoms of potential  ?                          delayed complications were discussed with the  ?                          patient. Return to normal activities tomorrow.  ?                           Written discharge instructions were provided to the  ?                          patient. ?                          - Resume previous diet. ?                          - Continue present medications. ?                          - Repeat colonoscopy in 10 years for screening  ?                          purposes. ?Evea Sheek E. Tomasa Rand, MD ?09/25/2021 1:45:46 PM ?This report has been signed electronically. ?

## 2021-09-25 NOTE — Patient Instructions (Signed)
Repeat colonoscopy in 10 years for screening purposes.   YOU HAD AN ENDOSCOPIC PROCEDURE TODAY AT THE [facility]:   Refer to the procedure report that was given to you for any specific questions about what was found during the examination.  If the procedure report does not answer your questions, please call your gastroenterologist to clarify.  If you requested that your care partner not be given the details of your procedure findings, then the procedure report has been included in a sealed envelope for you to review at your convenience later.  YOU SHOULD EXPECT: Some feelings of bloating in the abdomen. Passage of more gas than usual.  Walking can help get rid of the air that was put into your GI tract during the procedure and reduce the bloating. If you had a lower endoscopy (such as a colonoscopy or flexible sigmoidoscopy) you may notice spotting of blood in your stool or on the toilet paper. If you underwent a bowel prep for your procedure, you may not have a normal bowel movement for a few days.  Please Note:  You might notice some irritation and congestion in your nose or some drainage.  This is from the oxygen used during your procedure.  There is no need for concern and it should clear up in a day or so.  SYMPTOMS TO REPORT IMMEDIATELY:  Following lower endoscopy (colonoscopy or flexible sigmoidoscopy):  Excessive amounts of blood in the stool  Significant tenderness or worsening of abdominal pains  Swelling of the abdomen that is new, acute  Fever of 100F or higher  Following upper endoscopy (EGD)  Vomiting of blood or coffee ground material  New chest pain or pain under the shoulder blades  Painful or persistently difficult swallowing  New shortness of breath  Fever of 100F or higher  Black, tarry-looking stools  For urgent or emergent issues, a gastroenterologist can be reached at any hour by calling [phone]. Do not use MyChart messaging for urgent concerns.     DIET:  We do recommend a small meal at first, but then you may proceed to your regular diet.  Drink plenty of fluids but you should avoid alcoholic beverages for 24 hours.  ACTIVITY:  You should plan to take it easy for the rest of today and you should NOT DRIVE or use heavy machinery until tomorrow (because of the sedation medicines used during the test).    FOLLOW UP: Our staff will call the number listed on your records 48-72 hours following your procedure to check on you and address any questions or concerns that you may have regarding the information given to you following your procedure. If we do not reach you, we will leave a message.  We will attempt to reach you two times.  During this call, we will ask if you have developed any symptoms of COVID 19. If you develop any symptoms (ie: fever, flu-like symptoms, shortness of breath, cough etc.) before then, please call [phone].  If you test positive for Covid 19 in the 2 weeks post procedure, please call and report this information to us.    If any biopsies were taken you will be contacted by phone or by letter within the next 1-3 weeks.  Please call us at [phone] if you have not heard about the biopsies in 3 weeks.    SIGNATURES/CONFIDENTIALITY: You and/or your care partner have signed paperwork which will be entered into your electronic medical record.  These signatures attest to   the fact that that the information above on your After Visit Summary has been reviewed and is understood.  Full responsibility of the confidentiality of this discharge information lies with you and/or your care-partner. ? ?

## 2021-09-25 NOTE — Progress Notes (Signed)
[facility] History and Physical ? ? ?Primary Care Physician:  Malva Limes, MD ? ? ?Reason for Procedure:   Colon cancer screening, abnormal imaging (PET) ? ?Plan:    EGD, colonoscopy ? ? ? ? ?HPI: Redford Behrle is a 51 y.o. male with sarcoidosis undergoing initial average risk screening colonoscopy as well as EGD to evaluate PET scan showing increased uptake at the GEJ.  He has no family history of colon cancer and no chronic GI symptoms to include heartburn, acid regurgitation, dysphagia, abdominal pain, nausea/vomiting.  ? ? ?Past Medical History:  ?Diagnosis Date  ? COPD (chronic obstructive pulmonary disease) (HCC)   ? Emphysema of lung (HCC)   ? GERD (gastroesophageal reflux disease)   ? HFrEF (heart failure with reduced ejection fraction) (HCC)   ? a. 09/2018 Echo: EF 55-60%; b. 08/2020 Echo: EF 45-50%, grade 2 diastolic dysfunction; c. 10/2020 cMRI: EF 36%, mild to mod dil LV. Mod red RV fxn. No LGE/evidence of sarcoid. No signif valvular dzs  ? NICM (nonischemic cardiomyopathy) (HCC)   ? a. 09/2018 Echo: EF 55-60%; b. 08/2020 Echo: EF 45-50%; c. 10/2020 cMRI: EF 36%, mild to mod dil LV. Mod red RV fxn. No LGE/evidence of sarcoid. No signif valvular dzs; d. 12/2020 Cor CTA: Ca2+= 0. Nl cors.  ? Pulmonary nodule   ? a. 12/2020 CT chest: 1.3cm posterior basal LUL nodule and 47mm LLL nodule - unchanged.  ? RBBB   ? Sarcoidosis   ? a. Dx 10/2020 in [place] - prev on steroids/methotrexate.  ? ? ?Past Surgical History:  ?Procedure Laterality Date  ? BRONCHOSCOPY  11/09/2010  ? [facility] [place]  ? ? ?Prior to Admission medications   ?Medication Sig Start Date End Date Taking? Authorizing Provider  ?Calcium Citrate 333 MG TABS Take 4 tablets by mouth daily at 12 noon. 07/20/21  Yes Laurey Morale, MD  ?empagliflozin (JARDIANCE) 10 MG TABS tablet Take 1 tablet (10 mg total) by mouth daily before breakfast. 02/23/21  Yes Laurey Morale, MD  ?folic acid (FOLVITE) 1 MG tablet Take 1 tablet (1 mg total) by  mouth daily. 07/20/21  Yes Laurey Morale, MD  ?metoprolol succinate (TOPROL XL) 25 MG 24 hr tablet Take 0.5 tablets (12.5 mg total) by mouth at bedtime. 08/17/21  Yes Laurey Morale, MD  ?omeprazole (PRILOSEC) 20 MG capsule Take 1 capsule (20 mg total) by mouth daily. 07/20/21  Yes Laurey Morale, MD  ?predniSONE (DELTASONE) 10 MG tablet Take 2.5 tablets (25 mg total) by mouth daily with breakfast. 08/17/21  Yes Laurey Morale, MD  ?sacubitril-valsartan (ENTRESTO) 24-26 MG Take 0.5 tablets by mouth 2 (two) times daily. 02/14/21  Yes Creig Hines, NP  ?sulfamethoxazole-trimethoprim (BACTRIM DS) 800-160 MG tablet Take 1 tablet by mouth 3 (three) times a week. Every Mon, Wed, Fri 09/22/21  Yes Laurey Morale, MD  ?Tiotropium Bromide Monohydrate (SPIRIVA RESPIMAT) 2.5 MCG/ACT AERS Inhale 2 puffs into the lungs daily. 08/30/21  Yes Salena Saner, MD  ?Tiotropium Bromide Monohydrate (SPIRIVA RESPIMAT) 2.5 MCG/ACT AERS Inhale 2 puffs into the lungs daily. 09/14/21  Yes Salena Saner, MD  ?Vitamin D, Cholecalciferol, 25 MCG (1000 UT) CAPS Take 1 capsule by mouth daily. 07/20/21  Yes Laurey Morale, MD  ?ibuprofen (ADVIL) 200 MG tablet Take 200 mg by mouth every 6 (six) hours as needed.    [provider]  ?methotrexate (RHEUMATREX) 2.5 MG tablet Take 15 mg (6 tabs) weekly every Friday for  2 weeks, then take 17.5 mg (7 tabs) every Friday for 2 weeks, then take 30 mg (8 tabs) every Friday. 09/22/21   Laurey Morale, MD  ? ? ?Current Outpatient Medications  ?Medication Sig Dispense Refill  ? Calcium Citrate 333 MG TABS Take 4 tablets by mouth daily at 12 noon.    ? empagliflozin (JARDIANCE) 10 MG TABS tablet Take 1 tablet (10 mg total) by mouth daily before breakfast. 90 tablet 3  ? folic acid (FOLVITE) 1 MG tablet Take 1 tablet (1 mg total) by mouth daily. 30 tablet 6  ? metoprolol succinate (TOPROL XL) 25 MG 24 hr tablet Take 0.5 tablets (12.5 mg total) by mouth at bedtime. 45 tablet 3  ?  omeprazole (PRILOSEC) 20 MG capsule Take 1 capsule (20 mg total) by mouth daily. 30 capsule 6  ? predniSONE (DELTASONE) 10 MG tablet Take 2.5 tablets (25 mg total) by mouth daily with breakfast. 75 tablet 1  ? sacubitril-valsartan (ENTRESTO) 24-26 MG Take 0.5 tablets by mouth 2 (two) times daily.    ? sulfamethoxazole-trimethoprim (BACTRIM DS) 800-160 MG tablet Take 1 tablet by mouth 3 (three) times a week. Every Mon, Wed, Fri 20 tablet 4  ? Tiotropium Bromide Monohydrate (SPIRIVA RESPIMAT) 2.5 MCG/ACT AERS Inhale 2 puffs into the lungs daily. 4 g 0  ? Tiotropium Bromide Monohydrate (SPIRIVA RESPIMAT) 2.5 MCG/ACT AERS Inhale 2 puffs into the lungs daily. 4 g 3  ? Vitamin D, Cholecalciferol, 25 MCG (1000 UT) CAPS Take 1 capsule by mouth daily. 60 capsule   ? ibuprofen (ADVIL) 200 MG tablet Take 200 mg by mouth every 6 (six) hours as needed.    ? methotrexate (RHEUMATREX) 2.5 MG tablet Take 15 mg (6 tabs) weekly every Friday for 2 weeks, then take 17.5 mg (7 tabs) every Friday for 2 weeks, then take 30 mg (8 tabs) every Friday. 50 tablet 1  ? ?Current Facility-Administered Medications  ?Medication Dose Route Frequency Provider Last Rate Last Admin  ? 0.9 %  sodium chloride infusion  500 mL Intravenous Once Jenel Lucks, MD      ? ? ?Allergies as of 09/25/2021  ? (No Known Allergies)  ? ? ?Family History  ?Problem Relation Age of Onset  ? COPD Mother   ? Colon cancer Neg Hx   ? Pancreatic cancer Neg Hx   ? Liver cancer Neg Hx   ? Esophageal cancer Neg Hx   ? Stomach cancer Neg Hx   ? ? ?Social History  ? ?Socioeconomic History  ? Marital status: Single  ?  Spouse name: Not on file  ? Number of children: 0  ? Years of education: Not on file  ? Highest education level: Not on file  ?Occupational History  ? Occupation: [employer]  ?Tobacco Use  ? Smoking status: Former  ?  Packs/day: 1.00  ?  Years: 25.00  ?  Pack years: 25.00  ?  Types: Cigarettes  ?  Quit date: 07/23/2008  ?  Years since quitting: 13.1  ?  Smokeless tobacco: Former  ?  Types: Chew  ?  Quit date: 07/24/2020  ?Vaping Use  ? Vaping Use: Never used  ?Substance and Sexual Activity  ? Alcohol use: Never  ? Drug use: Not Currently  ? Sexual activity: Not on file  ?Other Topics Concern  ? Not on file  ?Social History Narrative  ? Not on file  ? ?Social Determinants of Health  ? ?Financial Resource Strain: Not on file  ?  Food Insecurity: Not on file  ?Transportation Needs: Not on file  ?Physical Activity: Not on file  ?Stress: Not on file  ?Social Connections: Not on file  ?Intimate Partner Violence: Not on file  ? ? ?Review of Systems: ? ?All other review of systems negative except as mentioned in the HPI. ? ?Physical Exam: ?Vital signs ?BP 114/79   Pulse 80   Temp 98.2 ?F (36.8 ?C)   Ht 5\' 6"  (1.676 m)   Wt 171 lb (77.6 kg)   SpO2 96%   BMI 27.60 kg/m?  ? ?General:   Alert,  Well-developed, well-nourished, pleasant and cooperative in NAD ?Airway:  Mallampati 3 ?Lungs:  Clear throughout to auscultation.   ?Heart:  Regular rate and rhythm; no murmurs, clicks, rubs,  or gallops. ?Abdomen:  Soft, nontender and nondistended. Normal bowel sounds.   ?Neuro/Psych:  Normal mood and affect. A and O x 3 ? ? ?Adley Castello E. Tomasa Rand, MD ?[facility] ? ?

## 2021-09-25 NOTE — Progress Notes (Signed)
VS by DT    

## 2021-09-25 NOTE — Op Note (Signed)
[facility] ?Patient Name: Steve Wang ?Procedure Date: 09/25/2021 1:14 PM ?MRN: [id number] ?Endoscopist: Ewin Rehberg E. Tomasa Rand , MD ?Age: 51 ?Referring MD:  ?Date of Birth: 31-Jul-1970 ?Gender: Male ?Account #: [id number] ?Procedure:                Upper GI endoscopy ?Indications:              Abnormal PET scan of the GI tract ?Medicines:                Monitored Anesthesia Care ?Procedure:                Pre-Anesthesia Assessment: ?                          - Prior to the procedure, a History and Physical  ?                          was performed, and patient medications and  ?                          allergies were reviewed. The patient's tolerance of  ?                          previous anesthesia was also reviewed. The risks  ?                          and benefits of the procedure and the sedation  ?                          options and risks were discussed with the patient.  ?                          All questions were answered, and informed consent  ?                          was obtained. Prior Anticoagulants: The patient has  ?                          taken no previous anticoagulant or antiplatelet  ?                          agents. ASA Grade Assessment: III - A patient with  ?                          severe systemic disease. After reviewing the risks  ?                          and benefits, the patient was deemed in  ?                          satisfactory condition to undergo the procedure. ?                          After obtaining informed consent, the endoscope was  ?  passed under direct vision. Throughout the  ?                          procedure, the patient's blood pressure, pulse, and  ?                          oxygen saturations were monitored continuously. The  ?                          GIF [id number] #[id number] was introduced through the  ?                          mouth, and advanced to the second part of duodenum.  ?                          The upper GI  endoscopy was accomplished without  ?                          difficulty. The patient tolerated the procedure  ?                          well. ?Scope In: ?Scope Out: ?Findings:                 The examined portions of the nasopharynx,  ?                          oropharynx and larynx were normal. ?                          The examined esophagus was normal. ?                          The entire examined stomach was normal. ?                          The examined duodenum was normal. ?Complications:            No immediate complications. ?Estimated Blood Loss:     Estimated blood loss: none. ?Impression:               - The examined portions of the nasopharynx,  ?                          oropharynx and larynx were normal. ?                          - Normal esophagus. ?                          - Normal stomach. ?                          - Normal examined duodenum. ?                          - No specimens collected. ?                          -  Abnormal PET uptake at GEJ likely related to  ?                          physiologic reflux ?Recommendation:           - Patient has a contact number available for  ?                          emergencies. The signs and symptoms of potential  ?                          delayed complications were discussed with the  ?                          patient. Return to normal activities tomorrow.  ?                          Written discharge instructions were provided to the  ?                          patient. ?                          - Resume previous diet. ?                          - Continue present medications. ?                          - No further evaluation recommended. ?Cong Hightower E. Tomasa Rand, MD ?09/25/2021 1:43:05 PM ?This report has been signed electronically. ?

## 2021-09-25 NOTE — Progress Notes (Signed)
Report to PACU, RN, vss, BBS= Clear.  

## 2021-09-26 NOTE — Telephone Encounter (Signed)
I spoke with [facility] on 09/19/21 and she stated that they did have the referral. I have left a message with [address]. Claudette Stapler nurse checking to see what is going on  ?

## 2021-09-26 NOTE — Telephone Encounter (Signed)
Referral was placed to [facility] cardiology on 08/29/21. Patient states he has not been contacted.  ? ?Steve Wang, can you help with this? ?

## 2021-09-27 ENCOUNTER — Encounter (HOSPITAL_COMMUNITY): Payer: Self-pay | Admitting: Cardiology

## 2021-09-27 ENCOUNTER — Encounter (HOSPITAL_COMMUNITY): Payer: Self-pay

## 2021-09-27 ENCOUNTER — Telehealth: Payer: Self-pay | Admitting: *Deleted

## 2021-09-27 NOTE — Telephone Encounter (Signed)
?  Follow up Call- ? ?Call back number 09/25/2021  ?Post procedure Call Back phone  # [phone]  ?Permission to leave phone message Yes  ?Some recent data might be hidden  ?  ? ?Patient questions: ? ?Do you have a fever, pain , or abdominal swelling? No. ?Pain Score  0 * ? ?Have you tolerated food without any problems? Yes.   ? ?Have you been able to return to your normal activities? Yes.   ? ?Do you have any questions about your discharge instructions: ?Diet   No. ?Medications  No. ?Follow up visit  No. ? ?Do you have questions or concerns about your Care? No. ? ?Actions: ?* If pain score is 4 or above: ?No action needed, pain <4. ? ? ?

## 2021-10-02 ENCOUNTER — Telehealth: Payer: Self-pay

## 2021-10-02 NOTE — Telephone Encounter (Signed)
Patient is aware of date/time of covid test.   

## 2021-10-04 ENCOUNTER — Other Ambulatory Visit
Admission: RE | Admit: 2021-10-04 | Discharge: 2021-10-04 | Disposition: A | Discharge status: Home / Self Care | Payer: BC Managed Care – PPO | Source: Ambulatory Visit | Attending: Pulmonary Disease | Admitting: Pulmonary Disease

## 2021-10-04 ENCOUNTER — Other Ambulatory Visit: Payer: Self-pay

## 2021-10-04 DIAGNOSIS — Z01812 Encounter for preprocedural laboratory examination: Secondary | ICD-10-CM | POA: Diagnosis not present

## 2021-10-04 DIAGNOSIS — Z20822 Contact with and (suspected) exposure to covid-19: Secondary | ICD-10-CM | POA: Diagnosis not present

## 2021-10-04 DIAGNOSIS — Z1152 Encounter for screening for COVID-19: Secondary | ICD-10-CM

## 2021-10-04 LAB — SARS CORONAVIRUS 2 (TAT 6-24 HRS): SARS Coronavirus 2: NEGATIVE

## 2021-10-05 ENCOUNTER — Encounter: Payer: Self-pay | Admitting: Pulmonary Disease

## 2021-10-05 ENCOUNTER — Ambulatory Visit: Payer: BC Managed Care – PPO | Attending: Pulmonary Disease

## 2021-10-05 DIAGNOSIS — Z87891 Personal history of nicotine dependence: Secondary | ICD-10-CM | POA: Diagnosis not present

## 2021-10-05 DIAGNOSIS — D860 Sarcoidosis of lung: Secondary | ICD-10-CM | POA: Diagnosis not present

## 2021-10-05 DIAGNOSIS — R0600 Dyspnea, unspecified: Secondary | ICD-10-CM | POA: Diagnosis not present

## 2021-10-05 DIAGNOSIS — J439 Emphysema, unspecified: Secondary | ICD-10-CM | POA: Diagnosis not present

## 2021-10-05 MED ORDER — ALBUTEROL SULFATE (2.5 MG/3ML) 0.083% IN NEBU
2.5000 mg | INHALATION_SOLUTION | Freq: Once | RESPIRATORY_TRACT | Status: AC
  Filled 2021-10-05: qty 3

## 2021-10-10 NOTE — Progress Notes (Incomplete)
***In Progress*** ? ?  ?Advanced Heart Failure Clinic Note  ? ?PCP: Malva Limes, MD ?Cardiology: Dr. Mariah Milling ?HF Cardiology: Dr. Shirlee Latch ? ?HPI:  ?51 y.o. with history of pulmonary sarcoidosis, asthma/COPD, and nonischemic cardiomyopathy presents for followup of CHF.  Patient was diagnosed with sarcoidosis after bronchoscopy and presumably a biopsy in [place] in 10/2010. He says that he took steroids and MTX x 1 year then was weaned off.  No immunosuppression since that time.  Echo in 3/20 was normal.  He had an echo in 08/2020, after his 2nd bout with COVID-19, with EF down to 45-50%. Given concern for cardiac involvement by sarcoidosis, he had a cardiac MRI in 10/2020. Despite moderately decreased LV and RV systolic function, no delayed enhancement was seen.  He subsequently had a coronary CTA in 12/2020 that showed no significant coronary disease.  Patient also has a history of heavy smoking but quit in 2005.  His pulmonologist has said that he has asthma/COPD.   ?Repeat echo in 03/2021 showed EF 45-50%, dilated LV, normal RV, mild MR.  ?  ?Cardiac PET in 06/2021 at [facility] (took a long time to get due to insurance issues) showed abnormal metabolism in the LV lateral wall consistent with mild-moderate inflammation from possible cardiac sarcoidosis, EF 53%; there were extracardiac hypermetabolic areas in the chest suggestive of sarcoidosis; focal FDG uptake distal esophagus, ?esophagitis versus malignancy.  ?  ?CT chest in 07/2021 with stable signs of pulmonary sarcoidosis.  ?  ?Patient was last seen by Dr. Shirlee Latch on 09/13/21 and was stable symptomatically. He reported shortness of breath after walking up 2-3 flights of stairs and could jog up to a mile with mild dyspnea.  He denied dyspnea walking on flat ground.  He had no chest pain, orthopnea/PND, and no palpitations or lightheadedness.   ? ?Today he returns to HF clinic for pharmacist medication titration. At last visit with MD current medications were continued  with planned titrations per the [facility] cardiac sarcoidosis protocol.  ? ?Overall feeling ***. ?Dizziness, lightheadedness, fatigue:  ?Chest pain or palpitations: ? ?How is your breathing?: *** ?SOB:  ?Able to complete all ADLs. Activity level *** ? ?Weight at home pounds. Takes furosemide/torsemide/bumex *** mg *** daily.  ?LEE ?PND/Orthopnea ? ?Appetite *** ?Low-salt diet:  ? ?Physical Exam ?Cost/affordability of meds ? ? ?HF Medications: ?Metoprolol succinate 12.5 mg daily ?Entresto 1/2 tablet of 24/26 mg BID ?Jardiance 10 mg daily ? ?Cardiac Sarcoidosis Medications:  ?Prednisone 20 mg daily (09/15/21 - 10/12/21)  ?Methotrexate 20 mg once weekly (09/15/21 and thereafter) ?Bactrim 1 DS tablet every MWF (when on 15 mg or more of prednisone) ?Folic acid 1 mg daily ?Omeprazole 20 mg daily (while on prednisone) ?Vitamin D 1000 IU daily (while on prednisone) ?Calcium 1332 mg daily (while on prednisone) ? ?Has the patient been experiencing any side effects to the medications prescribed?  {YES NO:22349} ? ?Does the patient have any problems obtaining medications due to transportation or finances?   {YES NO:22349} ? ?Understanding of regimen: {excellent/good/fair/poor:19665} ?Understanding of indications: {excellent/good/fair/poor:19665} ?Potential of compliance: {excellent/good/fair/poor:19665} ?Patient understands to avoid NSAIDs. ?Patient understands to avoid decongestants. ?  ? ?Pertinent Lab Values: ?09/13/21: Serum creatinine 0.90, BUN 19, Potassium 4.2, Sodium 136, BNP 62.9 ?*** CMET pending ? ?Vital Signs: ?Weight: *** (last clinic weight: 173.8 lb) ?Blood pressure: ***  ?Heart rate: ***  ? ?Assessment/Plan: ?1. Chronic systolic CHF: Nonischemic cardiomyopathy.  Last echo in 08/2020 with EF 45-50%, cardiac MRI in 10/2020 with LV EF 36%  with mild-moderate LV dilation, RV EF 33%, no LGE.  Coronary CTA in 12/2020 showed no significant CAD.  Given pulmonary sarcoidosis, cardiac sarcoidosis was a concern.  No delayed  enhancement, however, was seen on cMRI.  Viral myocarditis from COVID-19 was also a concern, but again, no LGE was seen on cMRI.  Echo in 03/2021 showed EF up to 45-50%.  Cardiac PET in 06/2021 showed abnormal metabolism in the LV lateral wall consistent with mild-moderate inflammation from possible cardiac sarcoidosis, EF 53%.  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 He is not volume overloaded on exam, NYHA class II symptoms. Cardiac meds limited by low BP.  ?- Continue treatment with prednisone and methotrexate according to the [facility] protocol.  He will get Bactrim for PJP prophylaxis and calcium/vitamin D/omeprazole.  We are titrating up MTX (he is at 17.5 mg/week) and titrating down prednisone (he is at 25 mg daily).   ?- Repeat cardiac PET after we get to goal MTX => will try to arrange for 11/2021 at [facility] again.   ?- Check LFTs today on MTX.  ?- Continue metoprolol succinate 12.5 mg qhs.  ?- Continue Entresto 1/2 tab 24/26 BID. BMET/BNP today.  ?- Continue Jardiance 10 mg daily.   ?2. Pulmonary sarcoidosis: Followed by pulmonary.  PET in 06/2021 showed hypermetabolic areas in chest consistent with pulmonary sarcoidosis.  ?3. Asthma/COPD: No longer smoking.  Followed by pulmonary.  ?4. Distal esophageal FDG uptake: This could be due to GERD/esophagitis but cannot rule out malignancy.  ?- He has been seen by GI, will have EGD and colonoscopy.  ?- Continue omeprazole.  ? ?Follow up *** ? ? ?Karle Plumber, PharmD, BCPS, BCCP, CPP ?Heart Failure Clinic Pharmacist ?[phone] ? ? ?

## 2021-10-11 ENCOUNTER — Other Ambulatory Visit: Payer: Self-pay

## 2021-10-11 ENCOUNTER — Ambulatory Visit (HOSPITAL_COMMUNITY)
Admission: RE | Admit: 2021-10-11 | Discharge: 2021-10-11 | Disposition: A | Discharge status: Home / Self Care | Payer: BC Managed Care – PPO | Source: Ambulatory Visit | Attending: Cardiology | Admitting: Cardiology

## 2021-10-11 VITALS — BP 122/84 | HR 75 | Wt 181.0 lb

## 2021-10-11 DIAGNOSIS — Z87891 Personal history of nicotine dependence: Secondary | ICD-10-CM | POA: Insufficient documentation

## 2021-10-11 DIAGNOSIS — I5022 Chronic systolic (congestive) heart failure: Secondary | ICD-10-CM | POA: Diagnosis not present

## 2021-10-11 DIAGNOSIS — Z7901 Long term (current) use of anticoagulants: Secondary | ICD-10-CM | POA: Insufficient documentation

## 2021-10-11 DIAGNOSIS — L853 Xerosis cutis: Secondary | ICD-10-CM | POA: Insufficient documentation

## 2021-10-11 DIAGNOSIS — Z09 Encounter for follow-up examination after completed treatment for conditions other than malignant neoplasm: Secondary | ICD-10-CM | POA: Insufficient documentation

## 2021-10-11 DIAGNOSIS — J449 Chronic obstructive pulmonary disease, unspecified: Secondary | ICD-10-CM | POA: Insufficient documentation

## 2021-10-11 DIAGNOSIS — Z79899 Other long term (current) drug therapy: Secondary | ICD-10-CM | POA: Diagnosis not present

## 2021-10-11 DIAGNOSIS — R0602 Shortness of breath: Secondary | ICD-10-CM | POA: Insufficient documentation

## 2021-10-11 DIAGNOSIS — M549 Dorsalgia, unspecified: Secondary | ICD-10-CM | POA: Diagnosis not present

## 2021-10-11 DIAGNOSIS — M729 Fibroblastic disorder, unspecified: Secondary | ICD-10-CM | POA: Insufficient documentation

## 2021-10-11 DIAGNOSIS — Z8616 Personal history of COVID-19: Secondary | ICD-10-CM | POA: Insufficient documentation

## 2021-10-11 DIAGNOSIS — Z7984 Long term (current) use of oral hypoglycemic drugs: Secondary | ICD-10-CM | POA: Diagnosis not present

## 2021-10-11 DIAGNOSIS — K219 Gastro-esophageal reflux disease without esophagitis: Secondary | ICD-10-CM | POA: Diagnosis not present

## 2021-10-11 DIAGNOSIS — I428 Other cardiomyopathies: Secondary | ICD-10-CM | POA: Insufficient documentation

## 2021-10-11 DIAGNOSIS — D860 Sarcoidosis of lung: Secondary | ICD-10-CM | POA: Diagnosis not present

## 2021-10-11 DIAGNOSIS — K209 Esophagitis, unspecified without bleeding: Secondary | ICD-10-CM | POA: Diagnosis not present

## 2021-10-11 LAB — CBC WITH DIFFERENTIAL/PLATELET
Abs Immature Granulocytes: 0.1 10*3/uL — ABNORMAL HIGH (ref 0.00–0.07)
Basophils Absolute: 0.1 10*3/uL (ref 0.0–0.1)
Basophils Relative: 1 %
Eosinophils Absolute: 0.2 10*3/uL (ref 0.0–0.5)
Eosinophils Relative: 2 %
HCT: 47.2 % (ref 39.0–52.0)
Hemoglobin: 16.1 g/dL (ref 13.0–17.0)
Immature Granulocytes: 1 %
Lymphocytes Relative: 23 %
Lymphs Abs: 2.2 10*3/uL (ref 0.7–4.0)
MCH: 32.1 pg (ref 26.0–34.0)
MCHC: 34.1 g/dL (ref 30.0–36.0)
MCV: 94.2 fL (ref 80.0–100.0)
Monocytes Absolute: 0.9 10*3/uL (ref 0.1–1.0)
Monocytes Relative: 9 %
Neutro Abs: 6.1 10*3/uL (ref 1.7–7.7)
Neutrophils Relative %: 64 %
Platelets: 192 10*3/uL (ref 150–400)
RBC: 5.01 MIL/uL (ref 4.22–5.81)
RDW: 13.5 % (ref 11.5–15.5)
WBC: 9.6 10*3/uL (ref 4.0–10.5)
nRBC: 0 % (ref 0.0–0.2)

## 2021-10-11 LAB — COMPREHENSIVE METABOLIC PANEL
ALT: 26 U/L (ref 0–44)
AST: 20 U/L (ref 15–41)
Albumin: 3.8 g/dL (ref 3.5–5.0)
Alkaline Phosphatase: 44 U/L (ref 38–126)
Anion gap: 7 (ref 5–15)
BUN: 18 mg/dL (ref 6–20)
CO2: 26 mmol/L (ref 22–32)
Calcium: 9 mg/dL (ref 8.9–10.3)
Chloride: 108 mmol/L (ref 98–111)
Creatinine, Ser: 0.96 mg/dL (ref 0.61–1.24)
GFR, Estimated: >60 mL/min (ref >60)
Glucose, Bld: 85 mg/dL (ref 70–99)
Potassium: 4 mmol/L (ref 3.5–5.1)
Sodium: 141 mmol/L (ref 135–145)
Total Bilirubin: 0.5 mg/dL (ref 0.3–1.2)
Total Protein: 6.5 g/dL (ref 6.5–8.1)

## 2021-10-11 MED ORDER — METHOTREXATE 2.5 MG PO TABS: ORAL_TABLET | ORAL | 3 refills | Status: DC

## 2021-10-11 MED ORDER — PREDNISONE 5 MG PO TABS: ORAL_TABLET | ORAL | 0 refills | Status: DC

## 2021-10-11 NOTE — Progress Notes (Signed)
?Advanced Heart Failure Clinic Note  ? ?PCP: Malva Limes, MD ?Cardiology: Dr. Mariah Milling ?HF Cardiology: Dr. Shirlee Latch ? ?HPI:  ?51 y.o. with history of pulmonary sarcoidosis, asthma/COPD, and nonischemic cardiomyopathy presents for followup of CHF.  Patient was diagnosed with sarcoidosis after bronchoscopy and presumably a biopsy in [place] in 10/2010. He says that he took steroids and MTX x 1 year then was weaned off.  No immunosuppression since that time.  Echo in 09/2018 was normal.  He had an echo in 08/2020, after his 2nd bout with COVID-19, with EF down to 45-50%. Given concern for cardiac involvement by sarcoidosis, he had a cardiac MRI in 10/2020. Despite moderately decreased LV and RV systolic function, no delayed enhancement was seen.  He subsequently had a coronary CTA in 12/2020 that showed no significant coronary disease.  Patient also has a history of heavy smoking but quit in 2005.  His pulmonologist has said that he has asthma/COPD.  Repeat echo in 03/2021 showed EF 45-50%, dilated LV, normal RV, mild MR.  ? ?Cardiac PET in 06/2021 at [facility] (took a long time to get due to insurance issues) showed abnormal metabolism in the LV lateral wall consistent with mild-moderate inflammation from possible cardiac sarcoidosis, EF 53%; there were extracardiac hypermetabolic areas in the chest suggestive of sarcoidosis; focal FDG uptake distal esophagus, ?esophagitis versus malignancy.  ? ?CT chest in 07/2021 with stable signs of pulmonary sarcoidosis.  ? ?Patient was last seen by Dr. Shirlee Latch on 09/13/21 and was stable symptomatically. He reported shortness of breath after walking up 2-3 flights of stairs and could jog up to a mile with mild dyspnea. He denied dyspnea walking on flat ground. He had no chest pain, orthopnea/PND, and no palpitations or lightheadedness.  ? ?Today he returns to HF clinic for pharmacist medication titration. At last visit with MD current medications were continued with planned titrations per  the [facility] cardiac sarcoidosis protocol. He is taking all medications per protocol. Denies any adverse effects. Endorses feeling slightly more SOB likely due to gaining weight from prednisone. Since decreasing prednisone, he has had his plantar fascitis, back pain, and dry skin return. He had these before taking prednisone but they improved due to the anti-inflammatory effects of prednisone. Denies dizziness, lightheadedness, chest pain, or palpitations. No LEE, PND or orthopnea. Does not check BP at home. He notes that he is establishing with [facility] cardiology and may or may not continue to follow here.  ? ?HF Medications: ?Metoprolol succinate 12.5 mg daily ?Entresto 1/2 tablet of 24/26 mg BID ?Jardiance 10 mg daily ? ?Cardiac Sarcoidosis Medications: See telephone note from 08/17/21 for full protocol.  ?Prednisone 20 mg daily (09/15/21 - 10/12/21)  ?Methotrexate 20 mg once weekly (09/15/21 and thereafter) ?Bactrim 1 DS tablet every MWF (when on 15 mg or more of prednisone) ?Folic acid 1 mg daily ?Omeprazole 20 mg daily  ?Vitamin D 1000 IU daily  ?Calcium 1332 mg daily  ? ?Has the patient been experiencing any side effects to the medications prescribed?  Weight gain with prednisone ? ?Does the patient have any problems obtaining medications due to transportation or finances?   No; [employer].  ? ?Understanding of regimen: good ?Understanding of indications: good ?Potential of compliance: good ?Patient understands to avoid NSAIDs. ?Patient understands to avoid decongestants. ?  ?Pertinent Lab Values: ?10/11/21: Serum creatinine 0.96, BUN 18, Potassium 4.0, Sodium 141, AST 20, ALT 26, Plt 192 ? ?Vital Signs: ?Weight: 181.0 lb (last clinic weight: 173.8 lb) ?Blood  pressure: 122/84  ?Heart rate: 75  ? ?Assessment/Plan: ?1. Chronic systolic CHF: Nonischemic cardiomyopathy.  Last echo in 08/2020 with EF 45-50%, cardiac MRI in 10/2020 with LV EF 36% with mild-moderate LV dilation, RV EF 33%, no LGE.  Coronary  CTA in 12/2020 showed no significant CAD.  Given pulmonary sarcoidosis, cardiac sarcoidosis was a concern.  No delayed enhancement, however, was seen on cMRI.  Viral myocarditis from COVID-19 was also a concern, but again, no LGE was seen on cMRI.  Echo in 03/2021 showed EF up to 45-50%.  Cardiac PET in 06/2021 showed abnormal metabolism in the LV lateral wall consistent with mild-moderate inflammation from possible cardiac sarcoidosis, EF 53%.  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  ?- He is not volume overloaded on exam, NYHA class II symptoms. Cardiac meds limited by low BP.  ?- Continue cardiac sarcoidosis treatment according to the [facility] protocol (full protocol in 08/17/21 telephone note):  ?- Continue prednisone 20 mg daily. Due to decrease to 15 mg daily on 10/13/21.  ?- Continue methotrexate 20 mg once weekly on Fridays (at goal dose). CMET and CBC with diff today are stable/wnl.  ?- Continue Bactrim 1 DS tablet every MWF (when on 15 mg or more of prednisone - will discontinue on 11/10/21, when prednisone decreases to 10 mg daily) ?- Continue folic acid 1 mg daily ?- Continue omeprazole 20 mg daily  ?- Continue vitamin D 1000 IU daily  ?- Continue calcium 1332 mg daily  ?- Continue metoprolol succinate 12.5 mg QHS.  ?- Continue Entresto 1/2 tab 24/26 BID.  ?- Continue Jardiance 10 mg daily.   ?- Repeat cardiac PET once at goal MTX => he is scheduled for PET 10/19/21 at [facility].   ?2. Pulmonary sarcoidosis: Followed by pulmonary.  PET in 06/2021 showed hypermetabolic areas in chest consistent with pulmonary sarcoidosis.  ?3. Asthma/COPD: No longer smoking.  Followed by pulmonary.  ?4. Distal esophageal FDG uptake: This could be due to GERD/esophagitis but cannot rule out malignancy.  ?- He has been seen by GI, will have EGD and colonoscopy.  ?- Continue omeprazole.  ? ?Follow up in May with Dr. Shirlee Latch.  ? ?Karle Plumber, PharmD,  BCPS, BCCP, CPP ?Heart Failure Clinic Pharmacist ?[phone] ? ?

## 2021-10-11 NOTE — Patient Instructions (Signed)
It was a pleasure seeing you today! ? ?MEDICATIONS: ?-We are changing your medications today ?-Follow protocol below ?-Call if you have questions about your medications. ? ?Cardiac Sarcoidosis Treatment Protocol ?Start date 07/21/21 ?  ?Prednisone  ?07/21/21 - 08/17/21: prednisone 30 mg daily  ?08/18/21 - 09/14/21 : prednisone 25 mg daily  ?09/15/21 - 10/12/21: prednisone 20 mg daily  ?10/13/21 - 11/09/21: prednisone 15 mg daily ?11/10/21 - 11/23/21: prednisone 10 mg per day; stop Bactrim ?11/24/21 - 12/07/21: prednisone 7.5 mg daily  ?12/08/21 - 12/21/21: prednisone 5 mg daily ?12/22/21 - 01/04/22: prednisone 2.5 mg daily  ?01/05/22: stop prednisone  ?  ?Methotrexate ?07/21/21 - 07/28/21: methotrexate 10 mg (four 2.5 mg tablets) once weekly ?08/04/21 - 08/11/21: methotrexate 12.5 mg (five 2.5 mg tablets) once weekly ?08/18/21 - 08/25/21: methotrexate 15 mg (six 2.5 mg tablets) once weekly  ?09/01/21 - 09/08/21: methotrexate 17.5 mg (seven 2.5 mg tablets) once weekly ?09/15/21 and thereafter: methotrexate 20 mg (eight 2.5 mg tablets ) once weekly ? ?LABS: ?-We will call you if your labs need attention. ? ?NEXT APPOINTMENT: ?Return to clinic on 11/24/21 with Dr. Shirlee Latch. ? ?In general, to take care of your heart failure: ?-Limit your fluid intake to 2 Liters (half-gallon) per day.   ?-Limit your salt intake to ideally 2-3 grams (2000-3000 mg) per day. ?-Weigh yourself daily and record, and bring that "weight diary" to your next appointment.  (Weight gain of 2-3 pounds in 1 day typically means fluid weight.) ?-The medications for your heart are to help your heart and help you live longer.   ?-Please contact us before stopping any of your heart medications. ? ?Call the clinic at [phone] with questions or to reschedule future appointments. ? ?

## 2021-10-12 ENCOUNTER — Other Ambulatory Visit (HOSPITAL_COMMUNITY): Payer: Self-pay | Admitting: Cardiology

## 2021-10-19 DIAGNOSIS — D860 Sarcoidosis of lung: Secondary | ICD-10-CM | POA: Diagnosis not present

## 2021-10-19 DIAGNOSIS — D8685 Sarcoid myocarditis: Secondary | ICD-10-CM | POA: Diagnosis not present

## 2021-10-24 ENCOUNTER — Encounter (HOSPITAL_COMMUNITY): Payer: Self-pay

## 2021-10-27 ENCOUNTER — Other Ambulatory Visit (HOSPITAL_COMMUNITY): Payer: Self-pay

## 2021-10-27 ENCOUNTER — Encounter: Payer: Self-pay | Admitting: Pulmonary Disease

## 2021-10-27 MED ORDER — METHOTREXATE 2.5 MG PO TABS: ORAL_TABLET | ORAL | 3 refills | Status: DC

## 2021-10-27 NOTE — Telephone Encounter (Signed)
Dr. Gonzalez, please advise. Thanks 

## 2021-10-29 ENCOUNTER — Encounter: Payer: Self-pay | Admitting: Pulmonary Disease

## 2021-10-30 ENCOUNTER — Other Ambulatory Visit (HOSPITAL_COMMUNITY): Payer: Self-pay | Admitting: *Deleted

## 2021-10-30 ENCOUNTER — Encounter (HOSPITAL_COMMUNITY): Payer: Self-pay | Admitting: Cardiology

## 2021-10-30 DIAGNOSIS — D860 Sarcoidosis of lung: Secondary | ICD-10-CM

## 2021-10-30 DIAGNOSIS — I428 Other cardiomyopathies: Secondary | ICD-10-CM

## 2021-10-31 ENCOUNTER — Ambulatory Visit (HOSPITAL_COMMUNITY)
Admission: RE | Admit: 2021-10-31 | Discharge: 2021-10-31 | Disposition: A | Discharge status: Home / Self Care | Payer: BC Managed Care – PPO | Source: Ambulatory Visit | Attending: Cardiology | Admitting: Cardiology

## 2021-10-31 ENCOUNTER — Other Ambulatory Visit (HOSPITAL_COMMUNITY): Payer: Self-pay | Admitting: Cardiology

## 2021-10-31 ENCOUNTER — Ambulatory Visit (HOSPITAL_COMMUNITY)
Admission: RE | Admit: 2021-10-31 | Discharge: 2021-10-31 | Disposition: A | Discharge status: Home / Self Care | Payer: BC Managed Care – PPO | Source: Ambulatory Visit | Attending: Internal Medicine | Admitting: Internal Medicine

## 2021-10-31 ENCOUNTER — Encounter (HOSPITAL_COMMUNITY): Payer: Self-pay | Admitting: Cardiology

## 2021-10-31 DIAGNOSIS — D860 Sarcoidosis of lung: Secondary | ICD-10-CM | POA: Insufficient documentation

## 2021-10-31 DIAGNOSIS — I428 Other cardiomyopathies: Secondary | ICD-10-CM | POA: Insufficient documentation

## 2021-10-31 LAB — BRAIN NATRIURETIC PEPTIDE: B Natriuretic Peptide: 35.2 pg/mL (ref 0.0–100.0)

## 2021-10-31 MED ORDER — PREDNISONE 10 MG PO TABS: ORAL_TABLET | ORAL | 3 refills | Status: DC

## 2021-10-31 NOTE — Telephone Encounter (Signed)
I have reviewed the report from the scan at [facility].  There is no evidence of active sarcoid anywhere.  His area of inflammation noted in the heart previously has resolved.  There is no evidence of sarcoidosis anywhere else. ?

## 2021-11-02 ENCOUNTER — Encounter (HOSPITAL_COMMUNITY): Payer: Self-pay | Admitting: Cardiology

## 2021-11-06 ENCOUNTER — Ambulatory Visit (INDEPENDENT_AMBULATORY_CARE_PROVIDER_SITE_OTHER): Payer: BC Managed Care – PPO | Admitting: Pulmonary Disease

## 2021-11-06 ENCOUNTER — Encounter: Payer: Self-pay | Admitting: Pulmonary Disease

## 2021-11-06 VITALS — BP 122/80 | HR 69 | Temp 97.8°F | Ht 66.0 in | Wt 172.8 lb

## 2021-11-06 DIAGNOSIS — R0602 Shortness of breath: Secondary | ICD-10-CM

## 2021-11-06 DIAGNOSIS — D8685 Sarcoid myocarditis: Secondary | ICD-10-CM

## 2021-11-06 DIAGNOSIS — Z87891 Personal history of nicotine dependence: Secondary | ICD-10-CM

## 2021-11-06 DIAGNOSIS — D860 Sarcoidosis of lung: Secondary | ICD-10-CM | POA: Diagnosis not present

## 2021-11-06 DIAGNOSIS — J449 Chronic obstructive pulmonary disease, unspecified: Secondary | ICD-10-CM | POA: Diagnosis not present

## 2021-11-06 MED ORDER — STIOLTO RESPIMAT 2.5-2.5 MCG/ACT IN AERS: 2.0000 | INHALATION_SPRAY | Freq: Every day | RESPIRATORY_TRACT | 0 refills | Status: DC

## 2021-11-06 NOTE — Telephone Encounter (Signed)
Pt seen in office 11/06/2021. This matter was discussed during visit.  ?

## 2021-11-06 NOTE — Progress Notes (Signed)
Subjective:    Patient ID: Steve Wang, male    DOB: 1971/07/03, 51 y.o.   MRN: [id number] Patient Care Team: Malva Limes, MD as PCP - General (Family Medicine) Antonieta Iba, MD as PCP - Cardiology (Cardiology)  Chief Complaint  Patient presents with   Follow-up    Increased sob with exertion, occ dry cough and wheezing.    Requesting MD/Service: Self, primary care physician [name], MD Date of initial consultation: 11 August 2020 Reason for consultation: History of sarcoidosis, GERD issues   PT PROFILE: 51 year old former smoker with biopsy-proven sarcoidosis previously diagnosed to managed at [facility] [place].  Patient now with dyspnea on exertion.   DATA: 10/13/2010 CT chest: Report only, performed at [facility] [place]: Upper lung predominant parenchymal changes, scarring and traction bronchiectasis with small peribronchial and perifissural nodules.  Enlarged bilateral hilar and mediastinal lymph nodes, findings all suggestive of sarcoidosis. 11/03/2010: ACE level 98, eosinophils elevated 8.1% 11/09/2010 Bronchoscopy specimens: [facility] [place], noncaseating granulomas right upper lobe, fungal culture negative for nocardia, Doratomyces species isolated, contaminant.  BAL 63% histiocytes, 33% lymphocytes, 2% neutrophils, 2% eosinophils.  Flow cytometry favored sarcoidosis. 09/21/2013 spirometry: Performed at [facility], FEV1 2.72 L or 76% predicted, FVC 4.42 L or 95% predicted, FEV1/FVC 61%, DLCO moderately reduced.  Consistent with mild obstructive defect (not congruent with sarcoidosis). 09/22/2018 2D echo: Performed in [facility], [place], LVEF 55 to 60% mild LV dilatation, no wall motion abnormalities, otherwise normal. 08/11/2020 angiotensin-converting enzyme: 61 (reference range 14 to 82 units/L) 08/30/2020 CT chest with contrast: Perihilar traction bronchiectasis, parenchymal retraction and architectural distortion consistent with  history of sarcoid.  Left ventricular dilatation.  3 mm left lower lobe nodule. 08/30/2020 echocardiogram: LVEF 45 to 50%, LV with mildly decreased function, global hypokinesis, LV cavity size severely dilated, grade II diastolic dysfunction. 10/24/2020 cardiac morphology MR: Mild to moderate dilated left ventricle, normal LV thickness, global hypokinesis, no regional wall motion abnormalities.  Reduced RV and LV systolic function LVEF 36%.  No evidence for infiltrative disease.  Negative for cardiac sarcoidosis. 12/15/2020 CT coronary morphology: Coronary calcium score of 0, no evidence of CAD.  Lung windows findings as previous. 07/06/2021 myocardial PET/CT [facility]): Abnormal perfusion of the myocardium, normal wall motion and normal thickening.  No coronary artery calcifications, evidence of extracardiac areas in the chest of hypermetabolism suggest sarcoidosis.  Focal increased FDG uptake in the distal esophagus query esophagitis. 08/14/2021 CT chest with contrast: Stable exam from prior, no progressive findings, architectural distortion and scarring bilaterally compatible with prior history of sarcoidosis.  Stable 3 mm left lower lobe pulmonary nodule.  Pulmonary emphysema. 10/05/2021 PFTs: FEV1 2.21 L or 64% predicted, FVC 3.86 L or 87% predicted, FEV1/FVC 57%, lung volumes normal, there is no response to bronchodilators.  Diffusion capacity normal.  Consistent with obstructive lung disease of moderate degree without bronchodilator response.  Given normal lung volumes without evidence of air trapping or hyperinflation query mild concomitant restrictive component. 10/19/2021 PET CT myocardial [facility]): Normal myocardial perfusion, normal left ventricular systolic function, wall motion and thickening, dilated left ventricle.  No areas of extracardiac hypermetabolic activity.   INTERVAL: Patient first evaluated here on 11 August 2020.  Last seen here on 29 August 2021 at that time provided with samples of  Breztri, did not tolerate due to mouth irritation and tachypalpitations.  He is currently on methotrexate and prednisone for his cardiac sarcoid.  He is also on cardiac medications.   HPI Steve Wang presents for follow-up.  Recall that we initially saw him in January 2022 as he wanted to establish with pulmonary due to having had a diagnosis of sarcoidosis.  He was initially diagnosed in the at the [facility] [place].  He had been off of sarcoid treatment and appeared to have been in remission.  He complained of dyspnea out of proportion to the findings on CT of the chest and physical examination and therefore echocardiogram was ordered.  This showed significant changes from prior with decreased LVEF, LV cavity size severely dilated and grade 2 diastolic dysfunction.  He was referred to cardiology with the suspicion of cardiac sarcoid.  This has been confirmed by recent myocardial PET CT performed at [facility] in December 2022.  Recent cardiac PET/[facility] at [facility] on 19 October 2021 showed that the prior uptake on the myocardium had improved and there was no extracardiac activity.  The patient is currently on methotrexate and prednisone.  He continues to complain of shortness of breath.  He also complains of weight gain on prednisone.  Discussed ideal carbohydrate/protein ratio while on prednisone to avoid weight gain.  He is currently not on any inhalers, his trial of Steve Wang was not successful due to mouth irritation and tachypalpitations.  He has not had any fevers, chills or sweats.  Occasional dry cough.  No hemoptysis.  Occasional wheezing noted.  He does not endorse any other complaints.   Review of Systems A 10 point review of systems was performed and it is as noted above otherwise negative.  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   Stage 2 moderate COPD by GOLD classification (HCC) 11/06/2021   Former smoker 11/06/2021   Shortness of breath 11/06/2021   Right  bundle branch block 09/10/2018   GERD (gastroesophageal reflux disease) 06/25/2018   Sarcoidosis of lung (HCC) 06/25/2018   Chronic right shoulder pain 06/25/2018   History of migraine 06/25/2018   History of kidney stones 06/25/2018   Social History   Tobacco Use   Smoking status: Former    Packs/day: 1.00    Years: 25.00    Pack years: 25.00    Types: Cigarettes    Quit date: 07/23/2008    Years since quitting: 13.2   Smokeless tobacco: Former    Types: Chew    Quit date: 07/24/2020  Substance Use Topics   Alcohol use: Never   No Known Allergies  Current Meds  Medication Sig   Calcium Citrate 333 MG TABS Take 4 tablets by mouth daily at 12 noon.   empagliflozin (JARDIANCE) 10 MG TABS tablet Take 1 tablet (10 mg total) by mouth daily before breakfast.   folic acid (FOLVITE) 1 MG tablet Take 1 tablet (1 mg total) by mouth daily.   methotrexate (RHEUMATREX) 2.5 MG tablet Take methotrexate 20 mg (8 tablets) every Friday.   metoprolol succinate (TOPROL XL) 25 MG 24 hr tablet Take 0.5 tablets (12.5 mg total) by mouth at bedtime.   omeprazole (PRILOSEC) 20 MG capsule Take 1 capsule (20 mg total) by mouth daily.   predniSONE (DELTASONE) 10 MG tablet Take 15 mg (1 1/2  tablets) daily or as directed by HF clinic.   sacubitril-valsartan (ENTRESTO) 24-26 MG Take 0.5 tablets by mouth 2 (two) times daily.   sulfamethoxazole-trimethoprim (BACTRIM DS) 800-160 MG tablet Take 1 tablet by mouth 3 (three) times a week. Every Mon, Wed, Fri   Tiotropium Bromide Monohydrate (SPIRIVA RESPIMAT) 2.5 MCG/ACT AERS Inhale 2 puffs into the lungs daily.  Vitamin D, Cholecalciferol, 25 MCG (1000 UT) CAPS Take 1 capsule by mouth daily.   Immunization History  Administered Date(s) Administered   Influenza,inj,Quad PF,6+ Mos 09/08/2012   Influenza-Unspecified 05/07/2011   Pneumococcal Polysaccharide-23 06/25/2018   Tdap 03/24/2013, 06/25/2018        Objective:   Physical Exam BP 122/80 (BP Location:  Left Arm, Cuff Size: Normal)   Pulse 69   Temp 97.8 F (36.6 C) (Temporal)   Ht 5\' 6"  (1.676 m)   Wt 172 lb 12.8 oz (78.4 kg)   SpO2 99%   BMI 27.89 kg/m   GENERAL: Well-developed, well-nourished gentleman, no acute distress, fully ambulatory, no conversational dyspnea. HEAD: Normocephalic, atraumatic.  EYES: Pupils equal, round, reactive to light.  No scleral icterus.  MOUTH: Dentition intact, oral mucosa moist. NECK: Supple. No thyromegaly. Trachea midline. No JVD.  No adenopathy. PULMONARY: Good air entry bilaterally.  Somewhat coarse breath sounds with end expiratory wheezes otherwise, no adventitious sounds.  CARDIOVASCULAR: S1 and S2. Regular rate and rhythm.  No rubs, murmurs or gallops heard. ABDOMEN: Benign. MUSCULOSKELETAL: No joint deformity, no clubbing, no edema.  NEUROLOGIC: No overt focal deficit, no gait disturbance noted.  Speech is fluent. SKIN: Intact,warm,dry.  On limited exam no rashes. PSYCH: Mood and behavior normal.     Assessment & Plan:     ICD-10-CM   1. Sarcoidosis of lung (HCC)  D86.0    Pulmonary involvement quiescent on current therapy Patient does have cardiac sarcoid On methotrexate/prednisone for cardiac sarcoid    2. Cardiac sarcoidosis  D86.85    Follows with [facility] cardiology Follows with [facility] cardiology as well On methotrexate/prednisone    3. Shortness of breath  R06.02    Multifactorial COPD/cardiac sarcoid Deconditioning?    4. Stage 2 moderate COPD by GOLD classification (HCC)  J44.9    Switch Spiriva to Stiolto 2 puffs daily Patient intolerant of ICS (hoarseness/thrush) Continue as needed albuterol    5. Former smoker  Z87.891    No evidence of relapse     Meds ordered this encounter  Medications   Tiotropium Bromide-Olodaterol (STIOLTO RESPIMAT) 2.5-2.5 MCG/ACT AERS    Sig: Inhale 2 puffs into the lungs daily.    Dispense:  4 g    Refill:  0    Order Specific Question:   Lot Number?    Answer:   [id number]    Order  Specific Question:   Expiration Date?    Answer:   12/22/2023    Order Specific Question:   Quantity    Answer:   2   We will see the patient in follow-up in 3 months time call sooner should any new problems arise.  Gailen Shelter, MD Advanced Bronchoscopy PCCM [facility]    *This note was dictated using voice recognition software/Dragon.  Despite best efforts to proofread, errors can occur which can change the meaning. Any transcriptional errors that result from this process are unintentional and may not be fully corrected at the time of dictation.

## 2021-11-06 NOTE — Patient Instructions (Signed)
We are giving you a trial of Stiolto which is a medication that has Spiriva plus an extra ingredient.  Let us know if you have any difficulties with it.  It should not be as strong as the Breztri that you tried previously.  Hopefully this will help with your breathing.  Do not take the Spiriva while on the Stiolto.  Let us know how you do with the Stiolto. ? ?While on prednisone follow a more ketogenic diet this will help with reducing weight gain with the prednisone. ? ?Talk to your primary physician or your cardiologist about potential for cholesterol-lowering medication particularly in view of the artery calcifications noted. ? ?Your levels of Vitamin D to be monitored.  Your primary care physician can do this.  I would recommend a supplement of vitamin D plus K2 these 2 are complementary you can get these types of supplements online 1 good supplement is the [employer] that has vitamin D plus the K2 and omega-3's. ? ?Good resistance bands for workout are a brand called [employer] ([email or web address]://www.undersunfitness.com).  These give you a good workout without joint stress. ? ?We will see him in follow-up in 3 months time call sooner should any new problems arise. ? ? ? ? ?

## 2021-11-13 ENCOUNTER — Encounter (HOSPITAL_COMMUNITY): Payer: Self-pay | Admitting: Cardiology

## 2021-11-13 ENCOUNTER — Other Ambulatory Visit (HOSPITAL_COMMUNITY): Payer: Self-pay | Admitting: *Deleted

## 2021-11-13 DIAGNOSIS — D860 Sarcoidosis of lung: Secondary | ICD-10-CM

## 2021-11-14 ENCOUNTER — Other Ambulatory Visit (HOSPITAL_COMMUNITY): Payer: BC Managed Care – PPO

## 2021-11-21 ENCOUNTER — Encounter (HOSPITAL_COMMUNITY): Payer: Self-pay | Admitting: Cardiology

## 2021-11-21 ENCOUNTER — Ambulatory Visit (HOSPITAL_COMMUNITY)
Admission: RE | Admit: 2021-11-21 | Discharge: 2021-11-21 | Disposition: A | Discharge status: Home / Self Care | Payer: BC Managed Care – PPO | Source: Ambulatory Visit | Attending: Cardiology | Admitting: Cardiology

## 2021-11-21 VITALS — BP 138/88 | HR 81 | Wt 179.6 lb

## 2021-11-21 DIAGNOSIS — Z79899 Other long term (current) drug therapy: Secondary | ICD-10-CM | POA: Insufficient documentation

## 2021-11-21 DIAGNOSIS — I5022 Chronic systolic (congestive) heart failure: Secondary | ICD-10-CM | POA: Diagnosis not present

## 2021-11-21 DIAGNOSIS — I428 Other cardiomyopathies: Secondary | ICD-10-CM | POA: Diagnosis not present

## 2021-11-21 DIAGNOSIS — D860 Sarcoidosis of lung: Secondary | ICD-10-CM | POA: Diagnosis not present

## 2021-11-21 DIAGNOSIS — Z87891 Personal history of nicotine dependence: Secondary | ICD-10-CM | POA: Diagnosis not present

## 2021-11-21 DIAGNOSIS — D8685 Sarcoid myocarditis: Secondary | ICD-10-CM

## 2021-11-21 DIAGNOSIS — Z8616 Personal history of COVID-19: Secondary | ICD-10-CM | POA: Insufficient documentation

## 2021-11-21 DIAGNOSIS — J449 Chronic obstructive pulmonary disease, unspecified: Secondary | ICD-10-CM | POA: Diagnosis not present

## 2021-11-21 LAB — COMPREHENSIVE METABOLIC PANEL
ALT: 24 U/L (ref 0–44)
AST: 18 U/L (ref 15–41)
Albumin: 4.1 g/dL (ref 3.5–5.0)
Alkaline Phosphatase: 43 U/L (ref 38–126)
Anion gap: 9 (ref 5–15)
BUN: 17 mg/dL (ref 6–20)
CO2: 24 mmol/L (ref 22–32)
Calcium: 9.3 mg/dL (ref 8.9–10.3)
Chloride: 107 mmol/L (ref 98–111)
Creatinine, Ser: 0.89 mg/dL (ref 0.61–1.24)
GFR, Estimated: >60 mL/min (ref >60)
Glucose, Bld: 129 mg/dL — ABNORMAL HIGH (ref 70–99)
Potassium: 4.1 mmol/L (ref 3.5–5.1)
Sodium: 140 mmol/L (ref 135–145)
Total Bilirubin: 0.4 mg/dL (ref 0.3–1.2)
Total Protein: 7 g/dL (ref 6.5–8.1)

## 2021-11-21 NOTE — Patient Instructions (Signed)
There has been no changes to your medications. ? ?Labs done today, your results will be available in MyChart, we will contact you for abnormal readings. ? ?Repeat blood work in a month at  [facility]  ? ?Your physician recommends that you schedule a follow-up appointment in: 3 months ? ?If you have any questions or concerns before your next appointment please send [place] a message through [place] or call our office at [phone].   ? ?TO LEAVE A MESSAGE FOR THE NURSE SELECT OPTION 2, PLEASE LEAVE A MESSAGE INCLUDING: ?YOUR NAME ?DATE OF BIRTH ?CALL BACK NUMBER ?REASON FOR CALL**this is important as we prioritize the call backs ? ?YOU WILL RECEIVE A CALL BACK THE SAME DAY AS LONG AS YOU CALL BEFORE 4:00 PM ? ?At the [facility], you and your health needs are our priority. As part of our continuing mission to provide you with exceptional heart care, we have created designated Provider Care Teams. These Care Teams include your primary Cardiologist (physician) and Advanced Practice Providers (APPs- Physician Assistants and Nurse Practitioners) who all work together to provide you with the care you need, when you need it.  ? ?You may see any of the following providers on your designated Care Team at your next follow up: ?Dr Arvilla Meres ?Dr Marca Ancona ?Tonye Becket, NP ?Robbie Lis, PA ?Jessica Milford,NP ?Anna Genre, PA ?Karle Plumber, PharmD ? ? ?Please be sure to bring in all your medications bottles to every appointment.  ? ? ?

## 2021-11-21 NOTE — Progress Notes (Signed)
PCP: Malva Limes, MD ?Cardiology: Dr. Mariah Milling ?HF Cardiology: Dr. Shirlee Latch ? ?51 y.o. with history of pulmonary sarcoidosis, asthma/COPD, and nonischemic cardiomyopathy presents for followup of CHF.  Patient was diagnosed with sarcoidosis after bronchoscopy and presumably a biopsy in [place] in 4/12. He says that he took steroids and MTX x 1 year then was weaned off.  No immunosuppression since that time.  Echo in 3/20 was normal.  He had an echo in 2/22, after his 2nd bout with COVID-19, with EF down to 45-50%.  Given concern for cardiac involvement by sarcoidosis, he had a cardiac MRI in 4/22.  Despite moderately decreased LV and RV systolic function, no delayed enhancement was seen.  He subsequently had a coronary CTA in 6/22 that showed no significant coronary disease.  Patient also has a history of heavy smoking but quit in 2005.  His pulmonologist has said that he has asthma/COPD.   ? ?Repeat echo in 9/22 showed EF 45-50%, dilated LV, normal RV, mild MR.  ? ?Cardiac PET in 12/22 at [facility] (took a long time to get due to insurance issues) showed abnormal metabolism in the LV lateral wall consistent with mild-moderate inflammation from possible cardiac sarcoidosis, EF 53%; there were extracardiac hypermetabolic areas in the chest suggestive of sarcoidosis; focal FDG uptake distal esophagus, ?esophagitis versus malignancy.  ? ?CT chest in 1/23 with stable signs of pulmonary sarcoidosis.  ? ?Repeat cardiac PET at [facility] on 10/19/21 showed EF 50%, no abnormal metabolism (improved).   ? ?Patient returns for followup of cardiac sarcoidosis.  He is up to 20 mg weekly of MTX and is down to 10 mg daily of prednisone.  He was recently started on Stiolto by pulmonary which has helped his breathing.  He is short of breath after climbing 2 flights of stairs or running.  No dyspnea walking on flat ground.  No orthopnea/PND.  No lightheadedness/syncope.  No palpitations.  Weight up 6 lbs. He is getting married in August.     ? ?Labs (8/22): K 4.2, creatinine 0.86, BNP 80 ?Labs (1/23): hgb 15.7, K 4.5, creatinine 0.98, BNP 22 ?Labs (3/23): LFTs normal, K 4, creatinine 0.96 ?Labs (4/23): BNP 35 ? ?PMH: ?1. Pulmonary sarcoidosis: Diagnosed in 4/12 in [place], he was on steroids + MTX for about a year.   ?- CT chest in 6/22 showed chronic interstitial changes consistent with sarcoidosis.  ?2. RBBB ?3. GERD ?4. Nonischemic cardiomyopathy:  ?- Echo (3/20) with EF 55-60%.  ?- Echo (2/22) with EF 45-50% ?- Cardiac MRI (4/22):  LV EF 36% with mild-moderate LV dilation, RV EF 33%, no LGE.  ?- Coronary CTA (6/22): Calcium score 0, no significant coronary disease noted.  ?- Echo (9/22): EF 45-50%, dilated LV, normal RV, mild MR.  ?- Cardiac PET (12/22): Abnormal metabolism in the LV lateral wall consistent with mild-moderate inflammation from possible cardiac sarcoidosis, EF 53%; there were extracardiac hypermetabolic areas in the chest suggestive of sarcoidosis; focal FDG uptake distal esophagus, ?esophagitis versus malignancy.  ?- Cardiac PET (10/19/21): EF 50%, no abnormal metabolism (improved).  ?5. Asthma/COPD: Quit smoking around 2005.  ?6. COVID-19 in 10/20 and 1/22.  ?7. GERD ? ?SH: No ETOH, drugs.  Quit smoking in 2005.  Works in [employer].  Lives in [place].  ? ?FH: Unsure of cause of father's death.   ? ?ROS: All systems reviewed and negative except as per HPI.  ? ?Current Outpatient Medications  ?Medication Sig Dispense Refill  ? Calcium Citrate 333 MG TABS Take 4  tablets by mouth daily at 12 noon.    ? empagliflozin (JARDIANCE) 10 MG TABS tablet Take 1 tablet (10 mg total) by mouth daily before breakfast. 90 tablet 3  ? folic acid (FOLVITE) 1 MG tablet Take 1 tablet (1 mg total) by mouth daily. 30 tablet 6  ? methotrexate (RHEUMATREX) 2.5 MG tablet Take methotrexate 20 mg (8 tablets) every Friday. 32 tablet 3  ? metoprolol succinate (TOPROL XL) 25 MG 24 hr tablet Take 0.5 tablets (12.5 mg total) by mouth at bedtime. 45 tablet 3  ?  predniSONE (DELTASONE) 10 MG tablet Take 15 mg (1 1/2  tablets) daily or as directed by HF clinic. 90 tablet 3  ? sacubitril-valsartan (ENTRESTO) 24-26 MG Take 0.5 tablets by mouth 2 (two) times daily.    ? sulfamethoxazole-trimethoprim (BACTRIM DS) 800-160 MG tablet Take 1 tablet by mouth 3 (three) times a week. Every Mon, Wed, Fri 20 tablet 4  ? Tiotropium Bromide-Olodaterol (STIOLTO RESPIMAT) 2.5-2.5 MCG/ACT AERS Inhale 2 puffs into the lungs daily. 4 g 0  ? Vitamin D, Cholecalciferol, 25 MCG (1000 UT) CAPS Take 1 capsule by mouth daily. 60 capsule   ? ?No current facility-administered medications for this encounter.  ? ?Facility-Administered Medications Ordered in Other Encounters  ?Medication Dose Route Frequency Provider Last Rate Last Admin  ? albuterol (PROVENTIL) (2.5 MG/3ML) 0.083% nebulizer solution 2.5 mg  2.5 mg Nebulization Once Salena Saner, MD      ? ?BP 138/88   Pulse 81   Wt 81.5 kg (179 lb 9.6 oz)   SpO2 97%   BMI 28.99 kg/m?  ?General: NAD ?Neck: No JVD, no thyromegaly or thyroid nodule.  ?Lungs: Clear to auscultation bilaterally with normal respiratory effort. ?CV: Nondisplaced PMI.  Heart regular S1/S2, no S3/S4, no murmur.  No peripheral edema.  No carotid bruit.  Normal pedal pulses.  ?Abdomen: Soft, nontender, no hepatosplenomegaly, no distention.  ?Skin: Intact without lesions or rashes.  ?Neurologic: Alert and oriented x 3.  ?Psych: Normal affect. ?Extremities: No clubbing or cyanosis.  ?HEENT: Normal.  ? ?Assessment/Plan: ?1. Chronic systolic CHF: Nonischemic cardiomyopathy.  Last echo in 2/22 with EF 45-50%, cardiac MRI in 4/22 with LV EF 36% with mild-moderate LV dilation, RV EF 33%, no LGE.  Coronary CTA in 6/22 showed no significant CAD.  Given pulmonary sarcoidosis, cardiac sarcoidosis was a concern.  No delayed enhancement, however, was seen on cMRI.  Viral myocarditis from COVID-19 was also a concern, but again, no LGE was seen on cMRI.  Echo in 9/22 showed EF up to  45-50%.  Cardiac PET in 12/22 showed abnormal metabolism in the LV lateral wall consistent with mild-moderate inflammation from possible cardiac sarcoidosis, EF 53%.   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  Cardiac PET in 10/19/21 showed EF 50%, no abnormal metabolism (improved). He is not volume overloaded on exam, NYHA class II symptoms. Cardiac meds limited by low BP.  ?- Continue treatment with prednisone and methotrexate according to the [facility] protocol.  He will get Bactrim for PJP prophylaxis and calcium/vitamin D/omeprazole.  We are at goal dose MTX (20 mg/week) and titrating down prednisone (he is at 10 mg daily).  He should be off prednisone by June.  He seems to be tolerating MTX without major side effects.  ?- Need screening LFTs on MTX today, can soon decrease LFTs to every 2 months.  ?- He is getting married soon  and they are interested in having children.  Generally, recommend stopping MTX 3 months before trying to get pregnant.  In this situation, switching to adalimumab would be an option.  ?- Continue Toprol XL 12.5 mg qhs.  ?- Continue Entresto 1/2 tab 24/26 bid. BMET today.  ?- Continue Jardiance 10 mg daily.   ?2. Pulmonary sarcoidosis: Followed by pulmonary.  PET in 12/22 showed hypermetabolic areas in chest consistent with pulmonary sarcoidosis.  ?3. Asthma/COPD: No longer smoking.  Followed by pulmonary.  ?4. Distal esophageal FDG uptake: EGD showed no major abnormality per his report.  ?5. Weight gain: Suspect related to prednisone.  Hopefully will improve with prednisone weaning.  ? ?Followup in 3 months.  ? ?Marca Ancona ?11/21/2021 ? ?

## 2021-11-24 ENCOUNTER — Encounter (HOSPITAL_COMMUNITY): Payer: BC Managed Care – PPO | Admitting: Cardiology

## 2021-11-29 DIAGNOSIS — D8685 Sarcoid myocarditis: Secondary | ICD-10-CM | POA: Diagnosis not present

## 2021-12-25 ENCOUNTER — Other Ambulatory Visit (HOSPITAL_COMMUNITY): Payer: Self-pay

## 2021-12-25 ENCOUNTER — Encounter (HOSPITAL_COMMUNITY): Payer: Self-pay

## 2021-12-25 MED ORDER — FOLIC ACID 1 MG PO TABS: 1.0000 mg | ORAL_TABLET | Freq: Every day | ORAL | 3 refills | Status: DC

## 2021-12-25 MED ORDER — PREDNISONE 10 MG PO TABS: ORAL_TABLET | ORAL | 3 refills | Status: DC

## 2021-12-27 ENCOUNTER — Encounter: Payer: Self-pay | Admitting: Pulmonary Disease

## 2021-12-27 ENCOUNTER — Encounter: Payer: Self-pay | Admitting: Cardiovascular Disease

## 2021-12-27 MED ORDER — STIOLTO RESPIMAT 2.5-2.5 MCG/ACT IN AERS: 2.0000 | INHALATION_SPRAY | Freq: Every day | RESPIRATORY_TRACT | 11 refills | Status: DC

## 2021-12-28 ENCOUNTER — Encounter (HOSPITAL_COMMUNITY): Payer: Self-pay | Admitting: Cardiology

## 2021-12-29 ENCOUNTER — Other Ambulatory Visit (HOSPITAL_COMMUNITY): Payer: Self-pay | Admitting: *Deleted

## 2021-12-29 MED ORDER — METHOTREXATE 2.5 MG PO TABS
ORAL_TABLET | ORAL | 3 refills | Status: DC
Start: 2021-12-29 — End: 2022-02-23

## 2022-01-01 ENCOUNTER — Ambulatory Visit: Payer: BC Managed Care – PPO | Admitting: Cardiovascular Disease

## 2022-01-06 ENCOUNTER — Encounter (HOSPITAL_COMMUNITY): Payer: Self-pay | Admitting: Cardiology

## 2022-01-12 ENCOUNTER — Encounter (HOSPITAL_COMMUNITY): Payer: Self-pay

## 2022-01-12 DIAGNOSIS — I5020 Unspecified systolic (congestive) heart failure: Secondary | ICD-10-CM

## 2022-01-12 MED ORDER — PREDNISONE 5 MG PO TABS: 5.0000 mg | ORAL_TABLET | Freq: Every day | ORAL | 1 refills | Status: DC

## 2022-01-16 ENCOUNTER — Other Ambulatory Visit (HOSPITAL_COMMUNITY): Payer: BC Managed Care – PPO

## 2022-02-12 ENCOUNTER — Other Ambulatory Visit
Admission: RE | Admit: 2022-02-12 | Discharge: 2022-02-12 | Disposition: A | Discharge status: Home / Self Care | Payer: BC Managed Care – PPO | Attending: Pulmonary Disease | Admitting: Pulmonary Disease

## 2022-02-12 ENCOUNTER — Other Ambulatory Visit: Payer: Self-pay

## 2022-02-12 ENCOUNTER — Encounter: Payer: Self-pay | Admitting: Pulmonary Disease

## 2022-02-12 ENCOUNTER — Ambulatory Visit (INDEPENDENT_AMBULATORY_CARE_PROVIDER_SITE_OTHER): Payer: BC Managed Care – PPO | Admitting: Pulmonary Disease

## 2022-02-12 VITALS — BP 122/80 | HR 61 | Temp 97.9°F | Ht 66.0 in | Wt 173.4 lb

## 2022-02-12 DIAGNOSIS — M25542 Pain in joints of left hand: Secondary | ICD-10-CM | POA: Diagnosis not present

## 2022-02-12 DIAGNOSIS — Z5181 Encounter for therapeutic drug level monitoring: Secondary | ICD-10-CM | POA: Diagnosis not present

## 2022-02-12 DIAGNOSIS — D860 Sarcoidosis of lung: Secondary | ICD-10-CM

## 2022-02-12 DIAGNOSIS — M25541 Pain in joints of right hand: Secondary | ICD-10-CM

## 2022-02-12 DIAGNOSIS — J449 Chronic obstructive pulmonary disease, unspecified: Secondary | ICD-10-CM

## 2022-02-12 LAB — SEDIMENTATION RATE: Sed Rate: 4 mm/hr (ref 0–20)

## 2022-02-12 LAB — HEPATIC FUNCTION PANEL
ALT: 54 U/L — ABNORMAL HIGH (ref 0–44)
AST: 28 U/L (ref 15–41)
Albumin: 4.5 g/dL (ref 3.5–5.0)
Alkaline Phosphatase: 53 U/L (ref 38–126)
Bilirubin, Direct: 0.1 mg/dL (ref 0.0–0.2)
Indirect Bilirubin: 1 mg/dL — ABNORMAL HIGH (ref 0.3–0.9)
Total Bilirubin: 1.1 mg/dL (ref 0.3–1.2)
Total Protein: 7.9 g/dL (ref 6.5–8.1)

## 2022-02-12 LAB — C-REACTIVE PROTEIN: CRP: 0.6 mg/dL (ref <1.0)

## 2022-02-12 MED ORDER — STIOLTO RESPIMAT 2.5-2.5 MCG/ACT IN AERS: 2.0000 | INHALATION_SPRAY | Freq: Every day | RESPIRATORY_TRACT | 11 refills | Status: DC

## 2022-02-12 MED ORDER — STIOLTO RESPIMAT 2.5-2.5 MCG/ACT IN AERS: 2.0000 | INHALATION_SPRAY | Freq: Every day | RESPIRATORY_TRACT | 0 refills | Status: DC

## 2022-02-12 NOTE — Progress Notes (Signed)
Subjective:    Patient ID: Steve Wang, male    DOB: 02-Jan-1971, 51 y.o.   MRN: [id number] Patient Care Team: Birdie Sons, MD as PCP - General (Family Medicine) Rockey Situ Kathlene November, MD as PCP - Cardiology (Cardiology)  Chief Complaint  Patient presents with   Follow-up    SOB with exertion, occ wheezing and occ dry cough at times prod with clear sputum.    Requesting MD/Service: Self, primary care physician Lelon Huh, MD Date of initial consultation: 11 August 2020 Reason for consultation: History of sarcoidosis, GERD issues   PT PROFILE: 51 year old former smoker with biopsy-proven sarcoidosis previously diagnosed to managed at [facility].   Patient now with dyspnea on exertion.  HPI Steve Wang presents for follow-up.  Recall that we initially saw him in January 2022 as he wanted to establish with pulmonary due to having had a diagnosis of sarcoidosis.  He was initially diagnosed in the at the [facility].  He had been off of sarcoid treatment and appeared to have been in remission.  He complained of dyspnea out of proportion to the findings on CT of the chest and physical examination and therefore echocardiogram was ordered.  This showed significant changes from prior with decreased LVEF, LV cavity size severely dilated and grade 2 diastolic dysfunction.  He was referred to cardiology with the suspicion of cardiac sarcoid.  This has been confirmed by recent myocardial PET CT performed at [facility] in December 2022.  Since that time he has been placed on methotrexate for management of his cardiac sarcoid.  He is to have another myocardial PET/CT in November.   With regards to his dyspnea he feels that he is doing fairly well.  He notes that Stiolto helps with his symptoms and would like a refill today. Prior pulmonary function testing had shown evidence of obstructive defect, of note he does have emphysema noted as well on his CT.  He is a former smoker.    His only  complaint today is that of arthralgias no myalgias.  In particular he has issues with bilateral hand and shoulder pain that has been a chronic issue for him.  Not been evaluated by rheumatology previously.  This may be related to sarcoid arthritis.   DATA: 10/13/2010 CT chest: Report only, performed at [place]: Upper lung predominant parenchymal changes, scarring and traction bronchiectasis with small peribronchial and perifissural nodules.  Enlarged bilateral hilar and mediastinal lymph nodes, findings all suggestive of sarcoidosis. 11/03/2010: ACE level 98, eosinophils elevated 8.1% 11/09/2010 Bronchoscopy specimens: [facility], noncaseating granulomas right upper lobe, fungal culture negative for nocardia, Doratomyces species isolated, contaminant.  BAL 63% histiocytes, 33% lymphocytes, 2% neutrophils, 2% eosinophils.  Flow cytometry favored sarcoidosis. 09/21/2013 spirometry: Performed at [facility], FEV1 2.72 L or 76% predicted, FVC 4.42 L or 95% predicted, FEV1/FVC 61%, DLCO moderately reduced.  Consistent with mild obstructive defect (not congruent with sarcoidosis). 09/22/2018 2D echo: Performed in [place], [place], LVEF 55 to 60% mild LV dilatation, no wall motion abnormalities, otherwise normal. 08/11/2020 angiotensin-converting enzyme: 61 (reference range 14 to 82 units/L) 08/30/2020 CT chest with contrast: Perihilar traction bronchiectasis, parenchymal retraction and architectural distortion consistent with history of sarcoid.  Left ventricular dilatation.  3 mm left lower lobe nodule. 08/30/2020 echocardiogram: LVEF 45 to 50%, LV with mildly decreased function, global hypokinesis, LV cavity size severely dilated, grade II diastolic dysfunction. 10/24/2020 cardiac morphology MR: Mild to moderate dilated left ventricle, normal LV thickness, global hypokinesis,  no regional wall motion abnormalities.  Reduced RV and LV systolic function LVEF [id number].  No  evidence for infiltrative disease.  Negative for cardiac sarcoidosis. 12/15/2020 CT coronary morphology: Coronary calcium score of 0, no evidence of CAD.  Lung windows findings as previous. 07/06/2021 myocardial PET/CT [facility]): Abnormal perfusion of the myocardium, normal wall motion and normal thickening.  No coronary artery calcifications, evidence of extracardiac areas in the chest of hypermetabolism suggest sarcoidosis.  Focal increased FDG uptake in the distal esophagus query esophagitis. 08/14/2021 CT chest with contrast: Stable exam from prior, no progressive findings, architectural distortion and scarring bilaterally compatible with prior history of sarcoidosis.  Stable 3 mm left lower lobe pulmonary nodule.  Pulmonary emphysema. 10/05/2021 PFTs: 10/19/2021 PET CT myocardial [facility]): Normal myocardial perfusion, normal left ventricular systolic function, wall motion and thickening, dilated left ventricle.  No areas of extracardiac hypermetabolic activity.    Review of Systems A 10 point review of systems was performed and it is as noted above otherwise negative.  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   Stage 2 moderate COPD by GOLD classification ([place]) 11/06/2021   Former smoker 11/06/2021   Shortness of breath 11/06/2021   Right bundle branch block 09/10/2018   GERD (gastroesophageal reflux disease) 06/25/2018   Sarcoidosis of lung ([place]) 06/25/2018   Chronic right shoulder pain 06/25/2018   History of migraine 06/25/2018   History of kidney stones 06/25/2018   Social History   Tobacco Use   Smoking status: Former    Packs/day: 1.00    Years: 25.00    Total pack years: 25.00    Types: Cigarettes    Quit date: 07/23/2008    Years since quitting: 13.5   Smokeless tobacco: Former    Types: Chew    Quit date: 07/24/2020  Substance Use Topics   Alcohol use: Never   No Known Allergies  Current Meds  Medication Sig   folic acid  (FOLVITE) 1 MG tablet Take 1 tablet (1 mg total) by mouth daily.   methotrexate (RHEUMATREX) 2.5 MG tablet Take methotrexate 20 mg (8 tablets) every Friday.   Tiotropium Bromide-Olodaterol (STIOLTO RESPIMAT) 2.5-2.5 MCG/ACT AERS Inhale 2 puffs into the lungs daily.   Tiotropium Bromide-Olodaterol (STIOLTO RESPIMAT) 2.5-2.5 MCG/ACT AERS Inhale 2 puffs into the lungs daily.   Tiotropium Bromide-Olodaterol (STIOLTO RESPIMAT) 2.5-2.5 MCG/ACT AERS Inhale 2 puffs into the lungs daily.   [DISCONTINUED] metoprolol succinate (TOPROL XL) 25 MG 24 hr tablet Take 0.5 tablets (12.5 mg total) by mouth at bedtime.   [DISCONTINUED] sacubitril-valsartan (ENTRESTO) 24-26 MG Take 0.5 tablets by mouth 2 (two) times daily.   [DISCONTINUED] sulfamethoxazole-trimethoprim (BACTRIM DS) 800-160 MG tablet Take 1 tablet by mouth 3 (three) times a week. Every Mon, Wed, Fri   [DISCONTINUED] Vitamin D, Cholecalciferol, 25 MCG (1000 UT) CAPS Take 1 capsule by mouth daily.   Immunization History  Administered Date(s) Administered   Influenza,inj,Quad PF,6+ Mos 09/08/2012   Influenza-Unspecified 05/07/2011   Pneumococcal Polysaccharide-23 06/25/2018   Tdap 03/24/2013, 06/25/2018       Objective:   Physical Exam BP 122/80 (BP Location: Left Arm, Cuff Size: Normal)   Pulse 61   Temp 97.9 F (36.6 C) (Temporal)   Ht 5\' 6"  (1.676 m)   Wt 173 lb 6.4 oz (78.7 kg)   SpO2 98%   BMI 27.99 kg/m  GENERAL: Well-developed, well-nourished gentleman, no acute distress, fully ambulatory, no conversational dyspnea. HEAD: Normocephalic, atraumatic.  EYES: Pupils equal, round,  reactive to light.  No scleral icterus.  MOUTH: Dentition intact, no thrush.  Oral mucosa moist. NECK: Supple. No thyromegaly. Trachea midline. No JVD.  No adenopathy. PULMONARY: Good air entry bilaterally.  Somewhat coarse breath sounds with end expiratory wheezes otherwise, no adventitious sounds.  CARDIOVASCULAR: S1 and S2. Regular rate and rhythm.  No  rubs, murmurs or gallops heard. ABDOMEN: Benign. MUSCULOSKELETAL: No joint deformity, no clubbing, no edema.  No active synovitis noted. NEUROLOGIC: No overt focal deficit, no gait disturbance noted.  Speech is fluent. SKIN: Intact,warm,dry.  On limited exam no rashes. PSYCH: Mood and behavior normal.      Assessment & Plan:     ICD-10-CM   1. Sarcoidosis of lung (HCC)  D86.0 Sedimentation rate    C-reactive protein    Angiotensin converting enzyme    Hepatic function panel   With regards to the lung this sees this is quiescent Patient however has cardiac sarcoid Continue methotrexate    2. Stage 2 moderate COPD by GOLD classification (HCC)  J44.9    Continue Stiolto 2 puffs twice a day Continue as needed albuterol    3. Arthralgia of both hands  M25.541 Sedimentation rate   M25.542 Rheumatoid Factor    ANA,IFA RA Diag Pnl w/rflx Tit/Patn    Angiotensin converting enzyme    4. Medication monitoring encounter  Z51.81 Hepatic function panel   Hepatic function panel Patient on methotrexate     Orders Placed This Encounter  Procedures   Sedimentation rate    Standing Status:   Future    Number of Occurrences:   1    Standing Expiration Date:   08/15/2022   Rheumatoid Factor    Standing Status:   Future    Number of Occurrences:   1    Standing Expiration Date:   08/15/2022   C-reactive protein    Standing Status:   Future    Number of Occurrences:   1    Standing Expiration Date:   08/15/2022   ANA,IFA RA Diag Pnl w/rflx Tit/Patn    Standing Status:   Future    Number of Occurrences:   1    Standing Expiration Date:   08/15/2022   Angiotensin converting enzyme    Standing Status:   Future    Number of Occurrences:   1    Standing Expiration Date:   08/15/2022   Hepatic function panel    Standing Status:   Future    Number of Occurrences:   1    Standing Expiration Date:   08/15/2022   Meds ordered this encounter  Medications   Tiotropium Bromide-Olodaterol (STIOLTO  RESPIMAT) 2.5-2.5 MCG/ACT AERS    Sig: Inhale 2 puffs into the lungs daily.    Dispense:  4 g    Refill:  11   Tiotropium Bromide-Olodaterol (STIOLTO RESPIMAT) 2.5-2.5 MCG/ACT AERS    Sig: Inhale 2 puffs into the lungs daily.    Dispense:  4 g    Refill:  0    Order Specific Question:   Lot Number?    Answer:   [id number]    Order Specific Question:   Expiration Date?    Answer:   12/22/2023    Order Specific Question:   Quantity    Answer:   2   Will see the patient in follow-up in 2 to 3 months time he is to call sooner should any new problems arise.  Gailen Shelter, MD Advanced Bronchoscopy P[facility]    *  This note was dictated using voice recognition software/Dragon.  Despite best efforts to proofread, errors can occur which can change the meaning. Any transcriptional errors that result from this process are unintentional and may not be fully corrected at the time of dictation. Will see the patient in follow-up in 2 to 3 months time he is to call sooner should any new problems arise.   Gailen Shelter, MD Advanced Bronchoscopy PCCM [facility]    *This note was dictated using voice recognition software/Dragon.  Despite best efforts to proofread, errors can occur which can change the meaning. Any transcriptional errors that result from this process are unintentional and may not be fully corrected at the time of dictation.

## 2022-02-12 NOTE — Patient Instructions (Signed)
We are getting some lab work to delve more into your joint pains.  I have sent in a prescription for your Stiolto so your pharmacy should have an up-to-date 1.  We also are providing you with samples.  We will see you in follow-up in 2 to 3 months time call sooner should any new problems arise.

## 2022-02-12 NOTE — Progress Notes (Deleted)
   Subjective:    Patient ID: Steve Wang, male    DOB: 09-07-1970, 51 y.o.   MRN: [id number]  HPI    Review of Systems     Objective:   Physical Exam        Assessment & Plan:

## 2022-02-13 LAB — ANTINUCLEAR ANTIBODIES, IFA: ANA Ab, IFA: NEGATIVE

## 2022-02-13 LAB — ANGIOTENSIN CONVERTING ENZYME: Angiotensin-Converting Enzyme: 68 U/L (ref 14–82)

## 2022-02-13 LAB — RHEUMATOID FACTOR: Rheumatoid fact SerPl-aCnc: <10 IU/mL (ref <14.0)

## 2022-02-23 ENCOUNTER — Encounter (HOSPITAL_COMMUNITY): Payer: Self-pay | Admitting: Cardiology

## 2022-02-23 ENCOUNTER — Other Ambulatory Visit (HOSPITAL_COMMUNITY): Payer: Self-pay

## 2022-02-23 ENCOUNTER — Encounter (HOSPITAL_COMMUNITY): Payer: Self-pay

## 2022-02-23 MED ORDER — METHOTREXATE 2.5 MG PO TABS: ORAL_TABLET | ORAL | 11 refills | Status: DC

## 2022-03-04 ENCOUNTER — Other Ambulatory Visit (HOSPITAL_COMMUNITY): Payer: Self-pay | Admitting: Cardiology

## 2022-03-09 ENCOUNTER — Encounter: Payer: Self-pay | Admitting: Pulmonary Disease

## 2022-03-09 NOTE — Telephone Encounter (Signed)
We performed a complete panel of inflammatory markers testing on 24 July and these were good.  He has a follow-up with me in October and we will can recheck them again then but he was well controlled as of his last testing which was less than a month ago.

## 2022-03-09 NOTE — Telephone Encounter (Signed)
Dr. Gonzalez, please advise. Thanks 

## 2022-03-11 ENCOUNTER — Encounter (HOSPITAL_COMMUNITY): Payer: Self-pay

## 2022-03-11 ENCOUNTER — Encounter (HOSPITAL_COMMUNITY): Payer: Self-pay | Admitting: Cardiology

## 2022-03-14 ENCOUNTER — Encounter (HOSPITAL_COMMUNITY): Payer: BC Managed Care – PPO | Admitting: Cardiology

## 2022-03-16 ENCOUNTER — Encounter (HOSPITAL_COMMUNITY): Payer: Self-pay | Admitting: Cardiology

## 2022-03-16 ENCOUNTER — Ambulatory Visit (HOSPITAL_COMMUNITY)
Admission: RE | Admit: 2022-03-16 | Discharge: 2022-03-16 | Disposition: A | Discharge status: Home / Self Care | Payer: BC Managed Care – PPO | Source: Ambulatory Visit | Attending: Cardiology | Admitting: Cardiology

## 2022-03-16 VITALS — BP 110/70 | HR 61 | Wt 173.4 lb

## 2022-03-16 DIAGNOSIS — I428 Other cardiomyopathies: Secondary | ICD-10-CM | POA: Insufficient documentation

## 2022-03-16 DIAGNOSIS — I5020 Unspecified systolic (congestive) heart failure: Secondary | ICD-10-CM

## 2022-03-16 DIAGNOSIS — Z87891 Personal history of nicotine dependence: Secondary | ICD-10-CM | POA: Insufficient documentation

## 2022-03-16 DIAGNOSIS — R0600 Dyspnea, unspecified: Secondary | ICD-10-CM | POA: Insufficient documentation

## 2022-03-16 DIAGNOSIS — D8685 Sarcoid myocarditis: Secondary | ICD-10-CM | POA: Diagnosis not present

## 2022-03-16 DIAGNOSIS — I5022 Chronic systolic (congestive) heart failure: Secondary | ICD-10-CM | POA: Diagnosis not present

## 2022-03-16 DIAGNOSIS — D860 Sarcoidosis of lung: Secondary | ICD-10-CM | POA: Diagnosis not present

## 2022-03-16 DIAGNOSIS — J449 Chronic obstructive pulmonary disease, unspecified: Secondary | ICD-10-CM | POA: Insufficient documentation

## 2022-03-16 DIAGNOSIS — Z91148 Patient's other noncompliance with medication regimen for other reason: Secondary | ICD-10-CM | POA: Insufficient documentation

## 2022-03-16 DIAGNOSIS — M2550 Pain in unspecified joint: Secondary | ICD-10-CM | POA: Insufficient documentation

## 2022-03-16 DIAGNOSIS — Z79899 Other long term (current) drug therapy: Secondary | ICD-10-CM | POA: Diagnosis not present

## 2022-03-16 DIAGNOSIS — Z8616 Personal history of COVID-19: Secondary | ICD-10-CM | POA: Diagnosis not present

## 2022-03-16 LAB — COMPREHENSIVE METABOLIC PANEL
ALT: 28 U/L (ref 0–44)
AST: 20 U/L (ref 15–41)
Albumin: 4.1 g/dL (ref 3.5–5.0)
Alkaline Phosphatase: 49 U/L (ref 38–126)
Anion gap: 10 (ref 5–15)
BUN: 13 mg/dL (ref 6–20)
CO2: 25 mmol/L (ref 22–32)
Calcium: 9.7 mg/dL (ref 8.9–10.3)
Chloride: 104 mmol/L (ref 98–111)
Creatinine, Ser: 0.88 mg/dL (ref 0.61–1.24)
GFR, Estimated: >60 mL/min (ref >60)
Glucose, Bld: 94 mg/dL (ref 70–99)
Potassium: 4.4 mmol/L (ref 3.5–5.1)
Sodium: 139 mmol/L (ref 135–145)
Total Bilirubin: 0.7 mg/dL (ref 0.3–1.2)
Total Protein: 7.2 g/dL (ref 6.5–8.1)

## 2022-03-16 NOTE — Patient Instructions (Addendum)
EKG done today.   Labs done today. We will contact you only if your labs are abnormal.  No medication changes were made. Please continue all current medications as prescribed.  You have been referred to Rheumatology. They will contact you to schedule an appointment.  Your physician recommends that you schedule a follow-up appointment in: 2 months  If you have any questions or concerns before your next appointment please send [place] a message through [place] or call our office at [phone].    TO LEAVE A MESSAGE FOR THE NURSE SELECT OPTION 2, PLEASE LEAVE A MESSAGE INCLUDING: YOUR NAME DATE OF BIRTH CALL BACK NUMBER REASON FOR CALL**this is important as we prioritize the call backs  YOU WILL RECEIVE A CALL BACK THE SAME DAY AS LONG AS YOU CALL BEFORE 4:00 PM   Do the following things EVERYDAY: Weigh yourself in the morning before breakfast. Write it down and keep it in a log. Take your medicines as prescribed Eat low salt foods--Limit salt (sodium) to 2000 mg per day.  Stay as active as you can everyday Limit all fluids for the day to less than 2 liters   At the [facility], you and your health needs are our priority. As part of our continuing mission to provide you with exceptional heart care, we have created designated Provider Care Teams. These Care Teams include your primary Cardiologist (physician) and Advanced Practice Providers (APPs- Physician Assistants and Nurse Practitioners) who all work together to provide you with the care you need, when you need it.   You may see any of the following providers on your designated Care Team at your next follow up: Dr Arvilla Meres Dr Carron Curie, NP Robbie Lis, [place] Karle Plumber, PharmD   Please be sure to bring in all your medications bottles to every appointment.

## 2022-03-18 NOTE — Progress Notes (Signed)
PCP: Malva Limes, MD Cardiology: Dr. Mariah Milling HF Cardiology: Dr. Shirlee Latch  51 y.o. with history of pulmonary sarcoidosis, asthma/COPD, and nonischemic cardiomyopathy presents for followup of CHF.  Patient was diagnosed with sarcoidosis after bronchoscopy and presumably a biopsy in [place] in 4/12. He says that he took steroids and MTX x 1 year then was weaned off.  No immunosuppression since that time.  Echo in 3/20 was normal.  He had an echo in 2/22, after his 2nd bout with COVID-19, with EF down to 45-50%.  Given concern for cardiac involvement by sarcoidosis, he had a cardiac MRI in 4/22.  Despite moderately decreased LV and RV systolic function, no delayed enhancement was seen.  He subsequently had a coronary CTA in 6/22 that showed no significant coronary disease.  Patient also has a history of heavy smoking but quit in 2005.  His pulmonologist has said that he has asthma/COPD.    Repeat echo in 9/22 showed EF 45-50%, dilated LV, normal RV, mild MR.   Cardiac PET in 12/22 at [facility] (took a long time to get due to insurance issues) showed abnormal metabolism in the LV lateral wall consistent with mild-moderate inflammation from possible cardiac sarcoidosis, EF 53%; there were extracardiac hypermetabolic areas in the chest suggestive of sarcoidosis; focal FDG uptake distal esophagus, ?esophagitis versus malignancy.   CT chest in 1/23 with stable signs of pulmonary sarcoidosis.   Repeat cardiac PET at [facility] on 10/19/21 showed EF 50%, no abnormal metabolism (improved).    Patient returns for followup of cardiac sarcoidosis.  He is now off prednisone.  On his own, he decreased his MTX to 15 mg qwk.  He is now seeing a Dr. Mayford Knife in [place] who has been "treating inflammation."  Apparently, he was on ivermectin for a period of time but has stopped this.  He has also stopped Jardiance, Entresto, and Toprol XL.  Since stopping prednisone, he notes more joint pain in hands, ankles, knees.  Breathing is  about the same, possibly very mildly worse.  He notes dyspnea after walking up 2 flights of stairs.  No problems with ADLs.  Weight down 6 lbs.    ECG (personally reviewed): NSR, RBBB with new anterolateral TWIs  Labs (8/22): K 4.2, creatinine 0.86, BNP 80 Labs (1/23): hgb 15.7, K 4.5, creatinine 0.98, BNP 22 Labs (3/23): LFTs normal, K 4, creatinine 0.96 Labs (4/23): BNP 35  PMH: 1. Pulmonary sarcoidosis: Diagnosed in 4/12 in [place], he was on steroids + MTX for about a year.   - CT chest in 6/22 showed chronic interstitial changes consistent with sarcoidosis.  2. RBBB 3. GERD 4. Nonischemic cardiomyopathy:  - Echo (3/20) with EF 55-60%.  - Echo (2/22) with EF 45-50% - Cardiac MRI (4/22):  LV EF 36% with mild-moderate LV dilation, RV EF 33%, no LGE.  - Coronary CTA (6/22): Calcium score 0, no significant coronary disease noted.  - Echo (9/22): EF 45-50%, dilated LV, normal RV, mild MR.  - Cardiac PET (12/22): Abnormal metabolism in the LV lateral wall consistent with mild-moderate inflammation from possible cardiac sarcoidosis, EF 53%; there were extracardiac hypermetabolic areas in the chest suggestive of sarcoidosis; focal FDG uptake distal esophagus, ?esophagitis versus malignancy.  - Cardiac PET (10/19/21): EF 50%, no abnormal metabolism (improved).  5. Asthma/COPD: Quit smoking around 2005.  6. COVID-19 in 10/20 and 1/22.  7. GERD  SH: No ETOH, drugs.  Quit smoking in 2005.  Works in [employer].  Lives in [place].   FH: Unsure  of cause of father's death.    ROS: All systems reviewed and negative except as per HPI.   Current Outpatient Medications  Medication Sig Dispense Refill   folic acid (FOLVITE) 1 MG tablet Take 1 tablet (1 mg total) by mouth daily. 90 tablet 3   JARDIANCE 10 MG TABS tablet TAKE 1 TABLET BY MOUTH DAILY BEFORE BREAKFAST. 90 tablet 3   methotrexate (RHEUMATREX) 2.5 MG tablet Take 2.5 mg by mouth once a week. Caution:Chemotherapy. Protect from light.    6  tablets     Tiotropium Bromide-Olodaterol (STIOLTO RESPIMAT) 2.5-2.5 MCG/ACT AERS Inhale 2 puffs into the lungs daily. 4 g 11   No current facility-administered medications for this encounter.   Facility-Administered Medications Ordered in Other Encounters  Medication Dose Route Frequency Provider Last Rate Last Admin   albuterol (PROVENTIL) (2.5 MG/3ML) 0.083% nebulizer solution 2.5 mg  2.5 mg Nebulization Once Salena Saner, MD       BP 110/70   Pulse 61   Wt 78.7 kg (173 lb 6.4 oz)   SpO2 97%   BMI 27.99 kg/m  General: NAD Neck: No JVD, no thyromegaly or thyroid nodule.  Lungs: Clear to auscultation bilaterally with normal respiratory effort. CV: Nondisplaced PMI.  Heart regular S1/S2, no S3/S4, no murmur.  No peripheral edema.  No carotid bruit.  Normal pedal pulses.  Abdomen: Soft, nontender, no hepatosplenomegaly, no distention.  Skin: Intact without lesions or rashes.  Neurologic: Alert and oriented x 3.  Psych: Normal affect. Extremities: No clubbing or cyanosis.  HEENT: Normal.   Assessment/Plan: 1. Chronic systolic CHF: Nonischemic cardiomyopathy.  Echo in 2/22 with EF 45-50%, cardiac MRI in 4/22 with LV EF 36% with mild-moderate LV dilation, RV EF 33%, no LGE.  Coronary CTA in 6/22 showed no significant CAD.  Given pulmonary sarcoidosis, cardiac sarcoidosis was a concern.  No delayed enhancement, however, was seen on cMRI.  Viral myocarditis from COVID-19 was also a concern, but again, no LGE was seen on cMRI.  Echo in 9/22 showed EF up to 45-50%.  Cardiac PET in 12/22 showed abnormal metabolism in the LV lateral wall consistent with mild-moderate inflammation from possible cardiac sarcoidosis, EF 53%.   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  Cardiac PET in 10/19/21 showed EF 50%, no abnormal metabolism (improved). He is now off prednisone and has tapered MTX down to 15 mg/wk on his own. He is  not volume overloaded on exam, NYHA class II symptoms, possibly slightly worse. He has stopped Entresto, Jardiance, and Toprol XL (not sure why).  - Continue MTX at 15 mg/wk for now, I asked him not to decrease it further.  I would recommend avoiding non-proven therapies such as ivermectin.  He seems to be tolerating MTX without major side effects.  - Need screening LFTs on MTX today, can decrease LFTs to every 2 months.  - He recently got married and is interested in having children.  Generally, recommend stopping MTX 3 months before trying to get pregnant.  I am going to refer him to Dr. Sheliah Hatch with rheumatology to see about transitioning him from MTX to adalimumab.  He also has reported increased joint pain off prednisone.  - Plan for repeat echo soon, if EF is decreased would recommend he restart GDMT.   - He has a cardiac PET scheduled for 06/06/22, at this point he will hopefully be on adalimumab.   2. Pulmonary  sarcoidosis: Followed by pulmonary.  PET in 12/22 showed hypermetabolic areas in chest consistent with pulmonary sarcoidosis.  3. Asthma/COPD: No longer smoking.  Followed by pulmonary.  4. Distal esophageal FDG uptake: EGD showed no major abnormality per his report.   Followup in 2 months.   Marca Ancona 03/18/2022

## 2022-03-19 ENCOUNTER — Ambulatory Visit: Payer: BC Managed Care – PPO | Attending: Cardiology

## 2022-03-19 DIAGNOSIS — I5020 Unspecified systolic (congestive) heart failure: Secondary | ICD-10-CM

## 2022-03-19 LAB — ECHOCARDIOGRAM COMPLETE
AR max vel: 2.57 cm2
AV Area VTI: 2.49 cm2
AV Area mean vel: 2.49 cm2
AV Mean grad: 3 mmHg
AV Peak grad: 4.7 mmHg
Ao pk vel: 1.08 m/s
Area-P 1/2: 3.17 cm2
Calc EF: 41.1 %
S' Lateral: 5.6 cm
Single Plane A2C EF: 39 %
Single Plane A4C EF: 45 %

## 2022-03-20 ENCOUNTER — Ambulatory Visit (INDEPENDENT_AMBULATORY_CARE_PROVIDER_SITE_OTHER): Payer: BC Managed Care – PPO | Admitting: Podiatry

## 2022-03-20 ENCOUNTER — Encounter (HOSPITAL_COMMUNITY): Payer: Self-pay | Admitting: Cardiology

## 2022-03-20 DIAGNOSIS — M7662 Achilles tendinitis, left leg: Secondary | ICD-10-CM | POA: Diagnosis not present

## 2022-03-20 NOTE — Progress Notes (Signed)
Subjective:  Patient ID: Steve Wang, male    DOB: 07-02-1971,  MRN: [id number]  Chief Complaint  Patient presents with   Plantar Fasciitis    51 y.o. male presents with the above complaint.  Patient presents with complaint of left Achilles tendinitis.  Patient states that he is having pain first thing in the morning there is some discomfort.  He wanted to have his feet looked at.  There are some tenderness associated with it.  Hurts with ambulation hurts with pressure.  Pain scale 7 out of 10.  Hurts first thing when he is getting out of any sitting or sleeping position.  He usually push through pain.  Is been normal for quite some time.  He denies any other acute complaints.   Review of Systems: Negative except as noted in the HPI. Denies N/V/F/Ch.  Past Medical History:  Diagnosis Date   COPD (chronic obstructive pulmonary disease) (HCC)    Emphysema of lung (HCC)    GERD (gastroesophageal reflux disease)    HFrEF (heart failure with reduced ejection fraction) (HCC)    a. 09/2018 Echo: EF 55-60%; b. 08/2020 Echo: EF 45-50%, grade 2 diastolic dysfunction; c. 10/2020 cMRI: EF 36%, mild to mod dil LV. Mod red RV fxn. No LGE/evidence of sarcoid. No signif valvular dzs   NICM (nonischemic cardiomyopathy) (HCC)    a. 09/2018 Echo: EF 55-60%; b. 08/2020 Echo: EF 45-50%; c. 10/2020 cMRI: EF 36%, mild to mod dil LV. Mod red RV fxn. No LGE/evidence of sarcoid. No signif valvular dzs; d. 12/2020 Cor CTA: Ca2+= 0. Nl cors.   Pulmonary nodule    a. 12/2020 CT chest: 1.3cm posterior basal LUL nodule and 80mm LLL nodule - unchanged.   RBBB    Sarcoidosis    a. Dx 10/2020 in [place] - prev on steroids/methotrexate.    Current Outpatient Medications:    folic acid (FOLVITE) 1 MG tablet, Take 1 tablet (1 mg total) by mouth daily., Disp: 90 tablet, Rfl: 3   JARDIANCE 10 MG TABS tablet, TAKE 1 TABLET BY MOUTH DAILY BEFORE BREAKFAST., Disp: 90 tablet, Rfl: 3   methotrexate (RHEUMATREX) 2.5 MG tablet, Take 2.5  mg by mouth once a week. Caution:Chemotherapy. Protect from light.    6 tablets, Disp: , Rfl:    Tiotropium Bromide-Olodaterol (STIOLTO RESPIMAT) 2.5-2.5 MCG/ACT AERS, Inhale 2 puffs into the lungs daily., Disp: 4 g, Rfl: 11 No current facility-administered medications for this visit.  Facility-Administered Medications Ordered in Other Visits:    albuterol (PROVENTIL) (2.5 MG/3ML) 0.083% nebulizer solution 2.5 mg, 2.5 mg, Nebulization, Once, Salena Saner, MD  Social History   Tobacco Use  Smoking Status Former   Packs/day: 1.00   Years: 25.00   Total pack years: 25.00   Types: Cigarettes   Quit date: 07/23/2008   Years since quitting: 13.6  Smokeless Tobacco Former   Types: Chew   Quit date: 07/24/2020    No Known Allergies Objective:  There were no vitals filed for this visit. There is no height or weight on file to calculate BMI. Constitutional Well developed. Well nourished.  Vascular Dorsalis pedis pulses palpable bilaterally. Posterior tibial pulses palpable bilaterally. Capillary refill normal to all digits.  No cyanosis or clubbing noted. Pedal hair growth normal.  Neurologic Normal speech. Oriented to person, place, and time. Epicritic sensation to light touch grossly present bilaterally.  Dermatologic Nails well groomed and normal in appearance. No open wounds. No skin lesions.  Orthopedic: Pain on palpation to the Achilles  tendon insertion.  Pain with dorsiflexion of the foot no pain with plantarflexion of the foot.  No pain at the ATFL ligament peroneal tendon or posterior tibial tendon.   Radiographs: None Assessment:   1. Achilles tendinitis, left leg    Plan:  Patient was evaluated and treated and all questions answered.  Left Achilles tendinitis -All questions and concerns were discussed with the patient in extensive detail given the amount of pain that he is experiencing benefit from immobilization of the Achilles tendon with a cam boot.  Patient  agrees with plan and would like to proceed with immobilization. -Can boot was dispensed  No follow-ups on file.

## 2022-03-22 ENCOUNTER — Other Ambulatory Visit (HOSPITAL_COMMUNITY): Payer: BC Managed Care – PPO

## 2022-03-27 ENCOUNTER — Telehealth: Payer: Self-pay

## 2022-03-27 NOTE — Telephone Encounter (Signed)
No further notes are needed  

## 2022-03-29 NOTE — Telephone Encounter (Addendum)
Spoke with patient, still not back on the medications yet. Stated he will either call us or send [place] a my chart message when he does so we can order labs for him at [facility]  ----- Message from Laurey Morale, MD sent at 03/23/2022  3:52 PM EDT ----- I asked him to increase MTX back to 20 mg qwk and to restart prior doses of Toprol XL, Jardiance, and Entresto with lower EF.  He is thinking about the GDMT.  We will need to call next week, if he starts back will need BMET 1 week.

## 2022-04-02 ENCOUNTER — Other Ambulatory Visit (HOSPITAL_COMMUNITY): Payer: Self-pay

## 2022-04-02 ENCOUNTER — Encounter (HOSPITAL_COMMUNITY): Payer: Self-pay | Admitting: Cardiology

## 2022-04-02 DIAGNOSIS — I5022 Chronic systolic (congestive) heart failure: Secondary | ICD-10-CM

## 2022-04-02 MED ORDER — EMPAGLIFLOZIN 10 MG PO TABS: 10.0000 mg | ORAL_TABLET | Freq: Every day | ORAL | 3 refills | Status: AC

## 2022-04-02 MED ORDER — ENTRESTO 24-26 MG PO TABS: 0.5000 | ORAL_TABLET | Freq: Two times a day (BID) | ORAL | 3 refills | Status: DC

## 2022-04-02 MED ORDER — METOPROLOL SUCCINATE ER 25 MG PO TB24: 12.5000 mg | ORAL_TABLET | Freq: Every day | ORAL | 3 refills | Status: DC

## 2022-04-03 ENCOUNTER — Encounter (HOSPITAL_COMMUNITY): Payer: Self-pay | Admitting: Cardiology

## 2022-04-03 ENCOUNTER — Telehealth (HOSPITAL_COMMUNITY): Payer: Self-pay

## 2022-04-03 NOTE — Telephone Encounter (Signed)
Patient just want to make sure that it was ok to take his medications and some other medications he is on. Checked micronmedex to look for compatibility. Reassured patein all medications were ok to take.

## 2022-04-04 ENCOUNTER — Telehealth: Payer: Self-pay

## 2022-04-04 NOTE — Telephone Encounter (Signed)
Patient came into the [place] office today. He states that the new boot he was given last week is broken again. He asked for another new boot. Advised the patient that insurance will likely not pay for a third boot and that the price is $400.00. Patient was presented with the DME form to sign and declined to sign or accept the new boot. Advised that he could look on [employer] for a more affordable option. Patient plans to come back by the [facility] office tomorrow when Dr. Allena Katz is here.

## 2022-04-17 ENCOUNTER — Ambulatory Visit: Payer: BC Managed Care – PPO | Admitting: Podiatry

## 2022-04-17 ENCOUNTER — Inpatient Hospital Stay: Admit: 2022-04-17 | Payer: BC Managed Care – PPO

## 2022-04-17 ENCOUNTER — Other Ambulatory Visit
Admission: RE | Admit: 2022-04-17 | Discharge: 2022-04-17 | Disposition: A | Discharge status: Home / Self Care | Payer: BC Managed Care – PPO | Attending: Cardiology | Admitting: Cardiology

## 2022-04-17 DIAGNOSIS — D8685 Sarcoid myocarditis: Secondary | ICD-10-CM | POA: Diagnosis not present

## 2022-04-17 DIAGNOSIS — I5022 Chronic systolic (congestive) heart failure: Secondary | ICD-10-CM | POA: Insufficient documentation

## 2022-04-17 DIAGNOSIS — D860 Sarcoidosis of lung: Secondary | ICD-10-CM

## 2022-04-17 LAB — BASIC METABOLIC PANEL
Anion gap: 6 (ref 5–15)
BUN: 16 mg/dL (ref 6–20)
CO2: 25 mmol/L (ref 22–32)
Calcium: 9.7 mg/dL (ref 8.9–10.3)
Chloride: 107 mmol/L (ref 98–111)
Creatinine, Ser: 0.86 mg/dL (ref 0.61–1.24)
GFR, Estimated: >60 mL/min (ref >60)
Glucose, Bld: 103 mg/dL — ABNORMAL HIGH (ref 70–99)
Potassium: 4.3 mmol/L (ref 3.5–5.1)
Sodium: 138 mmol/L (ref 135–145)

## 2022-04-17 LAB — HEPATIC FUNCTION PANEL
ALT: 41 U/L (ref 0–44)
AST: 32 U/L (ref 15–41)
Albumin: 4.6 g/dL (ref 3.5–5.0)
Alkaline Phosphatase: 55 U/L (ref 38–126)
Bilirubin, Direct: 0.1 mg/dL (ref 0.0–0.2)
Indirect Bilirubin: 0.8 mg/dL (ref 0.3–0.9)
Total Bilirubin: 0.9 mg/dL (ref 0.3–1.2)
Total Protein: 8.2 g/dL — ABNORMAL HIGH (ref 6.5–8.1)

## 2022-04-17 NOTE — Addendum Note (Signed)
Addended by: Dekisha Mesmer C on: 04/17/2022 10:45 AM   Modules accepted: Orders  

## 2022-04-17 NOTE — Addendum Note (Signed)
Addended by: Prudence Heiny C on: 04/17/2022 10:45 AM   Modules accepted: Orders  

## 2022-04-17 NOTE — Addendum Note (Signed)
Addended by: Antonieta Iba C on: 04/17/2022 10:45 AM   Modules accepted: Orders

## 2022-04-23 ENCOUNTER — Ambulatory Visit (INDEPENDENT_AMBULATORY_CARE_PROVIDER_SITE_OTHER): Payer: BC Managed Care – PPO | Admitting: Pulmonary Disease

## 2022-04-23 ENCOUNTER — Other Ambulatory Visit (HOSPITAL_COMMUNITY): Payer: Self-pay

## 2022-04-23 ENCOUNTER — Encounter: Payer: Self-pay | Admitting: Pulmonary Disease

## 2022-04-23 VITALS — BP 100/60 | HR 64 | Temp 98.1°F | Ht 66.0 in | Wt 169.2 lb

## 2022-04-23 DIAGNOSIS — J209 Acute bronchitis, unspecified: Secondary | ICD-10-CM

## 2022-04-23 DIAGNOSIS — D8685 Sarcoid myocarditis: Secondary | ICD-10-CM | POA: Diagnosis not present

## 2022-04-23 DIAGNOSIS — J440 Chronic obstructive pulmonary disease with acute lower respiratory infection: Secondary | ICD-10-CM | POA: Diagnosis not present

## 2022-04-23 DIAGNOSIS — D860 Sarcoidosis of lung: Secondary | ICD-10-CM | POA: Diagnosis not present

## 2022-04-23 DIAGNOSIS — Z87891 Personal history of nicotine dependence: Secondary | ICD-10-CM

## 2022-04-23 DIAGNOSIS — R0602 Shortness of breath: Secondary | ICD-10-CM | POA: Diagnosis not present

## 2022-04-23 DIAGNOSIS — M25511 Pain in right shoulder: Secondary | ICD-10-CM

## 2022-04-23 DIAGNOSIS — J4489 Other specified chronic obstructive pulmonary disease: Secondary | ICD-10-CM

## 2022-04-23 LAB — NITRIC OXIDE: FeNO level (ppb): 21

## 2022-04-23 MED ORDER — NAPROXEN SODIUM 550 MG PO TABS: 550.0000 mg | ORAL_TABLET | Freq: Two times a day (BID) | ORAL | 1 refills | Status: DC

## 2022-04-23 MED ORDER — AZITHROMYCIN 250 MG PO TABS: ORAL_TABLET | ORAL | 0 refills | Status: AC

## 2022-04-23 NOTE — Progress Notes (Signed)
Subjective:    Patient ID: Steve Wang, male    DOB: [birth year], 51 y.o.   MRN: [id number] Patient Care Team: Birdie Sons, MD as PCP - General (Family Medicine) Minna Merritts, MD as PCP - Cardiology (Cardiology) Tyler Pita, MD as Consulting Physician (Pulmonary Disease)  Requesting MD/Service: Self, primary care physician Lelon Huh, MD Date of initial consultation: 11 August 2020 Reason for consultation: History of sarcoidosis, GERD issues   PT PROFILE: 51 year old former smoker with biopsy-proven sarcoidosis previously diagnosed to managed at [facility]. Patient now with dyspnea on exertion.  Chief Complaint  Patient presents with   Follow-up    Sarcoidosis of lung. Occasional SOB. Coughing up green sputum.   HPI Steve Wang is a 51 year old former smoker with 25-pack-year history of [place] and a history of sarcoidosis first diagnosed in 2012 at the [facility] by lung biopsy.  She also diagnosed with cardiac sarcoid in December 2022 and has been on methotrexate.  He also has an element of COPD from prior smoking PFTs have consistently shown moderate obstruction.  Patient is on Stiolto for his COPD issues.  He has not shown any findings consistent with asthmatic component.  He recently had methotrexate adjusted by cardiology.  He has been alternating between [facility] cardiology and [facility] cardiology.  His shortness of breath is fairly baseline.  He does note it most with exertion.  He does not note wheezing per se but has noted over the last few days some cough productive of yellowish to greenish sputum.  Has had no fevers, chills or sweats.  No hemoptysis.  His only other complaint today is that he has been having some right shoulder pain that has plagued him over the years but has gotten worse.  He is developing some decrease function on the shoulder due to pain.  He notes that no medication really mitigates the pain.  He has not seen an  orthopedist.   DATA: 10/13/2010 CT chest: Report only, performed at [facility]: Upper lung predominant parenchymal changes, scarring and traction bronchiectasis with small peribronchial and perifissural nodules.  Enlarged bilateral hilar and mediastinal lymph nodes, findings all suggestive of sarcoidosis. 11/03/2010: ACE level 98, eosinophils elevated 8.1% 11/09/2010 Bronchoscopy specimens: [facility], noncaseating granulomas right upper lobe, fungal culture negative for nocardia, Doratomyces species isolated, contaminant.  BAL 63% histiocytes, 33% lymphocytes, 2% neutrophils, 2% eosinophils.  Flow cytometry favored sarcoidosis. 09/21/2013 spirometry: Performed at [facility], FEV1 2.72 L or 76% predicted, FVC 4.42 L or 95% predicted, FEV1/FVC 61%, DLCO moderately reduced.  Consistent with mild obstructive defect (not congruent with sarcoidosis). 09/22/2018 2D echo: Performed in [place], [place], LVEF 55 to 60% mild LV dilatation, no wall motion abnormalities, otherwise normal. 08/11/2020 angiotensin-converting enzyme: 61 (reference range 14 to 82 units/L) 08/30/2020 CT chest with contrast: Perihilar traction bronchiectasis, parenchymal retraction and architectural distortion consistent with history of sarcoid.  Left ventricular dilatation.  3 mm left lower lobe nodule. 08/30/2020 echocardiogram: LVEF 45 to 50%, LV with mildly decreased function, global hypokinesis, LV cavity size severely dilated, grade II diastolic dysfunction. 10/24/2020 cardiac morphology MR: Mild to moderate dilated left ventricle, normal LV thickness, global hypokinesis, no regional wall motion abnormalities.  Reduced RV and LV systolic function LVEF 66%.  No evidence for infiltrative disease.  Negative for cardiac sarcoidosis. 12/15/2020 CT coronary morphology: Coronary calcium score of 0, no evidence of CAD.  Lung windows findings as previous. 07/06/2021 myocardial PET/CT [facility]): Abnormal  perfusion of the  myocardium, normal wall motion and normal thickening.  No coronary artery calcifications, evidence of extracardiac areas in the chest of hypermetabolism suggest sarcoidosis.  Focal increased FDG uptake in the distal esophagus query esophagitis. 08/14/2021 CT chest with contrast: Stable exam from prior, no progressive findings, architectural distortion and scarring bilaterally compatible with prior history of sarcoidosis.  Stable 3 mm left lower lobe pulmonary nodule.  Pulmonary emphysema. 10/05/2021 PFTs: FEV1 2.21 L or 64% predicted, FVC 3.86 L or 87% predicted, FEV1/FVC 57%, low normal lung volumes, diffusion capacity normal.  Consistent with moderate obstruction.   10/19/2021 PET CT myocardial [facility]): Normal myocardial perfusion, normal left ventricular systolic function, wall motion and thickening, dilated left ventricle.  No areas of extracardiac hypermetabolic activity. 03/19/2022 2D echo: LVEF 40 to 45%, left ventricle demonstrates global hypokinesis. LVEF slightly worse than 08/2020 echocardiogram.  Review of Systems A 10 point review of systems was performed and it is as noted above otherwise negative.  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   Stage 2 moderate COPD by GOLD classification (HCC) 11/06/2021   Former smoker 11/06/2021   Shortness of breath 11/06/2021   Right bundle branch block 09/10/2018   GERD (gastroesophageal reflux disease) 06/25/2018   Sarcoidosis of lung (HCC) 06/25/2018   Chronic right shoulder pain 06/25/2018   History of migraine 06/25/2018   History of kidney stones 06/25/2018   Social History   Tobacco Use   Smoking status: Former    Packs/day: 1.00    Years: 25.00    Total pack years: 25.00    Types: Cigarettes    Quit date: 07/23/2008    Years since quitting: 13.8   Smokeless tobacco: Former    Types: Chew    Quit date: 07/24/2020  Substance Use Topics   Alcohol use: Never   No Known  Allergies  Current Meds  Medication Sig   [EXPIRED] azithromycin (ZITHROMAX) 250 MG tablet Take 2 tablets (500 mg) on  Day 1,  followed by 1 tablet (250 mg) once daily on Days 2 through 5.   empagliflozin (JARDIANCE) 10 MG TABS tablet Take 1 tablet (10 mg total) by mouth daily before breakfast.   folic acid (FOLVITE) 1 MG tablet Take 1 tablet (1 mg total) by mouth daily.   metoprolol succinate (TOPROL XL) 25 MG 24 hr tablet Take 0.5 tablets (12.5 mg total) by mouth daily.   naproxen sodium (ANAPROX DS) 550 MG tablet Take 1 tablet (550 mg total) by mouth 2 (two) times daily with a meal.   sacubitril-valsartan (ENTRESTO) 24-26 MG Take 0.5 tablets by mouth 2 (two) times daily.   Tiotropium Bromide-Olodaterol (STIOLTO RESPIMAT) 2.5-2.5 MCG/ACT AERS Inhale 2 puffs into the lungs daily.   [DISCONTINUED] methotrexate (RHEUMATREX) 2.5 MG tablet Take 2.5 mg by mouth once a week. Caution:Chemotherapy. Protect from light.    6 tablets   Immunization History  Administered Date(s) Administered   Influenza,inj,Quad PF,6+ Mos 09/08/2012   Influenza-Unspecified 05/07/2011   Pneumococcal Polysaccharide-23 06/25/2018   Tdap 03/24/2013, 06/25/2018       Objective:   Physical Exam BP 100/60 (BP Location: Left Arm, Cuff Size: Normal)   Pulse 64   Temp 98.1 F (36.7 C)   Ht 5\' 6"  (1.676 m)   Wt 169 lb 3.2 oz (76.7 kg)   SpO2 98%   BMI 27.31 kg/m  GENERAL: Well-developed, well-nourished gentleman, no acute distress, fully ambulatory, no conversational dyspnea. HEAD: Normocephalic, atraumatic.  EYES: Pupils equal, round, reactive  to light.  No scleral icterus.  MOUTH: Nose/mouth/throat not examined due to masking requirements for COVID 19. NECK: Supple. No thyromegaly. Trachea midline. No JVD.  No adenopathy. PULMONARY: Good air entry bilaterally.  Somewhat coarse breath sounds with end expiratory wheezes otherwise, no adventitious sounds.  CARDIOVASCULAR: S1 and S2. Regular rate and rhythm.  No rubs,  murmurs or gallops heard. ABDOMEN: Benign. MUSCULOSKELETAL: No joint deformity, no clubbing, no edema.  Tenderness along the right shoulder. NEUROLOGIC: No overt focal deficit, no gait disturbance noted.  Speech is fluent. SKIN: Intact,warm,dry.  On limited exam no rashes. PSYCH: Mood and behavior normal.   04/23/2022 Nitirc Oxide : 21       Assessment & Plan:     ICD-10-CM   1. Sarcoidosis of lung (HCC)  D86.0    With regards to pulmonary symptoms appears quiescent PET/CT [facility] 10/19/2021 with no extra cardiac activity    2. Cardiac sarcoidosis  D86.85    Follows with cardiology at [facility] Last PET/CT 10/19/2021 with no increase activity On methotrexate for this    3. 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 Acute bronchitis with COPD (HCC)  J44.0    J20.9    Azithromycin 5-day course No steroids indicated    5. SOB (shortness of breath)  R06.02 Nitric oxide   Is multifactorial Does have moderate COPD which appears well compensated Slight worsening of LVEF on 2D echo    6. Right shoulder pain, unspecified chronicity  M25.511 DG Shoulder Right    AMB referral to orthopedics   Referral to Ortho Shoulder x-ray Trial of naproxen sodium    7. Former smoker  Z87.891    No evidence of relapse     Orders Placed This Encounter  Procedures   DG Shoulder Right    Standing Status:   Future    Standing Expiration Date:   07/24/2022    Order Specific Question:   Reason for Exam (SYMPTOM  OR DIAGNOSIS REQUIRED)    Answer:   Pain    Order Specific Question:   Preferred imaging location?    Answer:   [facility]   AMB referral to orthopedics    Referral Priority:   Routine    Referral Type:   Consultation    Number of Visits Requested:   1   Nitric oxide   Meds ordered this encounter   Medications   naproxen sodium (ANAPROX DS) 550 MG tablet    Sig: Take 1 tablet (550 mg total) by mouth 2 (two) times daily with a meal.    Dispense:  60 tablet    Refill:  1   azithromycin (ZITHROMAX) 250 MG tablet    Sig: Take 2 tablets (500 mg) on  Day 1,  followed by 1 tablet (250 mg) once daily on Days 2 through 5.    Dispense:  6 each    Refill:  0   We will see the patient in follow-up in 4 months time.  He is to call sooner should any new difficulties arise.  Gailen Shelter, MD Advanced Bronchoscopy PCCM [facility]    *This note was dictated using voice recognition software/Dragon.  Despite best efforts to proofread, errors can occur which can change the meaning. Any transcriptional errors that result from this process are unintentional and may  not be fully corrected at the time of dictation.

## 2022-04-23 NOTE — Patient Instructions (Addendum)
We have ordered some shoulder films.  We will send a referral to orthopedics.  They will call you with the appointment.  I sent an antibiotic prescription to your pharmacy.  Sent a pain medication to your pharmacy.  Is an anti-inflammatory.  Your airway does not have inflammation by the test be performed today.  See you in follow-up in 4 months time call sooner should any new problems arise.

## 2022-05-01 ENCOUNTER — Encounter: Payer: Self-pay | Admitting: Pulmonary Disease

## 2022-05-02 MED ORDER — METHYLPREDNISOLONE 4 MG PO TBPK: ORAL_TABLET | ORAL | 0 refills | Status: DC

## 2022-05-02 MED ORDER — BENZONATATE 200 MG PO CAPS: 200.0000 mg | ORAL_CAPSULE | Freq: Three times a day (TID) | ORAL | 0 refills | Status: DC | PRN

## 2022-05-02 NOTE — Telephone Encounter (Signed)
We could send some Tessalon Perles (benzonatate) 200 mg, 3 times daily as needed.  #30 0 refills. We can also send in a Medrol Dosepak.

## 2022-05-02 NOTE — Telephone Encounter (Signed)
The pharmacy is saying that my insurance doesn't cover the benzoate is there something else equivalent or is the other steroid good enough to take by itself  Dr. Patsey Berthold, please advise. Thanks

## 2022-05-02 NOTE — Telephone Encounter (Signed)
Dr. Gonzalez, please advise. Thanks 

## 2022-05-04 ENCOUNTER — Encounter (HOSPITAL_COMMUNITY): Payer: Self-pay | Admitting: Cardiology

## 2022-05-07 ENCOUNTER — Other Ambulatory Visit (HOSPITAL_COMMUNITY): Payer: Self-pay | Admitting: *Deleted

## 2022-05-07 MED ORDER — METHOTREXATE SODIUM 2.5 MG PO TABS: 2.5000 mg | ORAL_TABLET | ORAL | 0 refills | Status: DC

## 2022-05-11 NOTE — Telephone Encounter (Signed)
Is he taking the Entresto?  Did he have ANY improvement with the Medrol?  Over-the-counter he can try Mucinex DM extra strength twice a day sometimes this can cause a dry cough.  He will need also an acute appointment to see [place] to reevaluate.  If he is having reflux he may want to check with Dr. Jimmye Norman in this regard to see what they recommend.

## 2022-05-11 NOTE — Telephone Encounter (Signed)
Dr. Gonzalez, please advise. Thanks 

## 2022-05-11 NOTE — Telephone Encounter (Signed)
I think this may be a side effect of the Entresto as it does have an ACE inhibitor.  It can give him that sensation that something is going on in the throat as well.  He could check with his cardiologist to see if we can hold it off for at least 4 to 6 weeks to see if the cough starts getting better.

## 2022-05-16 ENCOUNTER — Encounter (HOSPITAL_COMMUNITY): Payer: BC Managed Care – PPO | Admitting: Cardiology

## 2022-05-18 ENCOUNTER — Other Ambulatory Visit (HOSPITAL_COMMUNITY): Payer: Self-pay

## 2022-05-18 ENCOUNTER — Telehealth (HOSPITAL_COMMUNITY): Payer: Self-pay | Admitting: Cardiology

## 2022-05-18 MED ORDER — METHOTREXATE SODIUM 2.5 MG PO TABS: 2.5000 mg | ORAL_TABLET | ORAL | 0 refills | Status: DC

## 2022-05-18 NOTE — Telephone Encounter (Signed)
Pt stated he only has 12 pills, relayed message to pt on chart, he is requested a call back from [facility], please advise

## 2022-05-22 NOTE — Telephone Encounter (Signed)
Emer Colleran,RN aware.  

## 2022-05-25 ENCOUNTER — Encounter: Payer: Self-pay | Admitting: Pulmonary Disease

## 2022-06-06 DIAGNOSIS — D8685 Sarcoid myocarditis: Secondary | ICD-10-CM | POA: Diagnosis not present

## 2022-06-10 NOTE — Progress Notes (Signed)
Office Visit Note  Patient: Steve Wang             Date of Birth: 1971/03/26           MRN: [id number]             PCP: Birdie Sons, MD Referring: Larey Dresser, MD Visit Date: 06/11/2022   Subjective:   History of Present Illness: Steve Wang is a 51 y.o. male here for management of sarcoidosis. He was originally diagnosed by bronchoscopy and possibly biopsy (not available for review to me at this time) in 2012 with initial treatment course of methotrexate and prednisone for a year but was not on long term immunosuppression and without disease activity. More recently concern due to decreased in EF under close monitoring with heart failure clinic possibly thought reltaed to COVID-19 infection and inflammation. Cardiac MRI in 2022 unremarkable for inflammatory changes as was PET/CT surveillance in March. However he continued to developed symptomatic disease this improved on methotrexate and prednisone but had return of joint pains with prednisone tapering. Repeat PET/CT obtained 11/15 now showing evidence of increased metabolic uptake in both ventricular wall as mediastinal lymph nodes concerning for active sarcoidosis.  His symptoms are not very noticeable at baseline he can climb about 3 flights of stairs without resting but would be winded by the end of it and recover after several minutes.  He has not having any cough or chest pain.  He has noticed episodic skin rash usually breaking out on his scalp in the center of the chest and seems to correspond with when he has more exertional dyspnea or mild coughing.  He has some joint pain in hands and feet in the morning she was improved after 5 to 10 minutes.  He does not see any visible swelling or erythema.  He has never had any eye inflammation or vision change during this time. Recently this year he was resumed on methotrexate treatment but he did not tolerate this well felt a lot of brain fog and general malaise symptoms.  He never noticed  much difference in symptoms with the methotrexate and so far is only seen a difference when prescribed steroids.  He stopped taking the methotrexate since about 5 weeks ago due to the side effects and not seeing a difference.  He is currently on a prolonged prednisone taper based on findings for active cardiac sarcoidosis currently 30 mg daily plan to taper down and maintain on 10 mg dose until February follow-up repeat scan.  Imaging reviewed 06/06/22 PET/CT Cardiac PET/CT metabolic study with [id number] FDG and scanned on PET Discovery MI: 1. There are several segments ( 7 out of 17) with hypermetabolic activity suggestive of an active inflammatory process in the left ventricular myocardium. 2. Approximately 44% of the left ventricle is involved with predominantly mild/moderate/severe hypermetabolic activity. 3. There is evidence of abnormal metabolism in the right ventricle. 4. Overall findings are consistent with relapse of disease (Sarcoidosis).  Perfusion/Function.  Gated cardiac PET/CT rest myocardial perfusion study with [id number] demonstrates: 1. Normal myocardial perfusion. 2. Dilated left ventricle with normal left ventricular systolic function.  Non-diagnostic CT obtained for attenuation correction:  1. There are no discernible coronary artery calcifications. 2. There are extensive areas of extracardiac hypermetabolic activity noted on the limited field of view, specifically bilateral hilar, mediastinal lymph nodes, and extensive lung opacities, and retroperitoneal lymphnodes. Those findings were not present on most recent exam.   Activities of Daily Living:  Patient reports morning  stiffness for 5-10 minutes.   Patient Denies nocturnal pain.  Difficulty dressing/grooming: Reports Difficulty climbing stairs: Denies Difficulty getting out of chair: Denies Difficulty using hands for taps, buttons, cutlery, and/or writing: Denies  Review of Systems  Constitutional:  Negative for fatigue.   HENT:  Positive for mouth dryness. Negative for mouth sores.   Eyes:  Negative for dryness.  Respiratory:  Negative for shortness of breath.   Cardiovascular:  Negative for chest pain and palpitations.  Gastrointestinal:  Negative for blood in stool, constipation and diarrhea.  Endocrine: Negative for increased urination.  Genitourinary:  Negative for involuntary urination.  Musculoskeletal:  Positive for joint pain, joint pain, myalgias, morning stiffness, muscle tenderness and myalgias. Negative for gait problem, joint swelling and muscle weakness.  Skin:  Negative for color change, rash and sensitivity to sunlight.  Allergic/Immunologic: Negative for susceptible to infections.  Neurological:  Negative for dizziness and headaches.  Hematological:  Negative for swollen glands.  Psychiatric/Behavioral:  Negative for depressed mood and sleep disturbance. The patient is not nervous/anxious.     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 ([place]) 11/06/2021   Former smoker 11/06/2021   Shortness of breath 11/06/2021   Right bundle branch block 09/10/2018   GERD (gastroesophageal reflux disease) 06/25/2018   Sarcoidosis of lung ([place]) 06/25/2018   Chronic right shoulder pain 06/25/2018   History of migraine 06/25/2018   History of kidney stones 06/25/2018    Past Medical History:  Diagnosis Date   COPD (chronic obstructive pulmonary disease) ([place])    Emphysema of lung (HCC)    GERD (gastroesophageal reflux disease)    HFrEF (heart failure with reduced ejection fraction) ([place])    a. 09/2018 Echo: EF 55-60%; b. 08/2020 Echo: EF Q000111Q, grade 2 diastolic dysfunction; c. Q000111Q cMRI: EF 36%, mild to mod dil LV. Mod red RV fxn. No LGE/evidence of sarcoid. No signif valvular dzs   NICM (nonischemic cardiomyopathy)  ([place])    a. 09/2018 Echo: EF 55-60%; b. 08/2020 Echo: EF 45-50%; c. 10/2020 cMRI: EF 36%, mild to mod dil LV. Mod red RV fxn. No LGE/evidence of sarcoid. No signif valvular dzs; d. 12/2020 Cor CTA: Ca2+= 0. Nl cors.   Pulmonary nodule    a. 12/2020 CT chest: 1.3cm posterior basal LUL nodule and 27mm LLL nodule - unchanged.   RBBB    Sarcoidosis    a. Dx 10/2020 in [place] on steroids/methotrexate.    Family History  Problem Relation Age of Onset   COPD Mother    Colon cancer Neg Hx    Pancreatic cancer Neg Hx    Liver cancer Neg Hx    Esophageal cancer Neg Hx    Stomach cancer Neg Hx    Past Surgical History:  Procedure Laterality Date   BRONCHOSCOPY  11/09/2010   [facility]   COLONOSCOPY     UPPER GI ENDOSCOPY     Social History   Social History Narrative   Not on file   Immunization History  Administered Date(s) Administered   Influenza,inj,Quad PF,6+ Mos 09/08/2012   Influenza-Unspecified 05/07/2011   Pneumococcal Polysaccharide-23 06/25/2018   Tdap 03/24/2013, 06/25/2018     Objective: Vital Signs: BP 122/78 (BP Location: Right Arm, Patient Position: Sitting, Cuff Size: Normal)   Pulse 60   Resp 17  Ht 5\' 6"  (1.676 m)   Wt 175 lb 9.6 oz (79.7 kg)   BMI 28.34 kg/m    Physical Exam HENT:     Mouth/Throat:     Mouth: Mucous membranes are moist.     Pharynx: Oropharynx is clear.  Eyes:     Conjunctiva/sclera: Conjunctivae normal.  Cardiovascular:     Rate and Rhythm: Normal rate and regular rhythm.  Pulmonary:     Effort: Pulmonary effort is normal.     Breath sounds: Normal breath sounds.  Musculoskeletal:     Right lower leg: No edema.     Left lower leg: No edema.  Lymphadenopathy:     Cervical: No cervical adenopathy.  Skin:    Findings: No rash.  Neurological:     Mental Status: He is alert.  Psychiatric:        Mood and Affect: Mood normal.      Musculoskeletal Exam:  Shoulders full ROM no tenderness or swelling Elbows full  ROM no tenderness or swelling Wrists full ROM no tenderness or swelling Fingers full ROM no tenderness or swelling, heberdon's nodes of DIP joints, mild tenderness in 3rd PIP Knees full ROM no tenderness or swelling   Investigation: No additional findings.  Imaging: No results found.  Recent Labs: Lab Results  Component Value Date   WBC 9.6 10/11/2021   HGB 16.1 10/11/2021   PLT 192 10/11/2021   NA 138 04/17/2022   K 4.3 04/17/2022   CL 107 04/17/2022   CO2 25 04/17/2022   GLUCOSE 103 (H) 04/17/2022   BUN 16 04/17/2022   CREATININE 0.86 04/17/2022   BILITOT 0.9 04/17/2022   ALKPHOS 55 04/17/2022   AST 32 04/17/2022   ALT 41 04/17/2022   PROT 8.2 (H) 04/17/2022   ALBUMIN 4.6 04/17/2022   CALCIUM 9.7 04/17/2022   GFRAA 116 06/25/2018    Speciality Comments: No specialty comments available.  Procedures:  No procedures performed Allergies: Patient has no known allergies.   Assessment / Plan:     Visit Diagnoses: Cardiac sarcoidosis Sarcoidosis of lung (HCC)  Original onset with definite lung involvement achieved remission after initial methotrexate and prednisone treatment now relapsing disease with pulmonary and cardiac involvement.  I am not sure if the reported skin rash is definitely related since it is mostly clear right now on steroids again, but suspect cutaneous disease as well.  I recommend we proceed with starting Humira for treatment due to lack of response with resuming methotrexate and I do not think prolonged steroid monotherapy is appropriate with relapsing disease with major organ involvement.  High risk medication use - Plan: CBC with Differential/Platelet, COMPLETE METABOLIC PANEL WITH GFR, QuantiFERON-TB Gold Plus, Hepatitis B core antibody, IgM, Hepatitis B surface antigen, Hepatitis C antibody  Checking baseline labs anticipating new start of Humira with CBC, CMP, QuantiFERON, and hepatitis screening.  He has extensive recent chest imaging for his  disease workup.  Discussed risks of medications including infections, injection site reactions, long-term risk of lymphoma nonmelanoma skin cancer, and cytopenias with requirement for regular medication monitoring.  Most recent cardiac imaging with a grossly normal ejection fraction and no symptoms of decompensated heart failure.  Generalized osteoarthritis  Does have osteoarthritis of multiple areas DIP joint involvement of both hands.  I think this is most likely not coming from an inflammatory cause right now but difficult to say as he is currently on 30 mg prednisone that would likely suppress any synovitis.  Orders: Orders Placed This Encounter  Procedures   CBC with Differential/Platelet   COMPLETE METABOLIC PANEL WITH GFR   QuantiFERON-TB Gold Plus   Hepatitis B core antibody, IgM   Hepatitis B surface antigen   Hepatitis C antibody   No orders of the defined types were placed in this encounter.    Follow-Up Instructions: Return in about 3 months (around 09/11/2022) for New pt sarcoidosis ?Humira start f/u 76mos.   Collier Salina, MD  Note - This record has been created using [employer].  Chart creation errors have been sought, but may not always  have been located. Such creation errors do not reflect on  the standard of medical care.

## 2022-06-11 ENCOUNTER — Ambulatory Visit: Payer: BC Managed Care – PPO | Attending: Internal Medicine | Admitting: Internal Medicine

## 2022-06-11 ENCOUNTER — Telehealth: Payer: Self-pay | Admitting: Pharmacist

## 2022-06-11 ENCOUNTER — Encounter: Payer: Self-pay | Admitting: Internal Medicine

## 2022-06-11 VITALS — BP 122/78 | HR 60 | Resp 17 | Ht 66.0 in | Wt 175.6 lb

## 2022-06-11 DIAGNOSIS — M159 Polyosteoarthritis, unspecified: Secondary | ICD-10-CM | POA: Diagnosis not present

## 2022-06-11 DIAGNOSIS — D860 Sarcoidosis of lung: Secondary | ICD-10-CM | POA: Diagnosis not present

## 2022-06-11 DIAGNOSIS — D8685 Sarcoid myocarditis: Secondary | ICD-10-CM | POA: Diagnosis not present

## 2022-06-11 DIAGNOSIS — Z79899 Other long term (current) drug therapy: Secondary | ICD-10-CM | POA: Diagnosis not present

## 2022-06-11 NOTE — Telephone Encounter (Signed)
Please start HUMIRA SQ BIV.  Dose: 40mg  SQ every 2 weeks  Dx: cardiac sarcoidosis (D86.85), pulmonary sarcoidosis (D86.0)  Current regimen: MTX + prednisone 30mg   May be family planning now and no longer a candidate for MTX  Once approved, patient will need to be enrolled into copay card  01-26-1991, PharmD, MPH, BCPS, CPP Clinical Pharmacist (Rheumatology and Pulmonology)

## 2022-06-11 NOTE — Progress Notes (Signed)
Pharmacy Note Subjective: Patient presents today to [facility] for follow up office visit. Patient seen by the pharmacist for counseling on Humira for sarcoidosis.  Prior therapy includes: MTX. Currently on prednisone 30mg  daily. Per review of cardiac notes, he is potentially family planning therefore not a candidate for MTX anymore.  Diagnosis of heart failure: No  Objective:  CBC    Component Value Date/Time   WBC 9.6 10/11/2021 1028   RBC 5.01 10/11/2021 1028   HGB 16.1 10/11/2021 1028   HCT 47.2 10/11/2021 1028   PLT 192 10/11/2021 1028   MCV 94.2 10/11/2021 1028   MCH 32.1 10/11/2021 1028   MCHC 34.1 10/11/2021 1028   RDW 13.5 10/11/2021 1028   LYMPHSABS 2.2 10/11/2021 1028   MONOABS 0.9 10/11/2021 1028   EOSABS 0.2 10/11/2021 1028   BASOSABS 0.1 10/11/2021 1028     CMP     Component Value Date/Time   NA 138 04/17/2022 1048   NA 140 06/25/2018 0322   K 4.3 04/17/2022 1048   CL 107 04/17/2022 1048   CO2 25 04/17/2022 1048   GLUCOSE 103 (H) 04/17/2022 1048   BUN 16 04/17/2022 1048   BUN 10 06/25/2018 0322   CREATININE 0.86 04/17/2022 1048   CALCIUM 9.7 04/17/2022 1048   PROT 8.2 (H) 04/17/2022 1048   PROT 7.3 06/25/2018 0322   ALBUMIN 4.6 04/17/2022 1048   ALBUMIN 4.5 06/25/2018 0322   AST 32 04/17/2022 1048   ALT 41 04/17/2022 1048   ALKPHOS 55 04/17/2022 1048   BILITOT 0.9 04/17/2022 1048   BILITOT 0.5 06/25/2018 0322   GFRNONAA >60 04/17/2022 1048   GFRAA 116 06/25/2018 0322      Baseline Immunosuppressant Therapy Labs TB GOLD   Hepatitis Panel   HIV Lab Results  Component Value Date   HIV Non Reactive 06/25/2018   Immunoglobulins   SPEP    Latest Ref Rng & Units 04/17/2022   10:48 AM  Serum Protein Electrophoresis  Total Protein 6.5 - 8.1 g/dL 8.2    04/19/2022 No results found for: "G6PDH" TPMT No results found for: "TPMT"   Chest x-ray: 10/31/2021 - Chronic imaging stigmata compatible with reported clinical history of  sarcoidosis, as above, without radiographic evidence of acute cardiopulmonary disease.  Assessment/Plan:  Counseled patient that Humira is a TNF blocking agent.  Counseled patient on purpose, proper use, and adverse effects of Humira.  Reviewed the most common adverse effects including infections, headache, and injection site reactions. Discussed that there is the possibility of an increased risk of malignancy including non-melanoma skin cancer but it is not well understood if this increased risk is due to the medication or the disease state.  Advised patient to get yearly dermatology exams due to risk of skin cancer. Counseled patient that Humira should be held prior to scheduled surgery.  Counseled patient to avoid live vaccines while on Humira.  Recommend annual influenza, PCV 15 or PCV20 or Pneumovax 23, and Shingrix as indicated.  Reviewed the importance of regular labs while on Humira therapy. Will monitor CBC and CMP 1 month after starting and then every 3 months routinely thereafter. Will monitor TB gold annually. Standing orders placed. Provided patient with medication education material and answered all questions.  Patient consented to Humira.  Will upload consent into the media tab.  Reviewed storage instructions of Humira.  Advised initial injection must be administered in office.  Patient verbalized understanding.   Dermatology referral will be placed at new start visit.  Dose will be  for cardiac/pulmonary sarcoidosis .  Prescription pending lab results and/or insurance approval.  Chesley Mires, PharmD, MPH, BCPS, CPP Clinical Pharmacist (Rheumatology and Pulmonology)

## 2022-06-12 NOTE — Telephone Encounter (Signed)
SAVED a Prior Authorization request to [facility] for HUMIRA via CoverMyMeds. Submission is pending signed OV note from 06/11/22  Key: [id number]  Chesley Mires, PharmD, MPH, BCPS, CPP Clinical Pharmacist (Rheumatology and Pulmonology)

## 2022-06-17 LAB — CBC WITH DIFFERENTIAL/PLATELET
Absolute Monocytes: 580 cells/uL (ref 200–950)
Basophils Absolute: 19 cells/uL (ref 0–200)
Basophils Relative: 0.3 %
Eosinophils Absolute: 158 cells/uL (ref 15–500)
Eosinophils Relative: 2.5 %
HCT: 45.7 % (ref 38.5–50.0)
Hemoglobin: 15.4 g/dL (ref 13.2–17.1)
Lymphs Abs: 844 cells/uL — ABNORMAL LOW (ref 850–3900)
MCH: 30.7 pg (ref 27.0–33.0)
MCHC: 33.7 g/dL (ref 32.0–36.0)
MCV: 91.2 fL (ref 80.0–100.0)
MPV: 12.3 fL (ref 7.5–12.5)
Monocytes Relative: 9.2 %
Neutro Abs: 4700 cells/uL (ref 1500–7800)
Neutrophils Relative %: 74.6 %
Platelets: 244 10*3/uL (ref 140–400)
RBC: 5.01 10*6/uL (ref 4.20–5.80)
RDW: 11.7 % (ref 11.0–15.0)
Total Lymphocyte: 13.4 %
WBC: 6.3 10*3/uL (ref 3.8–10.8)

## 2022-06-17 LAB — COMPLETE METABOLIC PANEL WITH GFR
AG Ratio: 1.5 (calc) (ref 1.0–2.5)
ALT: 23 U/L (ref 9–46)
AST: 16 U/L (ref 10–35)
Albumin: 4.5 g/dL (ref 3.6–5.1)
Alkaline phosphatase (APISO): 52 U/L (ref 35–144)
BUN: 15 mg/dL (ref 7–25)
CO2: 29 mmol/L (ref 20–32)
Calcium: 9.5 mg/dL (ref 8.6–10.3)
Chloride: 103 mmol/L (ref 98–110)
Creat: 0.92 mg/dL (ref 0.70–1.30)
Globulin: 3.1 g/dL (calc) (ref 1.9–3.7)
Glucose, Bld: 101 mg/dL — ABNORMAL HIGH (ref 65–99)
Potassium: 4.8 mmol/L (ref 3.5–5.3)
Sodium: 139 mmol/L (ref 135–146)
Total Bilirubin: 0.6 mg/dL (ref 0.2–1.2)
Total Protein: 7.6 g/dL (ref 6.1–8.1)
eGFR: 101 mL/min/{1.73_m2} (ref >=60)

## 2022-06-17 LAB — HEPATITIS C ANTIBODY: Hepatitis C Ab: NONREACTIVE

## 2022-06-17 LAB — QUANTIFERON-TB GOLD PLUS
Mitogen-NIL: >10 IU/mL
NIL: 0.05 IU/mL
QuantiFERON-TB Gold Plus: NEGATIVE
TB1-NIL: 0 IU/mL
TB2-NIL: <0 IU/mL

## 2022-06-17 LAB — HEPATITIS B CORE ANTIBODY, IGM: Hep B C IgM: NONREACTIVE

## 2022-06-17 LAB — HEPATITIS B SURFACE ANTIGEN: Hepatitis B Surface Ag: NONREACTIVE

## 2022-06-19 NOTE — Telephone Encounter (Signed)
Signed now, thanks. Slightly low lymphocytes not concerning to me about proceeding.

## 2022-06-19 NOTE — Telephone Encounter (Signed)
Submitted PA to [facility] for Humira with clinicals  Key: [id number]  Chesley Mires, PharmD, MPH, BCPS, CPP Clinical Pharmacist (Rheumatology and Pulmonology)

## 2022-06-20 NOTE — Telephone Encounter (Signed)
Received a fax regarding Prior Authorization from [facility] for HUMIRA. Authorization has been DENIED because patient does not have FDA-approved diagnosis for Humira use.  Per denial letter, additional clinical information can be faxed to [phone] for reconsideration. This is not appeal. Appeal would be next step if reconsideration is denied.  Chesley Mires, PharmD, MPH, BCPS, CPP Clinical Pharmacist (Rheumatology and Pulmonology)

## 2022-06-26 ENCOUNTER — Encounter: Payer: Self-pay | Admitting: Internal Medicine

## 2022-07-09 NOTE — Telephone Encounter (Signed)
Received response for reconsideration attempt for Humira. Humira authorization remains denied.   Submitted an URGENT appeal to [facility] for HUMIRA.  Reference # [id number] Phone: [phone] Fax: [phone]  Chesley Mires, PharmD, MPH, BCPS, CPP Clinical Pharmacist (Rheumatology and Pulmonology)

## 2022-07-10 ENCOUNTER — Other Ambulatory Visit
Admission: RE | Admit: 2022-07-10 | Discharge: 2022-07-10 | Disposition: A | Discharge status: Home / Self Care | Payer: BC Managed Care – PPO | Attending: Cardiology | Admitting: Cardiology

## 2022-07-10 DIAGNOSIS — D8685 Sarcoid myocarditis: Secondary | ICD-10-CM | POA: Diagnosis not present

## 2022-07-10 LAB — BASIC METABOLIC PANEL
Anion gap: 9 (ref 5–15)
BUN: 19 mg/dL (ref 6–20)
CO2: 24 mmol/L (ref 22–32)
Calcium: 9.3 mg/dL (ref 8.9–10.3)
Chloride: 104 mmol/L (ref 98–111)
Creatinine, Ser: 0.87 mg/dL (ref 0.61–1.24)
GFR, Estimated: >60 mL/min (ref >60)
Glucose, Bld: 102 mg/dL — ABNORMAL HIGH (ref 70–99)
Potassium: 4.4 mmol/L (ref 3.5–5.1)
Sodium: 137 mmol/L (ref 135–145)

## 2022-07-19 NOTE — Telephone Encounter (Signed)
While we await appeal determination, I've entered patient into Complete Pro portal for possible bridge program enrollment as well (may be denied due to diagnosis). Submitted Patient Assistance Application to [employer] for HUMIRA along with provider portion, pt portion, PA denial., and med list. Will update patient when we receive a response.   Because we are using for non-FDA approved diagnosis, I do not expect he wll be approved thru PAP.  Fax# [phone] Phone# [phone]  If no luck from bridge program or PAP, will have to await final insurance determination unfortunately.  Chesley Mires, PharmD, MPH, BCPS, CPP Clinical Pharmacist (Rheumatology and Pulmonology)

## 2022-07-19 NOTE — Telephone Encounter (Signed)
Received letter from [facility]. Humira appeal has been downgraded from EXPEDITED to standard appeal turnaround time. May take up to 30 days  Chesley Mires, PharmD, MPH, BCPS, CPP Clinical Pharmacist (Rheumatology and Pulmonology)

## 2022-07-27 NOTE — Telephone Encounter (Signed)
Called [employer] for update on patient's Humira PAP application. Per rep, nothing is missing and wil take addition 2 business days to process.  Phone# [phone]  Also called [employer] Complete access to determine status. They state that they no longer have bridge program so would be referred to pt assistance program. Advised insurance specialist that we are still working on this.  Knox Saliva, PharmD, MPH, BCPS, CPP Clinical Pharmacist (Rheumatology and Pulmonology)

## 2022-08-01 NOTE — Telephone Encounter (Signed)
Called [employer] for an update on the application. Per rep, they are still processing the application. He gave an estimate of 24-48 hours. 

## 2022-08-01 NOTE — Telephone Encounter (Signed)
Hydroxychloroquine is helpful for usually non-serious sarcoidosis involvement such as skin and joint problems. There is little data regarding use in cardiac sarcoidosis.  It is also usually less potent than methotrexate so in Steve Wang' case I doubt it would make a large impact.  There are clinical trials for kevzara in sarcoidosis but it is not a standard/approved treatment at this time. I am not aware of any data at all for cardiac sarcoidosis.

## 2022-08-06 ENCOUNTER — Other Ambulatory Visit
Admission: RE | Admit: 2022-08-06 | Discharge: 2022-08-06 | Disposition: A | Discharge status: Home / Self Care | Payer: BC Managed Care – PPO | Attending: Cardiology | Admitting: Cardiology

## 2022-08-06 DIAGNOSIS — D8685 Sarcoid myocarditis: Secondary | ICD-10-CM | POA: Insufficient documentation

## 2022-08-06 LAB — BASIC METABOLIC PANEL
Anion gap: 9 (ref 5–15)
BUN: 20 mg/dL (ref 6–20)
CO2: 23 mmol/L (ref 22–32)
Calcium: 8.9 mg/dL (ref 8.9–10.3)
Chloride: 102 mmol/L (ref 98–111)
Creatinine, Ser: 0.91 mg/dL (ref 0.61–1.24)
GFR, Estimated: >60 mL/min (ref >60)
Glucose, Bld: 118 mg/dL — ABNORMAL HIGH (ref 70–99)
Potassium: 3.6 mmol/L (ref 3.5–5.1)
Sodium: 134 mmol/L — ABNORMAL LOW (ref 135–145)

## 2022-08-08 ENCOUNTER — Encounter: Payer: Self-pay | Admitting: *Deleted

## 2022-08-08 NOTE — Telephone Encounter (Signed)
Spoke with [employer] patient assistance rep today. They were unable to read the insurance card information on the original fax. Resent images of the insurance card to the [place] patient assistance program.  Maryan Puls, PharmD PGY-1 [facility] Resident

## 2022-08-08 NOTE — Telephone Encounter (Signed)
Received letter from [facility] stating that denial for Humira has been UPHELD. Will have to move forward with second level internal appeal. Once internal appeal process has been exhausted, only then can be pursue external appeal.  Patient has been notified via [place], PharmD, MPH, BCPS, CPP Clinical Pharmacist (Rheumatology and Pulmonology)

## 2022-08-22 NOTE — Telephone Encounter (Signed)
Submitted an URGENT second level appeal to [facility] for [place].  Reference # [id number] Appeal # [id number] Phone: [phone] Fax: [phone]  Knox Saliva, PharmD, MPH, BCPS, CPP Clinical Pharmacist (Rheumatology and Pulmonology)

## 2022-08-22 NOTE — Telephone Encounter (Signed)
Spoke with [employer] pt assistance rep. She stated they are unable to read the new insurance card that was faxed on 08/08/2022. She stated the original insurance card is more visible and they will utilize that instead.  She gave an estimate of 1-2 business days, however, she also stated they are currently behind.  Maryan Puls, PharmD PGY-1 [facility] Resident

## 2022-08-28 NOTE — Telephone Encounter (Signed)
Received letter from [facility] regarding Humira Level 2 grievance review. The request does not qualify for expedited review and may require up to 45 days from the date received (08/22/2022) for review. The Level II review panel meeting will take place via phone conference. They will contact our clinic at least 15 days prior to scheduled meeting with date and time of meeting. The right to a fair and full review is not contingent upon participation in meeting. If you or your designee plans to participate, please contact Tsosie Billing at [phone]. You may also submit additional information to my attention by fax at [phone] or in writing to:  [place], [place] [phone]  Inquiry # [id number] Appeal ID # [id number]  Knox Saliva, PharmD, MPH, BCPS, CPP Clinical Pharmacist (Rheumatology and Pulmonology)

## 2022-08-31 NOTE — Telephone Encounter (Signed)
Received fax from [place] requesting denial letter from insurance. Faxed to Elvin So, PharmD, MPH, BCPS, CPP Clinical Pharmacist (Rheumatology and Pulmonology)

## 2022-09-05 ENCOUNTER — Encounter: Payer: Self-pay | Admitting: Pulmonary Disease

## 2022-09-05 ENCOUNTER — Telehealth: Payer: Self-pay | Admitting: Pulmonary Disease

## 2022-09-05 DIAGNOSIS — D8685 Sarcoid myocarditis: Secondary | ICD-10-CM | POA: Diagnosis not present

## 2022-09-05 DIAGNOSIS — D860 Sarcoidosis of lung: Secondary | ICD-10-CM | POA: Diagnosis not present

## 2022-09-05 DIAGNOSIS — R9439 Abnormal result of other cardiovascular function study: Secondary | ICD-10-CM | POA: Diagnosis not present

## 2022-09-05 MED ORDER — STIOLTO RESPIMAT 2.5-2.5 MCG/ACT IN AERS: 2.0000 | INHALATION_SPRAY | Freq: Every day | RESPIRATORY_TRACT | 11 refills | Status: DC

## 2022-09-05 NOTE — Telephone Encounter (Signed)
Spoke with [employer] PAP representative. Confirmed that the denial letter was received from insurance. Application is currently under evaluation.  Maryan Puls, PharmD PGY-1 [facility] Resident

## 2022-09-05 NOTE — Telephone Encounter (Signed)
I spoke with the patient. He said he saw his Cardiologist today and they told him he needed to schedule a follow up appointment with Dr. Patsey Berthold. He is having increased SOB but this has been going on for months.  I scheduled him an appt for 2/27 at 10:30am.  Nothing further needed.

## 2022-09-05 NOTE — Telephone Encounter (Signed)
ATC patient-unable to leave vm due to mailbox being full.  Will call back.  

## 2022-09-11 ENCOUNTER — Ambulatory Visit: Payer: BC Managed Care – PPO | Admitting: Internal Medicine

## 2022-09-12 DIAGNOSIS — D860 Sarcoidosis of lung: Secondary | ICD-10-CM | POA: Diagnosis not present

## 2022-09-12 DIAGNOSIS — D8685 Sarcoid myocarditis: Secondary | ICD-10-CM | POA: Diagnosis not present

## 2022-09-12 DIAGNOSIS — Z7952 Long term (current) use of systemic steroids: Secondary | ICD-10-CM | POA: Diagnosis not present

## 2022-09-13 ENCOUNTER — Encounter: Payer: Self-pay | Admitting: Pulmonary Disease

## 2022-09-13 DIAGNOSIS — Z87891 Personal history of nicotine dependence: Secondary | ICD-10-CM

## 2022-09-18 ENCOUNTER — Ambulatory Visit (INDEPENDENT_AMBULATORY_CARE_PROVIDER_SITE_OTHER): Payer: BC Managed Care – PPO | Admitting: Pulmonary Disease

## 2022-09-18 ENCOUNTER — Encounter: Payer: Self-pay | Admitting: Pulmonary Disease

## 2022-09-18 VITALS — BP 110/60 | HR 71 | Temp 98.2°F | Ht 66.0 in | Wt 167.2 lb

## 2022-09-18 DIAGNOSIS — D8685 Sarcoid myocarditis: Secondary | ICD-10-CM

## 2022-09-18 DIAGNOSIS — Z87891 Personal history of nicotine dependence: Secondary | ICD-10-CM

## 2022-09-18 DIAGNOSIS — R0602 Shortness of breath: Secondary | ICD-10-CM | POA: Diagnosis not present

## 2022-09-18 DIAGNOSIS — D869 Sarcoidosis, unspecified: Secondary | ICD-10-CM

## 2022-09-18 DIAGNOSIS — J449 Chronic obstructive pulmonary disease, unspecified: Secondary | ICD-10-CM | POA: Diagnosis not present

## 2022-09-18 MED ORDER — BREZTRI AEROSPHERE 160-9-4.8 MCG/ACT IN AERO: 2.0000 | INHALATION_SPRAY | Freq: Two times a day (BID) | RESPIRATORY_TRACT | 0 refills | Status: DC

## 2022-09-18 NOTE — Progress Notes (Addendum)
Subjective:    Patient ID: Steve Wang, male    DOB: [birth year], 52 y.o.   MRN: [id number] Patient Care Team: Birdie Sons, MD as PCP - General (Family Medicine) Minna Merritts, MD as PCP - Cardiology (Cardiology) Tyler Pita, MD as Consulting Physician (Pulmonary Disease)  Requesting MD/Service: Self, primary care physician Lelon Huh, MD Date of initial consultation: 11 August 2020 Reason for consultation: History of sarcoidosis, GERD issues   PT PROFILE: 52 year old former smoker with biopsy-proven sarcoidosis previously diagnosed to managed at [facility] [place]. Patient now with dyspnea on exertion.   Chief Complaint  Patient presents with   Follow-up    SOB with exertion. No wheezing. Dry cough in the mornings and at night.    HPI Steve Wang is a 52 year old very complex former smoker with 25-pack-year history of smoking and a history of sarcoidosis first diagnosed in 2012 at the [facility] by lung biopsy.  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  He also has an element of COPD from prior smoking PFTs have consistently shown moderate obstruction.  Patient is on Stiolto for his COPD issues.  He has not shown any findings consistent with asthmatic component. He has been alternating between [facility] cardiology and [facility].  He has recently started seeing rheumatology at [facility] as he is to be switched to Humira for his cardiac sarcoid.  He has been refractory to the methotrexate and prednisone.  With regards to pulmonary sarcoid this has been quiescent his areas of activity now appear to be cardiac.   His shortness of breath has gotten worse over the last 4 months.  He also notices fatigue and arthralgias and myalgias.  He does note these symptoms most with exertion.  He does not note wheezing per se.  No recent cough or sputum production.  No hemoptysis.  Has had no fevers, chills or sweats.      DATA: 10/13/2010 CT chest: Report only, performed at [facility]: Upper lung predominant parenchymal changes, scarring and traction bronchiectasis with small peribronchial and perifissural nodules.  Enlarged bilateral hilar and mediastinal lymph nodes, findings all suggestive of sarcoidosis. 11/03/2010: ACE level 98, eosinophils elevated 8.1% 11/09/2010 Bronchoscopy specimens: [facility] [place], noncaseating granulomas right upper lobe, fungal culture negative for nocardia, Doratomyces species isolated, contaminant.  BAL 63% histiocytes, 33% lymphocytes, 2% neutrophils, 2% eosinophils.  Flow cytometry favored sarcoidosis. 09/21/2013 spirometry: Performed at [facility], FEV1 2.72 L or 76% predicted, FVC 4.42 L or 95% predicted, FEV1/FVC 61%, DLCO moderately reduced.  Consistent with mild obstructive defect (not congruent with sarcoidosis). 09/22/2018 2D echo: Performed in [place], [place], LVEF 55 to 60% mild LV dilatation, no wall motion abnormalities, otherwise normal. 08/11/2020 angiotensin-converting enzyme: 61 (reference range 14 to 82 units/L) 08/30/2020 CT chest with contrast: Perihilar traction bronchiectasis, parenchymal retraction and architectural distortion consistent with history of sarcoid.  Left ventricular dilatation.  3 mm left lower lobe nodule. 08/30/2020 echocardiogram: LVEF 45 to 50%, LV with mildly decreased function, global hypokinesis, LV cavity size severely dilated, grade II diastolic dysfunction. 10/24/2020 cardiac morphology MR: Mild to moderate dilated left ventricle, normal LV thickness, global hypokinesis, no regional wall motion abnormalities.  Reduced RV and LV systolic function LVEF [id number].  No evidence for infiltrative disease.  Negative for cardiac sarcoidosis. 12/15/2020 CT coronary morphology: Coronary calcium score of 0, no evidence of CAD.  Lung windows findings as previous. 07/06/2021 myocardial PET/CT [facility]): Abnormal  perfusion of  the myocardium, normal wall motion and normal thickening.  No coronary artery calcifications, evidence of extracardiac areas in the chest of hypermetabolism suggest sarcoidosis.  Focal increased FDG uptake in the distal esophagus query esophagitis. 08/14/2021 CT chest with contrast: Stable exam from prior, no progressive findings, architectural distortion and scarring bilaterally compatible with prior history of sarcoidosis.  Stable 3 mm left lower lobe pulmonary nodule.  Pulmonary emphysema. 10/05/2021 PFTs: FEV1 2.21 L or 64% predicted, FVC 3.86 L or 87% predicted, FEV1/FVC 57%, low normal lung volumes, diffusion capacity normal.  Consistent with moderate obstruction.   10/19/2021 PET CT myocardial [facility]): Normal myocardial perfusion, normal left ventricular systolic function, wall motion and thickening, dilated left ventricle.  No areas of extracardiac hypermetabolic activity. 03/19/2022 2D echo: LVEF 40 to 45%, left ventricle demonstrates global hypokinesis. LVEF slightly worse than 08/2020 echocardiogram. 06/06/2022 PET/CT myocardial [facility]): 1. There are several segments ( 7 out of 17) with hypermetabolic activity suggestive of an active inflammatory process in the left ventricular myocardium. 2. Approximately 44% of the left ventricle is involved with predominantly mild/moderate/severe hypermetabolic activity. 3. There is evidence of abnormal metabolism in the right ventricle.  4. Overall findings are consistent with relapse of disease (Sarcoidosis).  06/11/2022 QuantiFERON gold: NEGATIVE 09/05/2022 PET/CT myocardial ([facility]):1. There are several segments with hypermetabolic activity suggestive of an active inflammatory process in the left ventricular myocardium. Previously 7 out of 17 segments and now 9 out of 17 segments. 2. Approximately 58% of the left ventricle is involved with predominantly mild/moderate/severe hypermetabolic activity. 3. There is evidence of mild abnormal metabolism in the right  ventricle.   Review of Systems A 10 point review of systems was performed and it is as noted above otherwise negative.  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 ([place]) 11/06/2021   Former smoker 11/06/2021   Shortness of breath 11/06/2021   Right bundle branch block 09/10/2018   GERD (gastroesophageal reflux disease) 06/25/2018   Sarcoidosis of lung ([place]) 06/25/2018   Chronic right shoulder pain 06/25/2018   History of migraine 06/25/2018   History of kidney stones 06/25/2018   Social History   Tobacco Use   Smoking status: Former    Packs/day: 1.00    Years: 25.00    Total pack years: 25.00    Types: Cigarettes    Quit date: 07/23/2008    Years since quitting: 14.1    Passive exposure: Never   Smokeless tobacco: Former    Types: Chew    Quit date: 07/24/2020  Substance Use Topics   Alcohol use: Never   No Known Allergies  Current Meds  Medication Sig   Adalimumab 40 MG/0.4ML PNKT Inject into the skin.  Inject 40 mg subcutaneously every 14 (fourteen) days   benzonatate (TESSALON) 200 MG capsule Take 1 capsule (200 mg total) by mouth 3 (three) times daily as needed for cough.   co-enzyme Q-10 30 MG capsule Take 30 mg by mouth daily.   empagliflozin (JARDIANCE) 10 MG TABS tablet Take 1 tablet (10 mg total) by mouth daily before breakfast.   metoprolol succinate (TOPROL XL) 25 MG 24 hr tablet Take 0.5 tablets (12.5 mg total) by mouth daily.   predniSONE (DELTASONE) 10 MG tablet Take 20 mg by mouth daily.   spironolactone (ALDACTONE) 25 MG tablet Take 0.5 tablets by mouth once.  Tiotropium Bromide-Olodaterol (STIOLTO RESPIMAT) 2.5-2.5 MCG/ACT AERS Inhale 2 puffs into the lungs daily.   Immunization History  Administered Date(s) Administered   Influenza,inj,Quad PF,6+ Mos 09/08/2012   Influenza-Unspecified  05/07/2011   Pneumococcal Polysaccharide-23 06/25/2018   Tdap 03/24/2013, 06/25/2018       Objective:   Physical Exam BP 110/60 (BP Location: Left Arm, Cuff Size: Normal)   Pulse 71   Temp 98.2 F (36.8 C)   Ht '5\' 6"'$  (1.676 m)   Wt 167 lb 3.2 oz (75.8 kg)   SpO2 97%   BMI 26.99 kg/m   SpO2: 97 % O2 Device: None (Room air)  GENERAL: Well-developed, well-nourished gentleman, no acute distress, fully ambulatory, no conversational dyspnea. HEAD: Normocephalic, atraumatic.  EYES: Pupils equal, round, reactive to light.  No scleral icterus.  MOUTH: Natural dentition, moist.  No thrush. NECK: Supple. No thyromegaly. Trachea midline. No JVD.  No adenopathy. PULMONARY: Good air entry bilaterally.  Coarse breath sounds otherwise, no adventitious sounds.  CARDIOVASCULAR: S1 and S2. Regular rate and rhythm.  No rubs, murmurs or gallops heard. ABDOMEN: Benign. MUSCULOSKELETAL: No joint deformity, no clubbing, no edema.  Tenderness along the right shoulder. NEUROLOGIC: No overt focal deficit, no gait disturbance noted.  Speech is fluent. SKIN: Intact,warm,dry.  On limited exam no rashes. PSYCH: Mood and behavior normal.      Assessment & Plan:     ICD-10-CM   1. Sarcoidosis  D86.9 Pulmonary Function Test [facility] Only   Pulmonary sarcoid quiescent Cardiac sarcoid active Reassess pulmonary function with PFTs    2. Cardiac sarcoidosis  D86.85    Active despite methotrexate/prednisone To start Humira per [facility] cardiology QuantiFERON gold negative 05/2022    3. Shortness of breath  R06.02 Cardiopulmonary exercise test   Worsening over the last 4 to 6 months Element of fatigue as well Cardiopulmonary stress test    4. Stage 2 moderate COPD by GOLD classification ([place])  J44.9    Reassess with PFTs Trial of Breztri 2 puffs twice a day    5. Former smoker  Z87.891    He is enrolled in lung cancer screening program     Orders Placed This Encounter  Procedures   Cardiopulmonary  exercise test    Standing Status:   Future    Standing Expiration Date:   09/19/2023    Order Specific Question:   Where should this test be performed?    Answer:   [facility]   Pulmonary Function Test [facility] Only    In 3-4 weeks- per patient    Standing Status:   Future    Standing Expiration Date:   09/19/2023    Order Specific Question:   Full PFT: includes the following: basic spirometry, spirometry pre & post bronchodilator, diffusion capacity (DLCO), lung volumes    Answer:   Full PFT    Order Specific Question:   This test can only be performed at    Answer:   [facility] ordered this encounter  Medications   Budeson-Glycopyrrol-Formoterol (BREZTRI AEROSPHERE) 160-9-4.8 MCG/ACT AERO    Sig: Inhale 2 puffs into the lungs in the morning and at bedtime.    Dispense:  5.9 g    Refill:  0    Order Specific Question:   Lot Number?    Answer:   [id number]    Order Specific Question:   Expiration Date?    Answer:   08/23/2024    Order Specific Question:   Manufacturer?    Answer:  [employer] [[id number]]    Order Specific Question:   Quantity    Answer:   1   We will see the patient in follow-up in 6 to 8 weeks time call sooner should any new problems arise.  We have given him a trial.  Of Breztri today.  He understands not to use Stiolto while on the [place].  Renold Don, MD Advanced Bronchoscopy PCCM [facility]    *This note was dictated using voice recognition software/Dragon.  Despite best efforts to proofread, errors can occur which can change the meaning. Any transcriptional errors that result from this process are unintentional and may not be fully corrected at the time of dictation.

## 2022-09-18 NOTE — Patient Instructions (Addendum)
From your PET/CT at [facility]:   Cardiac PET/CT metabolic study with [id number] FDG and scanned on PET [facility]:   1. There are several segments with hypermetabolic activity suggestive of an active inflammatory process in the left       ventricular myocardium. Previously 7 out of 17 segments and now 9 out of 17 segments.   2. Approximately 58% of the left ventricle is involved with predominantly mild/moderate/severe hypermetabolic activity.   3. There is evidence of mild abnormal metabolism in the right ventricle.   You have activity that is abnormal on both the left and the right ventricle.  They usually pay more attention to the left ventricle.  Humira should help with this.  We are arranging breathing test and also a cardiopulmonary stress test.  This will give me an idea of how your lungs and your heart are working together.  I am giving you a trial of Breztri this is 2 puffs twice a day.  Let us know how this works for you.  Do not take the Stiolto while taking the [employer].  Make sure you rinse your mouth well after you use the Breztri.  I will see you in follow-up in 6 to 8 weeks time call sooner should any new problems arise.

## 2022-09-21 DIAGNOSIS — R7989 Other specified abnormal findings of blood chemistry: Secondary | ICD-10-CM | POA: Diagnosis not present

## 2022-09-21 DIAGNOSIS — R5383 Other fatigue: Secondary | ICD-10-CM | POA: Diagnosis not present

## 2022-09-21 DIAGNOSIS — W57XXXA Bitten or stung by nonvenomous insect and other nonvenomous arthropods, initial encounter: Secondary | ICD-10-CM | POA: Diagnosis not present

## 2022-09-21 DIAGNOSIS — M2550 Pain in unspecified joint: Secondary | ICD-10-CM | POA: Diagnosis not present

## 2022-09-24 NOTE — Telephone Encounter (Signed)
Received a fax from  [facility] regarding an approval for [place] patient assistance from 09/24/2022 to 09/24/2023. Approval letter sent to scan center.  Patient advised via MyChart. It appears he has see Dr. Flonnie Overman at [facility] rheum and was started on Cellcept. They also tried for Humira.  Phone number: [phone]  Knox Saliva, PharmD, MPH, BCPS, CPP Clinical Pharmacist (Rheumatology and Pulmonology)

## 2022-09-25 ENCOUNTER — Other Ambulatory Visit (HOSPITAL_COMMUNITY): Payer: Self-pay

## 2022-09-25 ENCOUNTER — Telehealth: Payer: Self-pay

## 2022-09-25 NOTE — Telephone Encounter (Signed)
This has been address and pt is scheduled for Humira new start on 09/26/2022  Knox Saliva, PharmD, MPH, BCPS, CPP Clinical Pharmacist (Rheumatology and Pulmonology)

## 2022-09-25 NOTE — Telephone Encounter (Signed)
Patient contacted the office and states he missed a call from the Pharmacy team. Patient states he believes the call was about his Humira. Patient's call back number is [phone].

## 2022-09-25 NOTE — Telephone Encounter (Signed)
Patient is scheduled for Humira new start on 09/26/2022. He confirms no active infections or antibiotic use  He states he has left VM with Dr. Flonnie Overman' office regarding f/u with Dr. Benjamine Mola for now  Knox Saliva, PharmD, MPH, BCPS, CPP Clinical Pharmacist (Rheumatology and Pulmonology)

## 2022-09-26 ENCOUNTER — Ambulatory Visit: Payer: BC Managed Care – PPO | Attending: Internal Medicine | Admitting: Pharmacist

## 2022-09-26 DIAGNOSIS — Z7189 Other specified counseling: Secondary | ICD-10-CM

## 2022-09-26 DIAGNOSIS — D8685 Sarcoid myocarditis: Secondary | ICD-10-CM

## 2022-09-26 DIAGNOSIS — D860 Sarcoidosis of lung: Secondary | ICD-10-CM

## 2022-09-26 DIAGNOSIS — Z79899 Other long term (current) drug therapy: Secondary | ICD-10-CM

## 2022-09-26 MED ORDER — ADALIMUMAB 40 MG/0.4ML ~~LOC~~ AJKT: 40.0000 mg | AUTO-INJECTOR | SUBCUTANEOUS | 0 refills | Status: DC

## 2022-09-26 NOTE — Patient Instructions (Signed)
Your next HUMIRA dose is due on 10/10/22, 10/24/22, and every 14 days thereafter  CONTINUE prednisone '20mg'$  daily for now until follow-up  [place] if you have signs or symptoms of an infection. You can resume once you feel better or back to your baseline. HOLD HUMIRA if you start antibiotics to treat an infection. HOLD HUMIRA around the time of surgery/procedures. Your surgeon will be able to provide recommendations on when to hold BEFORE and when you are cleared to [place].  Pharmacy information: Your prescription will be shipped from [facility]. Their phone number is [phone] Please call to schedule shipment this week and confirm address. They will mail your medication to your home. You want to make sure medication gets to your house by 10/10/2022 if possible  Labs are due in 1 month then every 3 months. Lab hours are from Monday to Thursday 8am-12:30pm and 1pm-5pm and Friday 8am-12pm. You do not need an appointment if you come for labs during these times.  How to manage an injection site reaction: Remember the 5 C's: COUNTER - leave on the counter at least 30 minutes but up to overnight to bring medication to room temperature. This may help prevent stinging COLD - place something cold (like an ice gel pack or cold water bottle) on the injection site just before cleansing with alcohol. This may help reduce pain CLARITIN - use Claritin (generic name is loratadine) for the first two weeks of treatment or the day of, the day before, and the day after injecting. This will help to minimize injection site reactions CORTISONE CREAM - apply if injection site is irritated and itching CALL ME - if injection site reaction is bigger than the size of your fist, looks infected, blisters, or if you develop hives

## 2022-09-26 NOTE — Progress Notes (Signed)
Pharmacy Note  Subjective:   Patient presents to clinic today to receive first dose of Humira for sarcoidosis. Patient currently takes prednisone 20 mg daily - states he has had fatigue and increased it on his own but Dr. Flonnie Overman was in agreement   He was seen by Dr. Flonnie Overman at [facility] Rheumatology on 09/12/2022 for a second opinion. Dr. Flonnie Overman also received denial for Humira and plan was to start pt on mycophenolate while auth for Humira was being processed. I spoke w patient yesterday and he states he did not actually ever start mycophenolate.  Patient running a fever or have signs/symptoms of infection? No  Patient currently on antibiotics for the treatment of infection? No  Patient have any upcoming invasive procedures/surgeries? No  Objective:  CBC and CMP updated on 09/12/22 at [facility] (in [place]) - wnl  CMP     Component Value Date/Time   NA 134 (L) 08/06/2022 1317   NA 140 06/25/2018 0322   K 3.6 08/06/2022 1317   CL 102 08/06/2022 1317   CO2 23 08/06/2022 1317   GLUCOSE 118 (H) 08/06/2022 1317   BUN 20 08/06/2022 1317   BUN 10 06/25/2018 0322   CREATININE 0.91 08/06/2022 1317   CREATININE 0.92 06/11/2022 0943   CALCIUM 8.9 08/06/2022 1317   PROT 7.6 06/11/2022 0943   PROT 7.3 06/25/2018 0322   ALBUMIN 4.6 04/17/2022 1048   ALBUMIN 4.5 06/25/2018 0322   AST 16 06/11/2022 0943   ALT 23 06/11/2022 0943   ALKPHOS 55 04/17/2022 1048   BILITOT 0.6 06/11/2022 0943   BILITOT 0.5 06/25/2018 0322   GFRNONAA >60 08/06/2022 1317   GFRAA 116 06/25/2018 0322    CBC    Component Value Date/Time   WBC 6.3 06/11/2022 0943   RBC 5.01 06/11/2022 0943   HGB 15.4 06/11/2022 0943   HCT 45.7 06/11/2022 0943   PLT 244 06/11/2022 0943   MCV 91.2 06/11/2022 0943   MCH 30.7 06/11/2022 0943   MCHC 33.7 06/11/2022 0943   RDW 11.7 06/11/2022 0943   LYMPHSABS 844 (L) 06/11/2022 0943   MONOABS 0.9 10/11/2021 1028   EOSABS 158 06/11/2022 0943   BASOSABS 19 06/11/2022 0943    Baseline  Immunosuppressant Therapy Labs TB GOLD    Latest Ref Rng & Units 06/11/2022    9:43 AM  Quantiferon TB Gold  Quantiferon TB Gold Plus NEGATIVE NEGATIVE    Hepatitis Panel    Latest Ref Rng & Units 06/11/2022    9:43 AM  Hepatitis  Hep B Surface Ag NON-REACTIVE NON-REACTIVE   Hep B IgM NON-REACTIVE NON-REACTIVE   Hep C Ab NON-REACTIVE NON-REACTIVE    HIV Lab Results  Component Value Date   HIV Non Reactive 06/25/2018   Immunoglobulins   SPEP    Latest Ref Rng & Units 06/11/2022    9:43 AM  Serum Protein Electrophoresis  Total Protein 6.1 - 8.1 g/dL 7.6    Chest x-ray: 10/31/2021  - 1. Chronic imaging stigmata compatible with reported clinical history of sarcoidosis, as above, without radiographic evidence of acute cardiopulmonary disease.  Assessment/Plan:  Counseled patient that Humira is a TNF blocking agent.  Counseled patient on purpose, proper use, and adverse effects of Humira.  Reviewed the most common adverse effects including infections, headache, and injection site reactions. Discussed that there is the possibility of an increased risk of malignancy including non-melanoma skin cancer but it is not well understood if this increased risk is due to the medication or the  disease state.  Advised patient to get yearly dermatology exams due to risk of skin cancer. Counseled patient that Humira should be held prior to scheduled surgery.  Counseled patient to avoid live vaccines while on Humira.  Recommend annual influenza, PCV 15 or PCV20 or Pneumovax 23, and Shingrix as indicated. Patient says he does not get vaccines.  Reviewed the importance of regular labs while on Humira therapy.  Will monitor CBC and CMP 1 month after starting and then every 3 months routinely thereafter. Will monitor TB gold annually. Standing orders placed.    Provided patient with medication education material and answered all questions. Reviewed storage instructions of Humira.  Patient verbalized  understanding.   Reviewed importance of holding HUMIRA with signs/symptoms of an infections, if antibiotics are prescribed to treat an active infection, and with invasive procedures.  Demonstrated proper injection technique with HUMIRA demo device  Patient able to demonstrate proper injection technique using the teach back method.  Patient self injected in the right upper thigh with:  Sample Medication: Humira '40mg'$ /0.34m pen injector NDC: [id number]: [id number]: 07/2023  Patient tolerated well.  Observed for 30 mins in office for adverse reaction and none noted.   Patient is to return in 1 month for labs and 4-6 weeks for follow-up appointment.  Standing orders placed for CBC and CMP.  [facility] while on TNF inhibitors due to risk for non-melanoma skin cancer.  HUMIRA approved through patient assistance - pt remains denied through insurance.   Rx sent to: [facility] for Humira/Rinvoq/Skyrizi: [phone]  Patient provided with pharmacy phone number and advised to call later this week to schedule shipment to home so he receives shipment by 10/10/2022  Patient will continue Humira '40mg'$  subcut every 14 days in combination with prednisone '20mg'$  daily. Will determine prednisone taper plan at f/u appt in 4 weeks.  All questions encouraged and answered.  Instructed patient to call with any further questions or concerns.  DKnox Saliva PharmD, MPH, BCPS, CPP Clinical Pharmacist (Rheumatology and Pulmonology)  09/26/2022 8:02 AM

## 2022-10-10 DIAGNOSIS — R81 Glycosuria: Secondary | ICD-10-CM | POA: Diagnosis not present

## 2022-10-10 DIAGNOSIS — D869 Sarcoidosis, unspecified: Secondary | ICD-10-CM | POA: Diagnosis not present

## 2022-10-10 DIAGNOSIS — R7989 Other specified abnormal findings of blood chemistry: Secondary | ICD-10-CM | POA: Diagnosis not present

## 2022-10-11 ENCOUNTER — Encounter: Payer: Self-pay | Admitting: Internal Medicine

## 2022-10-11 ENCOUNTER — Ambulatory Visit (HOSPITAL_COMMUNITY): Payer: BC Managed Care – PPO | Attending: Cardiology

## 2022-10-11 DIAGNOSIS — K219 Gastro-esophageal reflux disease without esophagitis: Secondary | ICD-10-CM | POA: Insufficient documentation

## 2022-10-11 DIAGNOSIS — Z87891 Personal history of nicotine dependence: Secondary | ICD-10-CM | POA: Insufficient documentation

## 2022-10-11 DIAGNOSIS — R0609 Other forms of dyspnea: Secondary | ICD-10-CM | POA: Insufficient documentation

## 2022-10-11 DIAGNOSIS — D869 Sarcoidosis, unspecified: Secondary | ICD-10-CM | POA: Diagnosis not present

## 2022-10-11 DIAGNOSIS — I428 Other cardiomyopathies: Secondary | ICD-10-CM | POA: Diagnosis not present

## 2022-10-11 DIAGNOSIS — Z7984 Long term (current) use of oral hypoglycemic drugs: Secondary | ICD-10-CM | POA: Diagnosis not present

## 2022-10-11 DIAGNOSIS — Z79899 Other long term (current) drug therapy: Secondary | ICD-10-CM | POA: Insufficient documentation

## 2022-10-11 DIAGNOSIS — I5022 Chronic systolic (congestive) heart failure: Secondary | ICD-10-CM | POA: Diagnosis not present

## 2022-10-11 DIAGNOSIS — J439 Emphysema, unspecified: Secondary | ICD-10-CM | POA: Insufficient documentation

## 2022-10-11 DIAGNOSIS — R0602 Shortness of breath: Secondary | ICD-10-CM

## 2022-10-15 ENCOUNTER — Other Ambulatory Visit: Payer: Self-pay

## 2022-10-15 DIAGNOSIS — D869 Sarcoidosis, unspecified: Secondary | ICD-10-CM

## 2022-10-15 DIAGNOSIS — J449 Chronic obstructive pulmonary disease, unspecified: Secondary | ICD-10-CM

## 2022-10-18 ENCOUNTER — Ambulatory Visit: Payer: BC Managed Care – PPO

## 2022-10-21 DIAGNOSIS — J019 Acute sinusitis, unspecified: Secondary | ICD-10-CM | POA: Diagnosis not present

## 2022-10-25 ENCOUNTER — Ambulatory Visit: Payer: BC Managed Care – PPO | Attending: Pulmonary Disease

## 2022-10-25 ENCOUNTER — Other Ambulatory Visit: Payer: Self-pay

## 2022-10-25 DIAGNOSIS — Z122 Encounter for screening for malignant neoplasm of respiratory organs: Secondary | ICD-10-CM

## 2022-10-25 DIAGNOSIS — Z87891 Personal history of nicotine dependence: Secondary | ICD-10-CM

## 2022-10-30 ENCOUNTER — Encounter: Payer: Self-pay | Admitting: Pulmonary Disease

## 2022-10-30 ENCOUNTER — Ambulatory Visit (INDEPENDENT_AMBULATORY_CARE_PROVIDER_SITE_OTHER): Payer: BC Managed Care – PPO | Admitting: Pulmonary Disease

## 2022-10-30 VITALS — BP 110/62 | HR 72 | Temp 98.0°F | Ht 66.0 in | Wt 170.8 lb

## 2022-10-30 DIAGNOSIS — D869 Sarcoidosis, unspecified: Secondary | ICD-10-CM

## 2022-10-30 DIAGNOSIS — J449 Chronic obstructive pulmonary disease, unspecified: Secondary | ICD-10-CM | POA: Diagnosis not present

## 2022-10-30 MED ORDER — LEVALBUTEROL TARTRATE 45 MCG/ACT IN AERO: 2.0000 | INHALATION_SPRAY | Freq: Four times a day (QID) | RESPIRATORY_TRACT | 12 refills | Status: DC | PRN

## 2022-10-30 NOTE — Patient Instructions (Signed)
We have provided you with the number for Pulmonary Rehab so you can call them and set up an appointment.  I sent a prescription for the Xopenex (levo albuterol) which has less of a tendency to cause palpitations.  It does not appear to be covered by your insurance but they will likely send what is called a prior authorization and we will get that processed.  I will discuss your case with the lung cancer screening program.  You are getting frequent imaging now so you likely will not need this in the foreseeable future.  We will see you in follow-up in 6 to 8 weeks time call sooner should any new problems arise.

## 2022-10-30 NOTE — Progress Notes (Signed)
Subjective:    Patient ID: Steve Wang, male    DOB: [birth year], 52 y.o.   MRN: [id number] Patient Care Team: Malva Limes, MD as PCP - General (Family Medicine) Antonieta Iba, MD as PCP - Cardiology (Cardiology) Salena Saner, MD as Consulting Physician (Pulmonary Disease)  Chief Complaint  Patient presents with   Follow-up    SOB with exertion. No wheezing or cough.   HPI Steve Wang is a 52 year old very complex former smoker with 25-pack-year history of smoking and a history of sarcoidosis first diagnosed in 2012 at the [place] [facility] [place] by lung biopsy.  The patient was last seen here on 18 September 2022.  This is a scheduled visit.  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  He has been started by [facility] rheumatology on Humira for management of his cardiac sarcoid as he has continued to show activity on methotrexate and prednisone.  He has had 3 infusions of Humira total.  He is to have cardiac PET/CT next month.  He also has an element of COPD from prior smoking PFTs have consistently shown moderate obstruction with a lesser degree concomitant restrictive defect.  Patient is on Stiolto for his COPD issues.  He has not shown any findings consistent with asthmatic component. He has been alternating between [facility] cardiology and [facility]. With regards to pulmonary sarcoid this has been quiescent his areas of activity now appears to be mostly cardiac.   His shortness of breath has gotten worse gradually.  He also notices fatigue and arthralgias and myalgias. He does note these symptoms most with exertion.  He does not note wheezing per se.  No recent cough or sputum production.  No hemoptysis.  Has had no fevers, chills or sweats.   Did not tolerate Breztri previously due to "heart racing".  He notes that Stiolto does not help him towards the end of the day.  He does not have a rescue inhaler because  albuterol does also cause tachypalpitations.  He has never tried levo albuterol.  He had a cardiopulmonary stress test performed on 11 October 2022 showing mostly pulmonary limitation due to combined obstructive/restrictive physiology.  This shows low normal pulmonary limitation due to combined restrictive/obstructive physiology.  DATA: 10/13/2010 CT chest: Report only, performed at [facility] [place]: Upper lung predominant parenchymal changes, scarring and traction bronchiectasis with small peribronchial and perifissural nodules.  Enlarged bilateral hilar and mediastinal lymph nodes, findings all suggestive of sarcoidosis. 11/03/2010: ACE level 98, eosinophils elevated 8.1% 11/09/2010 Bronchoscopy specimens: [facility] [place], noncaseating granulomas right upper lobe, fungal culture negative for nocardia, Doratomyces species isolated, contaminant.  BAL 63% histiocytes, 33% lymphocytes, 2% neutrophils, 2% eosinophils.  Flow cytometry favored sarcoidosis. 09/21/2013 spirometry: Performed at [facility], FEV1 2.72 L or 76% predicted, FVC 4.42 L or 95% predicted, FEV1/FVC 61%, DLCO moderately reduced.  Consistent with mild obstructive defect (not congruent with sarcoidosis). 09/22/2018 2D echo: Performed in [facility], [place], LVEF 55 to 60% mild LV dilatation, no wall motion abnormalities, otherwise normal. 08/11/2020 angiotensin-converting enzyme: 61 (reference range 14 to 82 units/L) 08/30/2020 CT chest with contrast: Perihilar traction bronchiectasis, parenchymal retraction and architectural distortion consistent with history of sarcoid.  Left ventricular dilatation.  3 mm left lower lobe nodule. 08/30/2020 echocardiogram: LVEF 45 to 50%, LV with mildly decreased function, global hypokinesis, LV cavity size severely dilated, grade II diastolic dysfunction. 10/24/2020 cardiac morphology MR: Mild to moderate dilated left  ventricle, normal LV thickness, global hypokinesis, no regional  wall motion abnormalities.  Reduced RV and LV systolic function LVEF 36%.  No evidence for infiltrative disease.  Negative for cardiac sarcoidosis. 12/15/2020 CT coronary morphology: Coronary calcium score of 0, no evidence of CAD.  Lung windows findings as previous. 07/06/2021 myocardial PET/CT [facility]): Abnormal perfusion of the myocardium, normal wall motion and normal thickening.  No coronary artery calcifications, evidence of extracardiac areas in the chest of hypermetabolism suggest sarcoidosis.  Focal increased FDG uptake in the distal esophagus query esophagitis. 08/14/2021 CT chest with contrast: Stable exam from prior, no progressive findings, architectural distortion and scarring bilaterally compatible with prior history of sarcoidosis.  Stable 3 mm left lower lobe pulmonary nodule.  Pulmonary emphysema. 10/05/2021 PFTs: FEV1 2.21 L or 64% predicted, FVC 3.86 L or 87% predicted, FEV1/FVC 57%, low normal lung volumes, diffusion capacity normal.  Consistent with moderate obstruction.   10/19/2021 PET CT myocardial [facility]): Normal myocardial perfusion, normal left ventricular systolic function, wall motion and thickening, dilated left ventricle.  No areas of extracardiac hypermetabolic activity. 03/19/2022 2D echo: LVEF 40 to 45%, left ventricle demonstrates global hypokinesis. LVEF slightly worse than 08/2020 echocardiogram. 06/06/2022 PET/CT myocardial [facility]): 1. There are several segments ( 7 out of 17) with hypermetabolic activity suggestive of an active inflammatory process in the left ventricular myocardium. 2. Approximately 44% of the left ventricle is involved with predominantly mild/moderate/severe hypermetabolic activity. 3. There is evidence of abnormal metabolism in the right ventricle.  4. Overall findings are consistent with relapse of disease (Sarcoidosis).  06/11/2022 QuantiFERON gold: NEGATIVE 09/05/2022 PET/CT myocardial ([facility]):1. There are several segments with hypermetabolic  activity suggestive of an active inflammatory process in the left ventricular myocardium. Previously 7 out of 17 segments and now 9 out of 17 segments. 2. Approximately 58% of the left ventricle is involved with predominantly mild/moderate/severe hypermetabolic activity. 3. There is evidence of mild abnormal metabolism in the right ventricle.  10/11/2022 cardiopulmonary stress test: Spirometry pretest FEV1 2.19 L or 64% predicted, FVC 4.80 L or 94% predicted, FEV1/FVC 54%.  MVV 94 (67%) overall patient gave very good effort.  Pulsoxymeter remained 99 to 100% for the duration of the exercise.  The exercise was performed on a cycle ergometer.  The spirometry does not show significant change from 2023 spirometric values.  The test overall showed low normal functional capacity when compared to much sedentary norms.  Patient appears predominantly pulmonary limited with mixed restrictive/obstructive lung physiology.   Review of Systems A 10 point review of systems was performed and it is as noted above otherwise negative.  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 11/06/2021   Former smoker 11/06/2021   Shortness of breath 11/06/2021   Right bundle branch block 09/10/2018   GERD (gastroesophageal reflux disease) 06/25/2018   Sarcoidosis of lung 06/25/2018   Chronic right shoulder pain 06/25/2018   History of migraine 06/25/2018   History of kidney stones 06/25/2018   Social History   Tobacco Use   Smoking status: Former    Packs/day: 1.00    Years: 25.00    Additional pack years: 0.00    Total pack years: 25.00    Types: Cigarettes    Quit date: 07/23/2008    Years since quitting: 14.2    Passive exposure: Never   Smokeless tobacco: Former  Types: Dorna Bloom    Quit date: 07/24/2020  Substance Use Topics   Alcohol use: Never    No Known Allergies  Current Meds  Medication Sig   Adalimumab 40 MG/0.4ML PNKT Inject 40 mg into the skin every 14 (fourteen) days.   co-enzyme Q-10 30 MG capsule Take 30 mg by mouth daily.   empagliflozin (JARDIANCE) 10 MG TABS tablet Take 1 tablet (10 mg total) by mouth daily before breakfast.   metoprolol succinate (TOPROL XL) 25 MG 24 hr tablet Take 0.5 tablets (12.5 mg total) by mouth daily.   predniSONE (DELTASONE) 10 MG tablet Take 20 mg by mouth daily.   spironolactone (ALDACTONE) 25 MG tablet Take 0.5 tablets by mouth once.   Tiotropium Bromide-Olodaterol (STIOLTO RESPIMAT) 2.5-2.5 MCG/ACT AERS Inhale 2 puffs into the lungs daily.   Immunization History  Administered Date(s) Administered   Influenza,inj,Quad PF,6+ Mos 09/08/2012   Influenza-Unspecified 05/07/2011   Pneumococcal Polysaccharide-23 06/25/2018   Tdap 03/24/2013, 06/25/2018       Objective:   Physical Exam BP 110/62 (BP Location: Left Arm, Cuff Size: Normal)   Pulse 72   Temp 98 F (36.7 C)   Ht 5\' 6"  (1.676 m)   Wt 170 lb 12.8 oz (77.5 kg)   SpO2 98%   BMI 27.57 kg/m   SpO2: 98 % O2 Device: None (Room air)  GENERAL: Well-developed, well-nourished gentleman, no acute distress, fully ambulatory, no conversational dyspnea. HEAD: Normocephalic, atraumatic.  EYES: Pupils equal, round, reactive to light.  No scleral icterus.  MOUTH: Natural dentition, moist.  No thrush. NECK: Supple. No thyromegaly. Trachea midline. No JVD.  No adenopathy. PULMONARY: Good air entry bilaterally. No adventitious sounds.  CARDIOVASCULAR: S1 and S2. Regular rate and rhythm.  No rubs, murmurs or gallops heard. ABDOMEN: Benign. MUSCULOSKELETAL: No joint deformity, no clubbing, no edema.  Tenderness along the right shoulder. NEUROLOGIC: No overt focal deficit, no gait disturbance noted.  Speech is fluent. SKIN: Intact,warm,dry.  On limited exam no rashes. PSYCH: Mood and behavior normal.        Assessment & Plan:      ICD-10-CM   1. Stage 2 moderate COPD by GOLD classification  J44.9    Levo albuterol for rescue Continue Stiolto 2 puffs daily Patient has been referred to pulmonary rehab    2. Sarcoidosis - Pulmonary + Cardiac  D86.9    Currently on Humira and prednisone He follows with [facility] cardiology and rheumatology Gets frequent imaging to monitor progression      Meds ordered this encounter  Medications   levalbuterol (XOPENEX HFA) 45 MCG/ACT inhaler    Sig: Inhale 2 puffs into the lungs every 6 (six) hours as needed for wheezing.    Dispense:  1 each    Refill:  12    Pt. cannot tolerate albuterol but can use levoalbuterol   We will see the patient in follow-up in 6 to 8 weeks time call sooner should any problems arise.   Gailen Shelter, MD Advanced Bronchoscopy [facility]    *This note was dictated using voice recognition software/Dragon.  Despite best efforts to proofread, errors can occur which can change the meaning. Any transcriptional errors that result from this process are unintentional and may not be fully corrected at the time of dictation.

## 2022-10-31 ENCOUNTER — Other Ambulatory Visit (HOSPITAL_COMMUNITY): Payer: Self-pay

## 2022-10-31 ENCOUNTER — Telehealth: Payer: Self-pay

## 2022-10-31 ENCOUNTER — Ambulatory Visit: Payer: BC Managed Care – PPO | Attending: Internal Medicine | Admitting: Internal Medicine

## 2022-10-31 ENCOUNTER — Encounter: Payer: Self-pay | Admitting: Internal Medicine

## 2022-10-31 VITALS — BP 107/60 | HR 70 | Resp 16 | Ht 66.0 in | Wt 172.0 lb

## 2022-10-31 DIAGNOSIS — M159 Polyosteoarthritis, unspecified: Secondary | ICD-10-CM

## 2022-10-31 DIAGNOSIS — D8685 Sarcoid myocarditis: Secondary | ICD-10-CM

## 2022-10-31 DIAGNOSIS — Z79899 Other long term (current) drug therapy: Secondary | ICD-10-CM

## 2022-10-31 DIAGNOSIS — D860 Sarcoidosis of lung: Secondary | ICD-10-CM

## 2022-10-31 LAB — CBC WITH DIFFERENTIAL/PLATELET
Eosinophils Relative: 0.8 %
Neutro Abs: 5898 cells/uL (ref 1500–7800)
Platelets: 227 10*3/uL (ref 140–400)

## 2022-10-31 MED ORDER — PREDNISONE 10 MG PO TABS: 10.0000 mg | ORAL_TABLET | Freq: Every day | ORAL | 0 refills | Status: DC

## 2022-10-31 NOTE — Progress Notes (Signed)
Office Visit Note  Patient: Steve Wang             Date of Birth: 1971/03/15           MRN: [id number]             PCP: Malva Limes, MD Referring: Malva Limes, MD Visit Date: 10/31/2022   Subjective:  Follow-up   History of Present Illness: Steve Wang is a 52 y.o. male here for follow up for sarcoidosis with cardiac and pulmonary involvement on prednisone 15 mg daily and recently started Humira 40 mg subcu q. 14 days with 3 doses taken so far.  He has not noticed any particular side effect or intolerance with taking the medication.  He continues having some shortness of breath with exertion he can make it about 2 flights of stairs before getting winded.  He followed up with Dr. Jayme Cloud about asthma/COPD inhaler treatment and pulmonary rehab. Other symptom has been extensive body aches in the mornings in the past month. This has partially improved since the onset but still has hours of morning stiffness, and pain lasting throughout the day. He thinks decreasing prednisone from 20 to 15 mg daily may have actually improved this, and saw no benefit with additional ibuprofen.    Previous HPI 06/11/22 Steve Wang is a 52 y.o. male here for management of sarcoidosis. He was originally diagnosed by bronchoscopy and possibly biopsy (not available for review to me at this time) in 2012 with initial treatment course of methotrexate and prednisone for a year but was not on long term immunosuppression and without disease activity. More recently concern due to decreased in EF under close monitoring with heart failure clinic possibly thought reltaed to COVID-19 infection and inflammation. Cardiac MRI in 2022 unremarkable for inflammatory changes as was PET/CT surveillance in March. However he continued to developed symptomatic disease this improved on methotrexate and prednisone but had return of joint pains with prednisone tapering. Repeat PET/CT obtained 11/15 now showing evidence of increased  metabolic uptake in both ventricular wall as mediastinal lymph nodes concerning for active sarcoidosis.  His symptoms are not very noticeable at baseline he can climb about 3 flights of stairs without resting but would be winded by the end of it and recover after several minutes.  He has not having any cough or chest pain.  He has noticed episodic skin rash usually breaking out on his scalp in the center of the chest and seems to correspond with when he has more exertional dyspnea or mild coughing.  He has some joint pain in hands and feet in the morning she was improved after 5 to 10 minutes.  He does not see any visible swelling or erythema.  He has never had any eye inflammation or vision change during this time. Recently this year he was resumed on methotrexate treatment but he did not tolerate this well felt a lot of brain fog and general malaise symptoms.  He never noticed much difference in symptoms with the methotrexate and so far is only seen a difference when prescribed steroids.  He stopped taking the methotrexate since about 5 weeks ago due to the side effects and not seeing a difference.  He is currently on a prolonged prednisone taper based on findings for active cardiac sarcoidosis currently 30 mg daily plan to taper down and maintain on 10 mg dose until February follow-up repeat scan.   Imaging reviewed 06/06/22 PET/CT Cardiac PET/CT metabolic study with F-18 FDG and  scanned on PET [facility]: 1. There are several segments ( 7 out of 17) with hypermetabolic activity suggestive of an active inflammatory process in the left ventricular myocardium. 2. Approximately 44% of the left ventricle is involved with predominantly mild/moderate/severe hypermetabolic activity. 3. There is evidence of abnormal metabolism in the right ventricle. 4. Overall findings are consistent with relapse of disease (Sarcoidosis).  Perfusion/Function.  Gated cardiac PET/CT rest myocardial perfusion study with  Rb-82 demonstrates: 1. Normal myocardial perfusion. 2. Dilated left ventricle with normal left ventricular systolic function.  Non-diagnostic CT obtained for attenuation correction:  1. There are no discernible coronary artery calcifications. 2. There are extensive areas of extracardiac hypermetabolic activity noted on the limited field of view, specifically bilateral hilar, mediastinal lymph nodes, and extensive lung opacities, and retroperitoneal lymphnodes. Those findings were not present on most recent exam.    Review of Systems  Constitutional:  Positive for fatigue.  HENT:  Positive for mouth dryness. Negative for mouth sores.   Eyes:  Negative for dryness.  Respiratory:  Positive for shortness of breath.   Cardiovascular:  Positive for chest pain and palpitations.  Gastrointestinal:  Negative for blood in stool, constipation and diarrhea.  Endocrine: Negative for increased urination.  Genitourinary:  Negative for involuntary urination.  Musculoskeletal:  Positive for joint pain, joint pain, myalgias, muscle weakness, morning stiffness, muscle tenderness and myalgias. Negative for gait problem and joint swelling.  Skin:  Negative for color change, rash, hair loss and sensitivity to sunlight.  Allergic/Immunologic: Negative for susceptible to infections.  Neurological:  Positive for dizziness and headaches.  Hematological:  Negative for swollen glands.  Psychiatric/Behavioral:  Positive for depressed mood. Negative for sleep disturbance. The patient is nervous/anxious.     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 (HCC) 11/06/2021   Former smoker 11/06/2021   Shortness of breath 11/06/2021   Right bundle branch block 09/10/2018   GERD (gastroesophageal reflux disease) 06/25/2018   Sarcoidosis of  lung (HCC) 06/25/2018   Chronic right shoulder pain 06/25/2018   History of migraine 06/25/2018   History of kidney stones 06/25/2018    Past Medical History:  Diagnosis Date   COPD (chronic obstructive pulmonary disease) (HCC)    Emphysema of lung (HCC)    GERD (gastroesophageal reflux disease)    HFrEF (heart failure with reduced ejection fraction) (HCC)    a. 09/2018 Echo: EF 55-60%; b. 08/2020 Echo: EF 45-50%, grade 2 diastolic dysfunction; c. 10/2020 cMRI: EF 36%, mild to mod dil LV. Mod red RV fxn. No LGE/evidence of sarcoid. No signif valvular dzs   NICM (nonischemic cardiomyopathy) (HCC)    a. 09/2018 Echo: EF 55-60%; b. 08/2020 Echo: EF 45-50%; c. 10/2020 cMRI: EF 36%, mild to mod dil LV. Mod red RV fxn. No LGE/evidence of sarcoid. No signif valvular dzs; d. 12/2020 Cor CTA: Ca2+= 0. Nl cors.   Pulmonary nodule    a. 12/2020 CT chest: 1.3cm posterior basal LUL nodule and 3mm LLL nodule - unchanged.   RBBB    Sarcoidosis    a. Dx 10/2020 in [place] - prev on steroids/methotrexate.    Family History  Problem Relation Age of Onset   COPD Mother    Colon cancer Neg Hx    Pancreatic cancer Neg Hx    Liver cancer Neg Hx  Esophageal cancer Neg Hx    Stomach cancer Neg Hx    Past Surgical History:  Procedure Laterality Date   BRONCHOSCOPY  11/09/2010   [facility] [place]   COLONOSCOPY     UPPER GI ENDOSCOPY     Social History   Social History Narrative   Not on file   Immunization History  Administered Date(s) Administered   Influenza,inj,Quad PF,6+ Mos 09/08/2012   Influenza-Unspecified 05/07/2011   Pneumococcal Polysaccharide-23 06/25/2018   Tdap 03/24/2013, 06/25/2018     Objective: Vital Signs: BP 107/60 (BP Location: Left Arm, Patient Position: Sitting, Cuff Size: Normal)   Pulse 70   Resp 16   Ht 5\' 6"  (1.676 m)   Wt 172 lb (78 kg)   BMI 27.76 kg/m    Physical Exam Eyes:     Conjunctiva/sclera: Conjunctivae normal.  Cardiovascular:     Rate and  Rhythm: Normal rate and regular rhythm.  Pulmonary:     Effort: Pulmonary effort is normal.     Breath sounds: Normal breath sounds.  Lymphadenopathy:     Cervical: No cervical adenopathy.  Skin:    General: Skin is warm and dry.     Findings: No rash.  Neurological:     Mental Status: He is alert.  Psychiatric:        Mood and Affect: Mood normal.      Musculoskeletal Exam:  Shoulders full ROM no tenderness or swelling Elbows full ROM no tenderness or swelling Wrists full ROM no tenderness or swelling Fingers full ROM no tenderness or swelling, heberdon's nodes in DIP joints Knees full ROM no tenderness or swelling    Investigation: No additional findings.  Imaging: No results found.  Recent Labs: Lab Results  Component Value Date   WBC 7.3 10/31/2022   HGB 15.7 10/31/2022   PLT 227 10/31/2022   NA 137 10/31/2022   K 4.4 10/31/2022   CL 103 10/31/2022   CO2 26 10/31/2022   GLUCOSE 102 (H) 10/31/2022   BUN 21 10/31/2022   CREATININE 0.95 10/31/2022   BILITOT 0.4 10/31/2022   ALKPHOS 55 04/17/2022   AST 15 10/31/2022   ALT 20 10/31/2022   PROT 7.4 10/31/2022   ALBUMIN 4.6 04/17/2022   CALCIUM 9.6 10/31/2022   GFRAA 116 06/25/2018   QFTBGOLDPLUS NEGATIVE 06/11/2022    Speciality Comments: Humira started 09/26/22  Procedures:  No procedures performed Allergies: Patient has no known allergies.   Assessment / Plan:     Visit Diagnoses: Cardiac sarcoidosis  Sarcoidosis of lung (HCC) - Plan: CK, Sedimentation rate, predniSONE (DELTASONE) 10 MG tablet  Now on Humira started 1 month ago clinically not much difference but tolerating the medication is so far without issue.  Plan is to recheck the CK and sed rate for inflammatory disease activity monitoring.  For the cardiac sarcoidosis ideally we will get updated nuclear imaging to be at least 3 months after the start of Humira to see the full effectiveness of the medication.  Continue Humira 40 mg subcu q. 14  days.  Also continuing prednisone can taper to 10 mg daily but not decreases any further without getting the updated cardiac assessment.  High risk medication use - Plan: CBC with Differential/Platelet, COMPLETE METABOLIC PANEL WITH GFR  Checking CBC and CMP for medication monitoring on new start of Humira.  Generalized osteoarthritis  Was having some pretty widespread joint pains and stiffness without visible peripheral joint synovitis on exam today.  Would expect there to be some continued  benefit with steroid dose as well.  Orders: Orders Placed This Encounter  Procedures   CK   Sedimentation rate   CBC with Differential/Platelet   COMPLETE METABOLIC PANEL WITH GFR   Meds ordered this encounter  Medications   predniSONE (DELTASONE) 10 MG tablet    Sig: Take 1 tablet (10 mg total) by mouth daily with breakfast.    Dispense:  90 tablet    Refill:  0     Follow-Up Instructions: Return in about 3 months (around 01/30/2023) for Sarcoidosis on ADA/GC f/u 3mos.   Fuller Plan, MD  Note - This record has been created using [employer].  Chart creation errors have been sought, but may not always  have been located. Such creation errors do not reflect on  the standard of medical care.

## 2022-10-31 NOTE — Telephone Encounter (Signed)
PA request received via CMM for Xopenex HFA 45MCG/ACT aerosol  PA has been submitted to [facility] and is pending additional questions/determination  *chart note states patient cannot tolerate albuterol hfa  Key: [id number]

## 2022-11-01 ENCOUNTER — Ambulatory Visit: Payer: BC Managed Care – PPO

## 2022-11-01 ENCOUNTER — Other Ambulatory Visit: Payer: Self-pay

## 2022-11-01 ENCOUNTER — Encounter: Payer: BC Managed Care – PPO | Attending: Pulmonary Disease

## 2022-11-01 DIAGNOSIS — J449 Chronic obstructive pulmonary disease, unspecified: Secondary | ICD-10-CM | POA: Insufficient documentation

## 2022-11-01 DIAGNOSIS — D869 Sarcoidosis, unspecified: Secondary | ICD-10-CM | POA: Insufficient documentation

## 2022-11-01 LAB — CBC WITH DIFFERENTIAL/PLATELET
Absolute Monocytes: 329 cells/uL (ref 200–950)
Basophils Absolute: 37 cells/uL (ref 0–200)
Basophils Relative: 0.5 %
Eosinophils Absolute: 58 cells/uL (ref 15–500)
HCT: 47.4 % (ref 38.5–50.0)
Hemoglobin: 15.7 g/dL (ref 13.2–17.1)
Lymphs Abs: 978 cells/uL (ref 850–3900)
MCH: 30.2 pg (ref 27.0–33.0)
MCHC: 33.1 g/dL (ref 32.0–36.0)
MCV: 91.2 fL (ref 80.0–100.0)
MPV: 11.1 fL (ref 7.5–12.5)
Monocytes Relative: 4.5 %
Neutrophils Relative %: 80.8 %
RBC: 5.2 10*6/uL (ref 4.20–5.80)
RDW: 12 % (ref 11.0–15.0)
Total Lymphocyte: 13.4 %
WBC: 7.3 10*3/uL (ref 3.8–10.8)

## 2022-11-01 LAB — COMPLETE METABOLIC PANEL WITH GFR
AG Ratio: 1.5 (calc) (ref 1.0–2.5)
ALT: 20 U/L (ref 9–46)
AST: 15 U/L (ref 10–35)
Albumin: 4.4 g/dL (ref 3.6–5.1)
Alkaline phosphatase (APISO): 52 U/L (ref 35–144)
BUN: 21 mg/dL (ref 7–25)
CO2: 26 mmol/L (ref 20–32)
Calcium: 9.6 mg/dL (ref 8.6–10.3)
Chloride: 103 mmol/L (ref 98–110)
Creat: 0.95 mg/dL (ref 0.70–1.30)
Globulin: 3 g/dL (calc) (ref 1.9–3.7)
Glucose, Bld: 102 mg/dL — ABNORMAL HIGH (ref 65–99)
Potassium: 4.4 mmol/L (ref 3.5–5.3)
Sodium: 137 mmol/L (ref 135–146)
Total Bilirubin: 0.4 mg/dL (ref 0.2–1.2)
Total Protein: 7.4 g/dL (ref 6.1–8.1)
eGFR: 97 mL/min/{1.73_m2} (ref >=60)

## 2022-11-01 LAB — SEDIMENTATION RATE: Sed Rate: 2 mm/h (ref 0–20)

## 2022-11-01 LAB — CK: Total CK: 138 U/L (ref 44–196)

## 2022-11-01 NOTE — Progress Notes (Signed)
Virtual Visit completed. Patient informed on EP and RD appointment and 6 Minute walk test. Patient also informed of patient health questionnaires on My Chart. Patient Verbalizes understanding. Visit diagnosis can be found in [facility] 10/15/2022.  

## 2022-11-03 ENCOUNTER — Encounter: Payer: Self-pay | Admitting: Pulmonary Disease

## 2022-11-05 NOTE — Telephone Encounter (Signed)
Denial letter will have reasoning of determination and appeal information if needed.

## 2022-11-05 NOTE — Telephone Encounter (Signed)
Does it explain why the medication was denied  

## 2022-11-05 NOTE — Telephone Encounter (Signed)
PA has been DENIED. Denial letter has been attached in patients documents.  

## 2022-11-06 DIAGNOSIS — R0602 Shortness of breath: Secondary | ICD-10-CM | POA: Diagnosis not present

## 2022-11-08 VITALS — Ht 67.0 in | Wt 171.9 lb

## 2022-11-08 DIAGNOSIS — D869 Sarcoidosis, unspecified: Secondary | ICD-10-CM | POA: Diagnosis not present

## 2022-11-08 DIAGNOSIS — J449 Chronic obstructive pulmonary disease, unspecified: Secondary | ICD-10-CM

## 2022-11-08 NOTE — Progress Notes (Addendum)
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 (Patient not taking: Reported on 10/30/2022), Disp: 30 capsule, Rfl: 0   Budeson-Glycopyrrol-Formoterol (BREZTRI AEROSPHERE) 160-9-4.8 MCG/ACT AERO, Inhale 2 puffs into the lungs in the morning and at bedtime. (Patient not taking: Reported on 10/30/2022), Disp: 5.9 g, Rfl: 0   co-enzyme Q-10 30 MG capsule, Take 30 mg by mouth daily. (Patient not taking: Reported on 10/31/2022), Disp: , Rfl:    empagliflozin (JARDIANCE) 10 MG TABS tablet, Take 1 tablet (10 mg total) by mouth daily before breakfast., Disp: 90 tablet, Rfl: 3   levalbuterol (XOPENEX HFA) 45 MCG/ACT inhaler, Inhale 2 puffs into the lungs every 6 (six) hours as needed for wheezing., Disp: 1 each, Rfl: 12   metoprolol succinate (TOPROL XL) 25 MG 24 hr tablet, Take 0.5 tablets (12.5 mg total) by mouth daily., Disp: 45 tablet, Rfl: 3   naproxen sodium (ANAPROX DS) 550 MG tablet, Take 1 tablet (550 mg total) by mouth 2 (two) times daily with a meal. (Patient not taking: Reported on 06/11/2022), Disp: 60 tablet, Rfl: 1   predniSONE (DELTASONE) 10 MG tablet, Take 1 tablet (10 mg total) by mouth daily with breakfast., Disp: 90 tablet, Rfl: 0   sacubitril-valsartan (ENTRESTO) 24-26 MG, Take 0.5 tablets by mouth 2 (two) times daily. (Patient not taking: Reported on 06/11/2022), Disp: 90 tablet, Rfl: 3   spironolactone  (ALDACTONE) 25 MG tablet, Take 0.5 tablets by mouth once., Disp: , Rfl:    Tiotropium Bromide-Olodaterol (STIOLTO RESPIMAT) 2.5-2.5 MCG/ACT AERS, Inhale 2 puffs into the lungs daily., Disp: 4 g, Rfl: 11 No current facility-administered medications for this visit.  Facility-Administered Medications Ordered in Other Visits:    albuterol (PROVENTIL) (2.5 MG/3ML) 0.083% nebulizer solution 2.5 mg, 2.5 mg, Nebulization, Once, Salena Saner, MD  Past Medical History: Past Medical History:  Diagnosis Date   COPD (chronic obstructive pulmonary disease)    Emphysema of lung    GERD (gastroesophageal reflux disease)    HFrEF (heart failure with reduced ejection fraction)    a. 09/2018 Echo: EF 55-60%; b. 08/2020 Echo: EF 45-50%, grade 2 diastolic dysfunction; c. 10/2020 cMRI: EF 36%, mild to mod dil LV. Mod red RV fxn. No LGE/evidence of sarcoid. No signif valvular dzs   NICM (nonischemic cardiomyopathy)    a. 09/2018 Echo: EF 55-60%; b. 08/2020 Echo: EF 45-50%; c. 10/2020 cMRI: EF 36%, mild to mod dil LV. Mod red RV fxn. No LGE/evidence of sarcoid. No signif valvular dzs; d. 12/2020 Cor CTA: Ca2+= 0. Nl cors.   Pulmonary nodule    a. 12/2020 CT chest: 1.3cm posterior basal LUL nodule and 3mm LLL nodule - unchanged.   RBBB    Sarcoidosis    a. Dx 10/2020 in [place] - prev on steroids/methotrexate.    Tobacco Use: Social History   Tobacco Use  Smoking Status Former   Packs/day: 1.00   Years: 25.00  Additional pack years: 0.00   Total pack years: 25.00   Types: Cigarettes   Quit date: 07/23/2008   Years since quitting: 14.3   Passive exposure: Never  Smokeless Tobacco Former   Types: Chew   Quit date: 07/24/2020    Labs: Review Flowsheet       Latest Ref Rng & Units 06/25/2018  Labs for ITP Cardiac and Pulmonary Rehab  Cholestrol 100 - 199 mg/dL 528   LDL (calc) 0 - 99 mg/dL 84   HDL-C >41 mg/dL 57   Trlycerides 0 - 324 mg/dL 70      Pulmonary Assessment Scores:  Pulmonary  Assessment Scores     Row Name 11/08/22 1201         ADL UCSD   ADL Phase Entry     SOB Score total 11     Rest 0     Walk 0     Stairs 3     Bath 0     Dress 0     Shop 0       CAT Score   CAT Score 13       mMRC Score   mMRC Score 1              UCSD: Self-administered rating of dyspnea associated with activities of daily living (ADLs) 6-point scale (0 = "not at all" to 5 = "maximal or unable to do because of breathlessness")  Scoring Scores range from 0 to 120.  Minimally important difference is 5 units  CAT: CAT can identify the health impairment of COPD patients and is better correlated with disease progression.  CAT has a scoring range of zero to 40. The CAT score is classified into four groups of low (less than 10), medium (10 - 20), high (21-30) and very high (31-40) based on the impact level of disease on health status. A CAT score over 10 suggests significant symptoms.  A worsening CAT score could be explained by an exacerbation, poor medication adherence, poor inhaler technique, or progression of COPD or comorbid conditions.  CAT MCID is 2 points  mMRC: mMRC (Modified Medical Research Council) Dyspnea Scale is used to assess the degree of baseline functional disability in patients of respiratory disease due to dyspnea. No minimal important difference is established. A decrease in score of 1 point or greater is considered a positive change.   Pulmonary Function Assessment:  Pulmonary Function Assessment - 11/01/22 1010       Pulmonary Function Tests   FEV1% 63 %    FEV1/FVC Ratio 58      Breath   Shortness of Breath Yes;Limiting activity             Exercise Target Goals: Exercise Program Goal: Individual exercise prescription set using results from initial 6 min walk test and THRR while considering  patient's activity barriers and safety.   Exercise Prescription Goal: Initial exercise prescription builds to 30-45 minutes a day of aerobic  activity, 2-3 days per week.  Home exercise guidelines will be given to patient during program as part of exercise prescription that the participant will acknowledge.  Education: Aerobic Exercise: - Group verbal and visual presentation on the components of exercise prescription. Introduces F.I.T.T principle from ACSM for exercise prescriptions.  Reviews F.I.T.T. principles of aerobic exercise including progression. Written material given at graduation.   Education: Resistance Exercise: - Group verbal and visual presentation on the components of exercise prescription. Introduces F.I.T.T principle from ACSM for exercise prescriptions  Reviews F.I.T.T. principles of resistance exercise including progression. Written material given at graduation.    Education: Exercise & Equipment Safety: - Individual verbal instruction and demonstration of equipment use and safety with use of the equipment. Flowsheet Row Pulmonary Rehab from 11/01/2022 in [facility] Cardiac and Pulmonary Rehab  Date 11/01/22  Educator jh  Instruction Review Code 1- Verbalizes Understanding       Education: Exercise Physiology & General Exercise Guidelines: - Group verbal and written instruction with models to review the exercise physiology of the cardiovascular system and associated critical values. Provides general exercise guidelines with specific guidelines to those with heart or lung disease.    Education: Flexibility, Balance, Mind/Body Relaxation: - Group verbal and visual presentation with interactive activity on the components of exercise prescription. Introduces F.I.T.T principle from ACSM for exercise prescriptions. Reviews F.I.T.T. principles of flexibility and balance exercise training including progression. Also discusses the mind body connection.  Reviews various relaxation techniques to help reduce and manage stress (i.e. Deep breathing, progressive muscle relaxation, and visualization). Balance handout provided to take  home. Written material given at graduation.   Activity Barriers & Risk Stratification:  Activity Barriers & Cardiac Risk Stratification - 11/08/22 1303       Activity Barriers & Cardiac Risk Stratification   Activity Barriers Shortness of Breath;Other (comment)    Comments Overall joint and muscle pain             6 Minute Walk:  6 Minute Walk     Row Name 11/08/22 1253         6 Minute Walk   Phase Initial     Distance 1505 feet     Walk Time 6 minutes     # of Rest Breaks 0     MPH 2.85     METS 4.19     RPE 7     Perceived Dyspnea  0     VO2 Peak 14.67     Symptoms No     Resting HR 62 bpm     Resting BP 110/68     Resting Oxygen Saturation  96 %     Exercise Oxygen Saturation  during 6 min walk 93 %     Max Ex. HR 90 bpm     Max Ex. BP 126/70     2 Minute Post BP 114/68       Interval HR   1 Minute HR 74     2 Minute HR 80     3 Minute HR 85     4 Minute HR 87     5 Minute HR 86     6 Minute HR 90     2 Minute Post HR 61     Interval Heart Rate? Yes       Interval Oxygen   Interval Oxygen? Yes     Baseline Oxygen Saturation % 96 %     1 Minute Oxygen Saturation % 95 %     1 Minute Liters of Oxygen 0 L  RA     2 Minute Oxygen Saturation % 94 %     2 Minute Liters of Oxygen 0 L     3 Minute Oxygen Saturation % 93 %     3 Minute Liters of Oxygen 0 L     4 Minute Oxygen Saturation % 94 %     4 Minute Liters of Oxygen 0 L     5 Minute Oxygen Saturation % 95 %  5 Minute Liters of Oxygen 0 L     6 Minute Oxygen Saturation % 95 %     6 Minute Liters of Oxygen 0 L     2 Minute Post Oxygen Saturation % 96 %     2 Minute Post Liters of Oxygen 0 L             Oxygen Initial Assessment:  Oxygen Initial Assessment - 11/08/22 1200       Home Oxygen   Home Oxygen Device None    Sleep Oxygen Prescription None    Home Exercise Oxygen Prescription None    Home Resting Oxygen Prescription None      Initial 6 min Walk   Oxygen Used None       Program Oxygen Prescription   Program Oxygen Prescription None      Intervention   Short Term Goals To learn and demonstrate proper use of respiratory medications;To learn and understand importance of maintaining oxygen saturations>88%;To learn and demonstrate proper pursed lip breathing techniques or other breathing techniques. ;To learn and understand importance of monitoring SPO2 with pulse oximeter and demonstrate accurate use of the pulse oximeter.;To learn and exhibit compliance with exercise, home and travel O2 prescription    Long  Term Goals Exhibits compliance with exercise, home  and travel O2 prescription;Maintenance of O2 saturations>88%;Compliance with respiratory medication;Verbalizes importance of monitoring SPO2 with pulse oximeter and return demonstration;Exhibits proper breathing techniques, such as pursed lip breathing or other method taught during program session;Demonstrates proper use of MDI's             Oxygen Re-Evaluation:   Oxygen Discharge (Final Oxygen Re-Evaluation):   Initial Exercise Prescription:  Initial Exercise Prescription - 11/08/22 1200       Date of Initial Exercise RX and Referring Provider   Date 11/08/22      Oxygen   Maintain Oxygen Saturation 88% or higher      Treadmill   MPH 2.9    Grade 1    Minutes 15    METs 3.62      Recumbant Bike   Level 3    Watts 35    Minutes 15    METs 4      Recumbant Elliptical   Level 2.5    Minutes 15    METs 4      REL-XR   Level 3    Minutes 15    METs 4      Prescription Details   Frequency (times per week) 2    Duration Progress to 30 minutes of continuous aerobic without signs/symptoms of physical distress      Intensity   THRR 40-80% of Max Heartrate 104 - 147    Ratings of Perceived Exertion 11-13    Perceived Dyspnea 0-4      Progression   Progression Continue to progress workloads to maintain intensity without signs/symptoms of physical distress.      Resistance  Training   Training Prescription Yes    Weight 5 lb    Reps 10-15             Perform Capillary Blood Glucose checks as needed.  Exercise Prescription Changes:   Exercise Prescription Changes     Row Name 11/08/22 1200             Response to Exercise   Blood Pressure (Admit) 110/68       Blood Pressure (Exercise) 126/70       Blood Pressure (  Exit) 112/70       Heart Rate (Admit) 62 bpm       Heart Rate (Exercise) 90 bpm       Heart Rate (Exit) 61 bpm       Oxygen Saturation (Admit) 96 %       Oxygen Saturation (Exercise) 93 %       Oxygen Saturation (Exit) 96 %       Rating of Perceived Exertion (Exercise) 7       Perceived Dyspnea (Exercise) 0       Symptoms none       Comments walk test results                Exercise Comments:   Exercise Goals and Review:   Exercise Goals     Row Name 11/08/22 1301             Exercise Goals   Increase Physical Activity Yes       Intervention Develop an individualized exercise prescription for aerobic and resistive training based on initial evaluation findings, risk stratification, comorbidities and participant's personal goals.;Provide advice, education, support and counseling about physical activity/exercise needs.       Expected Outcomes Short Term: Attend rehab on a regular basis to increase amount of physical activity.;Long Term: Add in home exercise to make exercise part of routine and to increase amount of physical activity.;Long Term: Exercising regularly at least 3-5 days a week.       Increase Strength and Stamina Yes       Intervention Provide advice, education, support and counseling about physical activity/exercise needs.;Develop an individualized exercise prescription for aerobic and resistive training based on initial evaluation findings, risk stratification, comorbidities and participant's personal goals.       Expected Outcomes Short Term: Increase workloads from initial exercise prescription for  resistance, speed, and METs.;Short Term: Perform resistance training exercises routinely during rehab and add in resistance training at home;Long Term: Improve cardiorespiratory fitness, muscular endurance and strength as measured by increased METs and functional capacity ( )       Able to understand and use rate of perceived exertion (RPE) scale Yes       Intervention Provide education and explanation on how to use RPE scale       Expected Outcomes Short Term: Able to use RPE daily in rehab to express subjective intensity level;Long Term:  Able to use RPE to guide intensity level when exercising independently       Able to understand and use Dyspnea scale Yes       Intervention Provide education and explanation on how to use Dyspnea scale       Expected Outcomes Short Term: Able to use Dyspnea scale daily in rehab to express subjective sense of shortness of breath during exertion;Long Term: Able to use Dyspnea scale to guide intensity level when exercising independently       Knowledge and understanding of Target Heart Rate Range (THRR) Yes       Intervention Provide education and explanation of THRR including how the numbers were predicted and where they are located for reference       Expected Outcomes Short Term: Able to state/look up THRR;Long Term: Able to use THRR to govern intensity when exercising independently;Short Term: Able to use daily as guideline for intensity in rehab       Able to check pulse independently Yes       Intervention Provide education and demonstration on how to  check pulse in carotid and radial arteries.;Review the importance of being able to check your own pulse for safety during independent exercise       Expected Outcomes Long Term: Able to check pulse independently and accurately;Short Term: Able to explain why pulse checking is important during independent exercise       Understanding of Exercise Prescription Yes       Intervention Provide education, explanation,  and written materials on patient's individual exercise prescription       Expected Outcomes Short Term: Able to explain program exercise prescription;Long Term: Able to explain home exercise prescription to exercise independently                Exercise Goals Re-Evaluation :   Discharge Exercise Prescription (Final Exercise Prescription Changes):  Exercise Prescription Changes - 11/08/22 1200       Response to Exercise   Blood Pressure (Admit) 110/68    Blood Pressure (Exercise) 126/70    Blood Pressure (Exit) 112/70    Heart Rate (Admit) 62 bpm    Heart Rate (Exercise) 90 bpm    Heart Rate (Exit) 61 bpm    Oxygen Saturation (Admit) 96 %    Oxygen Saturation (Exercise) 93 %    Oxygen Saturation (Exit) 96 %    Rating of Perceived Exertion (Exercise) 7    Perceived Dyspnea (Exercise) 0    Symptoms none    Comments walk test results             Nutrition:  Target Goals: Understanding of nutrition guidelines, daily intake of sodium 1500mg , cholesterol 200mg , calories 30% from fat and 7% or less from saturated fats, daily to have 5 or more servings of fruits and vegetables.  Education: All About Nutrition: -Group instruction provided by verbal, written material, interactive activities, discussions, models, and posters to present general guidelines for heart healthy nutrition including fat, fiber, MyPlate, the role of sodium in heart healthy nutrition, utilization of the nutrition label, and utilization of this knowledge for meal planning. Follow up email sent as well. Written material given at graduation.   Biometrics:  Pre Biometrics - 11/08/22 1303       Pre Biometrics   Height 5\' 7"  (1.702 m)    Weight 171 lb 14.4 oz (78 kg)    Waist Circumference --   declined   Hip Circumference --   declined   BMI (Calculated) 26.92    Single Leg Stand 30 seconds              Nutrition Therapy Plan and Nutrition Goals:  Nutrition Therapy & Goals - 11/08/22 1158        Nutrition Therapy   RD appointment deferred Yes   Deferred     Intervention Plan   Intervention Prescribe, educate and counsel regarding individualized specific dietary modifications aiming towards targeted core components such as weight, hypertension, lipid management, diabetes, heart failure and other comorbidities.    Expected Outcomes Short Term Goal: Understand basic principles of dietary content, such as calories, fat, sodium, cholesterol and nutrients.;Short Term Goal: A plan has been developed with personal nutrition goals set during dietitian appointment.;Long Term Goal: Adherence to prescribed nutrition plan.             Nutrition Assessments:  MEDIFICTS Score Key: ?70 Need to make dietary changes  40-70 Heart Healthy Diet ? 40 Therapeutic Level Cholesterol Diet  Flowsheet Row Pulmonary Rehab from 11/08/2022 in [facility] Cardiac and Pulmonary Rehab  Picture Your Plate Total Score  on Admission 64      Picture Your Plate Scores: <16 Unhealthy dietary pattern with much room for improvement. 41-50 Dietary pattern unlikely to meet recommendations for good health and room for improvement. 51-60 More healthful dietary pattern, with some room for improvement.  >60 Healthy dietary pattern, although there may be some specific behaviors that could be improved.   Nutrition Goals Re-Evaluation:   Nutrition Goals Discharge (Final Nutrition Goals Re-Evaluation):   Psychosocial: Target Goals: Acknowledge presence or absence of significant depression and/or stress, maximize coping skills, provide positive support system. Participant is able to verbalize types and ability to use techniques and skills needed for reducing stress and depression.   Education: Stress, Anxiety, and Depression - Group verbal and visual presentation to define topics covered.  Reviews how body is impacted by stress, anxiety, and depression.  Also discusses healthy ways to reduce stress and to treat/manage  anxiety and depression.  Written material given at graduation.   Education: Sleep Hygiene -Provides group verbal and written instruction about how sleep can affect your health.  Define sleep hygiene, discuss sleep cycles and impact of sleep habits. Review good sleep hygiene tips.    Initial Review & Psychosocial Screening:  Initial Psych Review & Screening - 11/01/22 1011       Initial Review   Current issues with Current Depression;History of Depression;Current Stress Concerns;Current Sleep Concerns    Source of Stress Concerns Chronic Illness    Comments His health issues get him down sometimes but does not want or need to take medication for it.  He sometimes is worried to go to sleep with fear that he will not wake up with saroidosis of the heart.      Family Dynamics   Good Support System? Yes    Comments He can look to his wife for support.             Quality of Life Scores:  Scores of 19 and below usually indicate a poorer quality of life in these areas.  A difference of  2-3 points is a clinically meaningful difference.  A difference of 2-3 points in the total score of the Quality of Life Index has been associated with significant improvement in overall quality of life, self-image, physical symptoms, and general health in studies assessing change in quality of life.  PHQ-9: Review Flowsheet       11/08/2022 08/05/2018 06/25/2018  Depression screen PHQ 2/9  Decreased Interest 0 0 0  Down, Depressed, Hopeless 0 - 0  PHQ - 2 Score 0 0 0  Altered sleeping 1 1 1   Tired, decreased energy 1 1 1   Change in appetite 0 0 0  Feeling bad or failure about yourself  0 0 0  Trouble concentrating 0 0 0  Moving slowly or fidgety/restless 0 0 0  Suicidal thoughts 0 0 0  PHQ-9 Score 2 2 2   Difficult doing work/chores Not difficult at all Not difficult at all Not difficult at all   Interpretation of Total Score  Total Score Depression Severity:  1-4 = Minimal depression, 5-9 =  Mild depression, 10-14 = Moderate depression, 15-19 = Moderately severe depression, 20-27 = Severe depression   Psychosocial Evaluation and Intervention:  Psychosocial Evaluation - 11/01/22 1013       Psychosocial Evaluation & Interventions   Interventions Encouraged to exercise with the program and follow exercise prescription;Relaxation education;Stress management education    Comments His health issues get him down sometimes but does not want or  need to take medication for it.He can look to his wife for support.He sometimes is worried to go to sleep with fear that he will not wake up with saroidosis of the heart.    Expected Outcomes Short: Start [employer] to help with mood. Long: Maintain a healthy mental state    Continue Psychosocial Services  Follow up required by staff             Psychosocial Re-Evaluation:   Psychosocial Discharge (Final Psychosocial Re-Evaluation):   Education: Education Goals: Education classes will be provided on a weekly basis, covering required topics. Participant will state understanding/return demonstration of topics presented.  Learning Barriers/Preferences:  Learning Barriers/Preferences - 11/01/22 1010       Learning Barriers/Preferences   Learning Barriers None    Learning Preferences None             General Pulmonary Education Topics:  Infection Prevention: - Provides verbal and written material to individual with discussion of infection control including proper hand washing and proper equipment cleaning during exercise session. Flowsheet Row Pulmonary Rehab from 11/01/2022 in [facility] Cardiac and Pulmonary Rehab  Date 11/01/22  Educator jh  Instruction Review Code 1- Verbalizes Understanding       Falls Prevention: - Provides verbal and written material to individual with discussion of falls prevention and safety. Flowsheet Row Pulmonary Rehab from 11/01/2022 in [facility] Cardiac and Pulmonary Rehab  Date 11/01/22  Educator jh   Instruction Review Code 1- Verbalizes Understanding       Chronic Lung Disease Review: - Group verbal instruction with posters, models, PowerPoint presentations and videos,  to review new updates, new respiratory medications, new advancements in procedures and treatments. Providing information on websites and "800" numbers for continued self-education. Includes information about supplement oxygen, available portable oxygen systems, continuous and intermittent flow rates, oxygen safety, concentrators, and Medicare reimbursement for oxygen. Explanation of Pulmonary Drugs, including class, frequency, complications, importance of spacers, rinsing mouth after steroid MDI's, and proper cleaning methods for nebulizers. Review of basic lung anatomy and physiology related to function, structure, and complications of lung disease. Review of risk factors. Discussion about methods for diagnosing sleep apnea and types of masks and machines for OSA. Includes a review of the use of types of environmental controls: home humidity, furnaces, filters, dust mite/pet prevention, HEPA vacuums. Discussion about weather changes, air quality and the benefits of nasal washing. Instruction on Warning signs, infection symptoms, calling MD promptly, preventive modes, and value of vaccinations. Review of effective airway clearance, coughing and/or vibration techniques. Emphasizing that all should Create an Action Plan. Written material given at graduation. Flowsheet Row Pulmonary Rehab from 11/08/2022 in [facility]  Education need identified 11/08/22       AED/CPR: - Group verbal and written instruction with the use of models to demonstrate the basic use of the AED with the basic ABC's of resuscitation.    Anatomy and Cardiac Procedures: - Group verbal and visual presentation and models provide information about basic cardiac anatomy and function. Reviews the testing methods done to diagnose heart  disease and the outcomes of the test results. Describes the treatment choices: Medical Management, Angioplasty, or Coronary Bypass Surgery for treating various heart conditions including Myocardial Infarction, Angina, Valve Disease, and Cardiac Arrhythmias.  Written material given at graduation.   Medication Safety: - Group verbal and visual instruction to review commonly prescribed medications for heart and lung disease. Reviews the medication, class of the drug, and side effects. Includes the steps  to properly store meds and maintain the prescription regimen.  Written material given at graduation.   Other: -Provides group and verbal instruction on various topics (see comments)   Knowledge Questionnaire Score:  Knowledge Questionnaire Score - 11/08/22 1158       Knowledge Questionnaire Score   Pre Score 16/18              Core Components/Risk Factors/Patient Goals at Admission:  Personal Goals and Risk Factors at Admission - 11/08/22 1302       Core Components/Risk Factors/Patient Goals on Admission    Weight Management Yes;Weight Maintenance    Intervention Weight Management: Develop a combined nutrition and exercise program designed to reach desired caloric intake, while maintaining appropriate intake of nutrient and fiber, sodium and fats, and appropriate energy expenditure required for the weight goal.;Weight Management/Obesity: Establish reasonable short term and long term weight goals.;Weight Management: Provide education and appropriate resources to help participant work on and attain dietary goals.    Admit Weight 171 lb (77.6 kg)    Goal Weight: Short Term 171 lb (77.6 kg)    Goal Weight: Long Term 171 lb (77.6 kg)    Expected Outcomes Short Term: Continue to assess and modify interventions until short term weight is achieved;Long Term: Adherence to nutrition and physical activity/exercise program aimed toward attainment of established weight goal;Weight Maintenance:  Understanding of the daily nutrition guidelines, which includes 25-35% calories from fat, 7% or less cal from saturated fats, less than 200mg  cholesterol, less than 1.5gm of sodium, & 5 or more servings of fruits and vegetables daily;Understanding recommendations for meals to include 15-35% energy as protein, 25-35% energy from fat, 35-60% energy from carbohydrates, less than 200mg  of dietary cholesterol, 20-35 gm of total fiber daily;Understanding of distribution of calorie intake throughout the day with the consumption of 4-5 meals/snacks    Improve shortness of breath with ADL's Yes    Intervention Provide education, individualized exercise plan and daily activity instruction to help decrease symptoms of SOB with activities of daily living.    Expected Outcomes Short Term: Improve cardiorespiratory fitness to achieve a reduction of symptoms when performing ADLs;Long Term: Be able to perform more ADLs without symptoms or delay the onset of symptoms    Hypertension Yes    Intervention Provide education on lifestyle modifcations including regular physical activity/exercise, weight management, moderate sodium restriction and increased consumption of fresh fruit, vegetables, and low fat dairy, alcohol moderation, and smoking cessation.;Monitor prescription use compliance.    Expected Outcomes Short Term: Continued assessment and intervention until BP is < 140/45mm HG in hypertensive participants. < 130/87mm HG in hypertensive participants with diabetes, heart failure or chronic kidney disease.;Long Term: Maintenance of blood pressure at goal levels.             Education:Diabetes - Individual verbal and written instruction to review signs/symptoms of diabetes, desired ranges of glucose level fasting, after meals and with exercise. Acknowledge that pre and post exercise glucose checks will be done for 3 sessions at entry of program.   Know Your Numbers and Heart Failure: - Group verbal and visual  instruction to discuss disease risk factors for cardiac and pulmonary disease and treatment options.  Reviews associated critical values for Overweight/Obesity, Hypertension, Cholesterol, and Diabetes.  Discusses basics of heart failure: signs/symptoms and treatments.  Introduces Heart Failure Zone chart for action plan for heart failure.  Written material given at graduation.   Core Components/Risk Factors/Patient Goals Review:    Core Components/Risk Factors/Patient Goals  at Discharge (Final Review):    ITP Comments:  ITP Comments     Row Name 11/01/22 1009 11/08/22 1157         ITP Comments Virtual Visit completed. Patient informed on EP and RD appointment and 6 Minute walk test. Patient also informed of patient health questionnaires on My Chart. Patient Verbalizes understanding. Visit diagnosis can be found in [facility] 10/15/2022. Completed and gym orientation. Initial ITP created and sent for review to Dr. Vida Rigger, Medical Director.               Comments: Initial ITP

## 2022-11-08 NOTE — Patient Instructions (Signed)
Patient Instructions  Patient Details  Name: Steve Wang MRN: [id number] Date of Birth: [birth year] Referring Provider:  Salena Saner, MD  Below are your personal goals for exercise, nutrition, and risk factors. Our goal is to help you stay on track towards obtaining and maintaining these goals. We will be discussing your progress on these goals with you throughout the program.  Initial Exercise Prescription:  Initial Exercise Prescription - 11/08/22 1200       Date of Initial Exercise RX and Referring Provider   Date 11/08/22      Oxygen   Maintain Oxygen Saturation 88% or higher      Treadmill   MPH 2.9    Grade 1    Minutes 15    METs 3.62      Recumbant Bike   Level 3    Watts 35    Minutes 15    METs 4      Recumbant Elliptical   Level 2.5    Minutes 15    METs 4      REL-XR   Level 3    Minutes 15    METs 4      Prescription Details   Frequency (times per week) 2    Duration Progress to 30 minutes of continuous aerobic without signs/symptoms of physical distress      Intensity   THRR 40-80% of Max Heartrate 104 - 147    Ratings of Perceived Exertion 11-13    Perceived Dyspnea 0-4      Progression   Progression Continue to progress workloads to maintain intensity without signs/symptoms of physical distress.      Resistance Training   Training Prescription Yes    Weight 5 lb    Reps 10-15             Exercise Goals: Frequency: Be able to perform aerobic exercise two to three times per week in program working toward 2-5 days per week of home exercise.  Intensity: Work with a perceived exertion of 11 (fairly light) - 15 (hard) while following your exercise prescription.  We will make changes to your prescription with you as you progress through the program.   Duration: Be able to do 30 to 45 minutes of continuous aerobic exercise in addition to a 5 minute warm-up and a 5 minute cool-down routine.   Nutrition Goals: Your personal nutrition  goals will be established when you do your nutrition analysis with the dietician.  The following are general nutrition guidelines to follow: Cholesterol < /day Sodium < /day Fiber: Men over 50 yrs - 30 grams per day  Personal Goals:  Personal Goals and Risk Factors at Admission - 11/08/22 1302       Core Components/Risk Factors/Patient Goals on Admission    Weight Management Yes;Weight Maintenance    Intervention Weight Management: Develop a combined nutrition and exercise program designed to reach desired caloric intake, while maintaining appropriate intake of nutrient and fiber, sodium and fats, and appropriate energy expenditure required for the weight goal.;Weight Management/Obesity: Establish reasonable short term and long term weight goals.;Weight Management: Provide education and appropriate resources to help participant work on and attain dietary goals.    Admit Weight 171 lb (77.6 kg)    Goal Weight: Short Term 171 lb (77.6 kg)    Goal Weight: Long Term 171 lb (77.6 kg)    Expected Outcomes Short Term: Continue to assess and modify interventions until short term weight is achieved;Long Term: Adherence to nutrition  and physical activity/exercise program aimed toward attainment of established weight goal;Weight Maintenance: Understanding of the daily nutrition guidelines, which includes 25-35% calories from fat, 7% or less cal from saturated fats, less than 200mg  cholesterol, less than 1.5gm of sodium, & 5 or more servings of fruits and vegetables daily;Understanding recommendations for meals to include 15-35% energy as protein, 25-35% energy from fat, 35-60% energy from carbohydrates, less than 200mg  of dietary cholesterol, 20-35 gm of total fiber daily;Understanding of distribution of calorie intake throughout the day with the consumption of 4-5 meals/snacks    Improve shortness of breath with ADL's Yes    Intervention Provide education, individualized exercise plan and daily  activity instruction to help decrease symptoms of SOB with activities of daily living.    Expected Outcomes Short Term: Improve cardiorespiratory fitness to achieve a reduction of symptoms when performing ADLs;Long Term: Be able to perform more ADLs without symptoms or delay the onset of symptoms    Hypertension Yes    Intervention Provide education on lifestyle modifcations including regular physical activity/exercise, weight management, moderate sodium restriction and increased consumption of fresh fruit, vegetables, and low fat dairy, alcohol moderation, and smoking cessation.;Monitor prescription use compliance.    Expected Outcomes Short Term: Continued assessment and intervention until BP is < 140/17mm HG in hypertensive participants. < 130/16mm HG in hypertensive participants with diabetes, heart failure or chronic kidney disease.;Long Term: Maintenance of blood pressure at goal levels.             Tobacco Use Initial Evaluation: Social History   Tobacco Use  Smoking Status Former   Packs/day: 1.00   Years: 25.00   Additional pack years: 0.00   Total pack years: 25.00   Types: Cigarettes   Quit date: 07/23/2008   Years since quitting: 14.3   Passive exposure: Never  Smokeless Tobacco Former   Types: Chew   Quit date: 07/24/2020    Exercise Goals and Review:  Exercise Goals     Row Name 11/08/22 1301             Exercise Goals   Increase Physical Activity Yes       Intervention Develop an individualized exercise prescription for aerobic and resistive training based on initial evaluation findings, risk stratification, comorbidities and participant's personal goals.;Provide advice, education, support and counseling about physical activity/exercise needs.       Expected Outcomes Short Term: Attend rehab on a regular basis to increase amount of physical activity.;Long Term: Add in home exercise to make exercise part of routine and to increase amount of physical activity.;Long  Term: Exercising regularly at least 3-5 days a week.       Increase Strength and Stamina Yes       Intervention Provide advice, education, support and counseling about physical activity/exercise needs.;Develop an individualized exercise prescription for aerobic and resistive training based on initial evaluation findings, risk stratification, comorbidities and participant's personal goals.       Expected Outcomes Short Term: Increase workloads from initial exercise prescription for resistance, speed, and METs.;Short Term: Perform resistance training exercises routinely during rehab and add in resistance training at home;Long Term: Improve cardiorespiratory fitness, muscular endurance and strength as measured by increased METs and functional capacity ( )       Able to understand and use rate of perceived exertion (RPE) scale Yes       Intervention Provide education and explanation on how to use RPE scale       Expected Outcomes Short Term: Able to  use RPE daily in rehab to express subjective intensity level;Long Term:  Able to use RPE to guide intensity level when exercising independently       Able to understand and use Dyspnea scale Yes       Intervention Provide education and explanation on how to use Dyspnea scale       Expected Outcomes Short Term: Able to use Dyspnea scale daily in rehab to express subjective sense of shortness of breath during exertion;Long Term: Able to use Dyspnea scale to guide intensity level when exercising independently       Knowledge and understanding of Target Heart Rate Range (THRR) Yes       Intervention Provide education and explanation of THRR including how the numbers were predicted and where they are located for reference       Expected Outcomes Short Term: Able to state/look up THRR;Long Term: Able to use THRR to govern intensity when exercising independently;Short Term: Able to use daily as guideline for intensity in rehab       Able to check pulse independently  Yes       Intervention Provide education and demonstration on how to check pulse in carotid and radial arteries.;Review the importance of being able to check your own pulse for safety during independent exercise       Expected Outcomes Long Term: Able to check pulse independently and accurately;Short Term: Able to explain why pulse checking is important during independent exercise       Understanding of Exercise Prescription Yes       Intervention Provide education, explanation, and written materials on patient's individual exercise prescription       Expected Outcomes Short Term: Able to explain program exercise prescription;Long Term: Able to explain home exercise prescription to exercise independently                Copy of goals given to participant.

## 2022-11-13 ENCOUNTER — Encounter: Payer: BC Managed Care – PPO | Admitting: Physician Assistant

## 2022-11-14 ENCOUNTER — Encounter: Payer: BC Managed Care – PPO | Admitting: *Deleted

## 2022-11-14 ENCOUNTER — Ambulatory Visit: Payer: BC Managed Care – PPO

## 2022-11-14 DIAGNOSIS — J449 Chronic obstructive pulmonary disease, unspecified: Secondary | ICD-10-CM

## 2022-11-14 DIAGNOSIS — D869 Sarcoidosis, unspecified: Secondary | ICD-10-CM | POA: Diagnosis not present

## 2022-11-14 NOTE — Progress Notes (Signed)
Daily Session Note  Patient Details  Name: Steve Wang MRN: [id number] Date of Birth: 1970/09/21 Referring Provider:    Encounter Date: 11/14/2022  Check In:  Session Check In - 11/14/22 1738       Check-In   Supervising physician immediately available to respond to emergencies See telemetry face sheet for immediately available ER MD    Location [facility]-Cardiac & Pulmonary Rehab    Staff Present Susann Givens, RN Mabeline Caras, BS, ACSM CEP, Exercise Physiologist;Joseph Reino [place], [place]    Virtual Visit No    Medication changes reported     No    Fall or balance concerns reported    No    Warm-up and Cool-down Performed on first and last piece of equipment    Resistance Training Performed Yes    VAD Patient? No    PAD/SET Patient? No      Pain Assessment   Currently in Pain? No/denies                Social History   Tobacco Use  Smoking Status Former   Packs/day: 1.00   Years: 25.00   Additional pack years: 0.00   Total pack years: 25.00   Types: Cigarettes   Quit date: 07/23/2008   Years since quitting: 14.3   Passive exposure: Never  Smokeless Tobacco Former   Types: Chew   Quit date: 07/24/2020    Goals Met:  Independence with exercise equipment Exercise tolerated well No report of concerns or symptoms today Strength training completed today  Goals Unmet:  Not Applicable  Comments: First full day of exercise!  Patient was oriented to gym and equipment including functions, settings, policies, and procedures.  Patient's individual exercise prescription and treatment plan were reviewed.  All starting workloads were established based on the results of the 6 minute walk test done at initial orientation visit.  The plan for exercise progression was also introduced and progression will be customized based on patient's performance and goals.    Dr. Bethann Punches is Medical Director for [facility] Cardiac Rehabilitation.  Dr. Vida Rigger is Medical Director  for [facility] Pulmonary Rehabilitation.

## 2022-11-15 ENCOUNTER — Encounter: Payer: BC Managed Care – PPO | Admitting: *Deleted

## 2022-11-15 DIAGNOSIS — J449 Chronic obstructive pulmonary disease, unspecified: Secondary | ICD-10-CM

## 2022-11-15 DIAGNOSIS — D869 Sarcoidosis, unspecified: Secondary | ICD-10-CM | POA: Diagnosis not present

## 2022-11-15 NOTE — Progress Notes (Signed)
Daily Session Note  Patient Details  Name: Ashvik Grundman MRN: [id number] Date of Birth: Dec 02, 1970 Referring Provider:    Encounter Date: 11/15/2022  Check In:  Session Check In - 11/15/22 1809       Check-In   Supervising physician immediately available to respond to emergencies See telemetry face sheet for immediately available ER MD    Location [facility]Card[facility] Pul[facility] Re[facility]    Staff Present Cora Collum, RN, BSN, CCRP;Noah Tickle, BS, Exercise Physiologist;Kara Clinton Sawyer, MS, ACSM CEP, Exercise Physiologist;Other   [place] Bigelow HFS, Terrilyn Saver   Virtual Visit No    Medication changes reported     No    Fall or balance concerns reported    No    Warm-up and Cool-down Performed on first and last piece of equipment    Resistance Training Performed Yes    VAD Patient? No    PAD/SET Patient? No      Pain Assessment   Currently in Pain? No/denies                Social History   Tobacco Use  Smoking Status Former   Packs/day: 1.00   Years: 25.00   Additional pack years: 0.00   Total pack years: 25.00   Types: Cigarettes   Quit date: 07/23/2008   Years since quitting: 14.3   Passive exposure: Never  Smokeless Tobacco Former   Types: Chew   Quit date: 07/24/2020    Goals Met:  Proper associated with RPD/PD & O2 Sat Independence with exercise equipment Exercise tolerated well No report of concerns or symptoms today  Goals Unmet:  Not Applicable  Comments: Pt able to follow exercise prescription today without complaint.  Will continue to monitor for progression.    Dr. Bethann Punches is Medical Director for [facility] Cardiac Rehabilitation.  Dr. Vida Rigger is Medical Director for [facility] Pulmonary Rehabilitation.

## 2022-11-21 ENCOUNTER — Encounter: Payer: BC Managed Care – PPO | Attending: Pulmonary Disease | Admitting: *Deleted

## 2022-11-21 DIAGNOSIS — J449 Chronic obstructive pulmonary disease, unspecified: Secondary | ICD-10-CM | POA: Insufficient documentation

## 2022-11-21 NOTE — Progress Notes (Signed)
Daily Session Note  Patient Details  Name: Steve Wang MRN: [id number] Date of Birth: 04/18/1971 Referring Provider:    Encounter Date: 11/21/2022  Check In:  Session Check In - 11/21/22 1721       Check-In   Supervising physician immediately available to respond to emergencies See telemetry face sheet for immediately available ER MD    Location [facility] Pul[facility]    Staff Present Susann Givens, RN Mabeline Caras, BS, ACSM CEP, Exercise Physiologist;Laureen Manson Passey, BS, RRT, CPFT    Virtual Visit No    Medication changes reported     No    Fall or balance concerns reported    No    Warm-up and Cool-down Performed on first and last piece of equipment    Resistance Training Performed Yes    VAD Patient? No    PAD/SET Patient? No      Pain Assessment   Currently in Pain? No/denies                Social History   Tobacco Use  Smoking Status Former   Packs/day: 1.00   Years: 25.00   Additional pack years: 0.00   Total pack years: 25.00   Types: Cigarettes   Quit date: 07/23/2008   Years since quitting: 14.3   Passive exposure: Never  Smokeless Tobacco Former   Types: Chew   Quit date: 07/24/2020    Goals Met:  Independence with exercise equipment Exercise tolerated well No report of concerns or symptoms today Strength training completed today  Goals Unmet:  Not Applicable  Comments: Pt able to follow exercise prescription today without complaint.  Will continue to monitor for progression.    Dr. Bethann Punches is Medical Director for [facility] Cardiac Rehabilitation.  Dr. Vida Rigger is Medical Director for [facility] Pulmonary Rehabilitation.

## 2022-11-22 ENCOUNTER — Encounter: Payer: BC Managed Care – PPO | Admitting: *Deleted

## 2022-11-22 DIAGNOSIS — J449 Chronic obstructive pulmonary disease, unspecified: Secondary | ICD-10-CM

## 2022-11-22 NOTE — Progress Notes (Signed)
Daily Session Note  Patient Details  Name: Steve Wang MRN: [id number] Date of Birth: June 20, 1971 Referring Provider:    Encounter Date: 11/22/2022  Check In:  Session Check In - 11/22/22 1721       Check-In   Supervising physician immediately available to respond to emergencies See telemetry face sheet for immediately available ER MD    Location [facility]Cardiac [facility] Pulmonary Re[facility]    Staff Present Susann Givens, RN BSN;Noah Tickle, BS, Exercise Physiologist;Kara Clinton Sawyer, MS, ACSM CEP, Exercise Physiologist    Virtual Visit No    Medication changes reported     No    Fall or balance concerns reported    No    Warm-up and Cool-down Performed on first and last piece of equipment    Resistance Training Performed Yes    VAD Patient? No    PAD/SET Patient? No      Pain Assessment   Currently in Pain? No/denies                Social History   Tobacco Use  Smoking Status Former   Packs/day: 1.00   Years: 25.00   Additional pack years: 0.00   Total pack years: 25.00   Types: Cigarettes   Quit date: 07/23/2008   Years since quitting: 14.3   Passive exposure: Never  Smokeless Tobacco Former   Types: Chew   Quit date: 07/24/2020    Goals Met:  Independence with exercise equipment Exercise tolerated well No report of concerns or symptoms today Strength training completed today  Goals Unmet:  Not Applicable  Comments: Pt able to follow exercise prescription today without complaint.  Will continue to monitor for progression.    Dr. Bethann Punches is Medical Director for [facility] Cardiac Rehabilitation.  Dr. Vida Rigger is Medical Director for [facility] Pulmonary Rehabilitation.

## 2022-11-27 ENCOUNTER — Other Ambulatory Visit: Payer: Self-pay | Admitting: *Deleted

## 2022-11-27 ENCOUNTER — Encounter: Payer: Self-pay | Admitting: Internal Medicine

## 2022-11-27 DIAGNOSIS — D860 Sarcoidosis of lung: Secondary | ICD-10-CM

## 2022-11-27 DIAGNOSIS — D8685 Sarcoid myocarditis: Secondary | ICD-10-CM

## 2022-11-27 MED ORDER — ADALIMUMAB 40 MG/0.4ML ~~LOC~~ AJKT: 40.0000 mg | AUTO-INJECTOR | SUBCUTANEOUS | 0 refills | Status: DC

## 2022-11-27 NOTE — Telephone Encounter (Signed)
Last Fill: 09/26/2022  Labs: 10/31/2022 Glucose 102   TB Gold: 06/11/2202 negative   Next Visit: 02/05/2023  Last Visit: 10/31/2022  [id number] sarcoidosis   Current Dose per office note 10/31/2022: Humira 40 mg subcu q. 14 days   Okay to refill Humira?

## 2022-11-27 NOTE — Telephone Encounter (Signed)
I called patient 

## 2022-11-28 ENCOUNTER — Encounter: Payer: BC Managed Care – PPO | Admitting: *Deleted

## 2022-11-28 ENCOUNTER — Other Ambulatory Visit: Payer: Self-pay | Admitting: *Deleted

## 2022-11-28 DIAGNOSIS — J449 Chronic obstructive pulmonary disease, unspecified: Secondary | ICD-10-CM | POA: Diagnosis not present

## 2022-11-28 DIAGNOSIS — D8685 Sarcoid myocarditis: Secondary | ICD-10-CM

## 2022-11-28 DIAGNOSIS — D860 Sarcoidosis of lung: Secondary | ICD-10-CM

## 2022-11-28 MED ORDER — ADALIMUMAB 40 MG/0.4ML ~~LOC~~ AJKT
40.0000 mg | AUTO-INJECTOR | SUBCUTANEOUS | 0 refills | Status: DC
Start: 2022-11-28 — End: 2023-02-11

## 2022-11-28 NOTE — Progress Notes (Signed)
Daily Session Note  Patient Details  Name: Steve Wang MRN: [id number] Date of Birth: 03-11-1971 Referring Provider:    Encounter Date: 11/28/2022  Check In:  Session Check In - 11/28/22 1719       Check-In   Supervising physician immediately available to respond to emergencies See telemetry face sheet for immediately available ER MD    Location [facility]Card[facility] Pulmonary Rehab    Staff Present Susann Givens, RN BSN;Laureen Manson Passey, BS, RRT, CPFT;Kelly Madilyn Fireman, BS, ACSM CEP, Exercise Physiologist;Joseph Reino [place], [place]    Virtual Visit No    Medication changes reported     No    Fall or balance concerns reported    No    Warm-up and Cool-down Performed on first and last piece of equipment    Resistance Training Performed Yes    VAD Patient? No    PAD/SET Patient? No      Pain Assessment   Currently in Pain? No/denies                Social History   Tobacco Use  Smoking Status Former   Packs/day: 1.00   Years: 25.00   Additional pack years: 0.00   Total pack years: 25.00   Types: Cigarettes   Quit date: 07/23/2008   Years since quitting: 14.3   Passive exposure: Never  Smokeless Tobacco Former   Types: Chew   Quit date: 07/24/2020    Goals Met:  Independence with exercise equipment Exercise tolerated well No report of concerns or symptoms today Strength training completed today  Goals Unmet:  Not Applicable  Comments: Pt able to follow exercise prescription today without complaint.  Will continue to monitor for progression.    Dr. Bethann Punches is Medical Director for [facility] Cardiac Rehabilitation.  Dr. Vida Rigger is Medical Director for [facility] Pulmonary Rehabilitation.

## 2022-11-28 NOTE — Telephone Encounter (Signed)
I called patient, RX sent, previous RX was a no print.

## 2022-11-29 ENCOUNTER — Encounter: Payer: BC Managed Care – PPO | Admitting: *Deleted

## 2022-11-29 ENCOUNTER — Ambulatory Visit: Payer: BC Managed Care – PPO | Attending: Pulmonary Disease

## 2022-11-29 DIAGNOSIS — J449 Chronic obstructive pulmonary disease, unspecified: Secondary | ICD-10-CM

## 2022-11-29 NOTE — Progress Notes (Signed)
Daily Session Note  Patient Details  Name: Steve Wang MRN: [id number] Date of Birth: [birth year] Referring Provider:    Encounter Date: 11/29/2022  Check In:  Session Check In - 11/29/22 1718       Check-In   Supervising physician immediately available to respond to emergencies See telemetry face sheet for immediately available ER MD    Location [facility]    Staff Present Susann Givens, RN BSN;Joseph Reino Kent, RCP,RRT,BSRT;Noah Juncos, [place], Exercise Physiologist    Virtual Visit No    Medication changes reported     No    Fall or balance concerns reported    No    Warm-up and Cool-down Performed on first and last piece of equipment    Resistance Training Performed Yes    VAD Patient? No    PAD/SET Patient? No      Pain Assessment   Currently in Pain? No/denies                Social History   Tobacco Use  Smoking Status Former   Packs/day: 1.00   Years: 25.00   Additional pack years: 0.00   Total pack years: 25.00   Types: Cigarettes   Quit date: 07/23/2008   Years since quitting: 14.3   Passive exposure: Never  Smokeless Tobacco Former   Types: Chew   Quit date: 07/24/2020    Goals Met:  Independence with exercise equipment Exercise tolerated well No report of concerns or symptoms today Strength training completed today  Goals Unmet:  Not Applicable  Comments: Pt able to follow exercise prescription today without complaint.  Will continue to monitor for progression.    Dr. Bethann Punches is Medical Director for [facility].  Dr. Vida Rigger is Medical Director for [facility].

## 2022-12-05 ENCOUNTER — Encounter: Payer: BC Managed Care – PPO | Admitting: *Deleted

## 2022-12-05 ENCOUNTER — Encounter: Payer: Self-pay | Admitting: *Deleted

## 2022-12-05 DIAGNOSIS — J449 Chronic obstructive pulmonary disease, unspecified: Secondary | ICD-10-CM

## 2022-12-05 NOTE — Progress Notes (Signed)
Pulmonary Individual Treatment Plan  Patient Details  Name: Steve Wang MRN: [id number] Date of Birth: February 18, 1971 Referring Provider:    Initial Encounter Date:  Flowsheet Row Pulmonary Rehab from 11/08/2022 in [facility] Cardiac and Pulmonary Rehab  Date 11/08/22       Visit Diagnosis: Stage 2 moderate COPD by GOLD classification (HCC)  Patient's Home Medications on Admission:  Current Outpatient Medications:    Adalimumab 40 MG/0.4ML PNKT, Inject 40 mg into the skin every 14 (fourteen) days., Disp: 6 each, Rfl: 0   benzonatate (TESSALON) 200 MG capsule, Take 1 capsule (200 mg total) by mouth 3 (three) times daily as needed for cough. (Patient not taking: Reported on 10/30/2022), Disp: 30 capsule, Rfl: 0   Budeson-Glycopyrrol-Formoterol (BREZTRI AEROSPHERE) 160-9-4.8 MCG/ACT AERO, Inhale 2 puffs into the lungs in the morning and at bedtime. (Patient not taking: Reported on 10/30/2022), Disp: 5.9 g, Rfl: 0   co-enzyme Q-10 30 MG capsule, Take 30 mg by mouth daily. (Patient not taking: Reported on 10/31/2022), Disp: , Rfl:    empagliflozin (JARDIANCE) 10 MG TABS tablet, Take 1 tablet (10 mg total) by mouth daily before breakfast., Disp: 90 tablet, Rfl: 3   levalbuterol (XOPENEX HFA) 45 MCG/ACT inhaler, Inhale 2 puffs into the lungs every 6 (six) hours as needed for wheezing., Disp: 1 each, Rfl: 12   metoprolol succinate (TOPROL XL) 25 MG 24 hr tablet, Take 0.5 tablets (12.5 mg total) by mouth daily., Disp: 45 tablet, Rfl: 3   naproxen sodium (ANAPROX DS) 550 MG tablet, Take 1 tablet (550 mg total) by mouth 2 (two) times daily with a meal. (Patient not taking: Reported on 06/11/2022), Disp: 60 tablet, Rfl: 1   predniSONE (DELTASONE) 10 MG tablet, Take 1 tablet (10 mg total) by mouth daily with breakfast., Disp: 90 tablet, Rfl: 0   sacubitril-valsartan (ENTRESTO) 24-26 MG, Take 0.5 tablets by mouth 2 (two) times daily. (Patient not taking: Reported on 06/11/2022), Disp: 90 tablet, Rfl: 3    spironolactone (ALDACTONE) 25 MG tablet, Take 0.5 tablets by mouth once., Disp: , Rfl:    Tiotropium Bromide-Olodaterol (STIOLTO RESPIMAT) 2.5-2.5 MCG/ACT AERS, Inhale 2 puffs into the lungs daily., Disp: 4 g, Rfl: 11 No current facility-administered medications for this visit.  Facility-Administered Medications Ordered in Other Visits:    albuterol (PROVENTIL) (2.5 MG/3ML) 0.083% nebulizer solution 2.5 mg, 2.5 mg, Nebulization, Once, Salena Saner, MD  Past Medical History: Past Medical History:  Diagnosis Date   COPD (chronic obstructive pulmonary disease) (HCC)    Emphysema of lung (HCC)    GERD (gastroesophageal reflux disease)    HFrEF (heart failure with reduced ejection fraction) (HCC)    a. 09/2018 Echo: EF 55-60%; b. 08/2020 Echo: EF 45-50%, grade 2 diastolic dysfunction; c. 10/2020 cMRI: EF 36%, mild to mod dil LV. Mod red RV fxn. No LGE/evidence of sarcoid. No signif valvular dzs   NICM (nonischemic cardiomyopathy) (HCC)    a. 09/2018 Echo: EF 55-60%; b. 08/2020 Echo: EF 45-50%; c. 10/2020 cMRI: EF 36%, mild to mod dil LV. Mod red RV fxn. No LGE/evidence of sarcoid. No signif valvular dzs; d. 12/2020 Cor CTA: Ca2+= 0. Nl cors.   Pulmonary nodule    a. 12/2020 CT chest: 1.3cm posterior basal LUL nodule and 3mm LLL nodule - unchanged.   RBBB    Sarcoidosis    a. Dx 10/2020 in [place] - prev on steroids/methotrexate.    Tobacco Use: Social History   Tobacco Use  Smoking Status Former   Packs/day:  1.00   Years: 25.00   Additional pack years: 0.00   Total pack years: 25.00   Types: Cigarettes   Quit date: 07/23/2008   Years since quitting: 14.3   Passive exposure: Never  Smokeless Tobacco Former   Types: Chew   Quit date: 07/24/2020    Labs: Review Flowsheet       Latest Ref Rng & Units 06/25/2018  Labs for [facility]P Cardiac and Pulmonary Rehab  Cholestrol 100 - 199 mg/dL 161   LDL (calc) 0 - 99 mg/dL 84   HDL-C >09 mg/dL 57   Trlycerides 0 - 604 mg/dL 70      Pulmonary  Assessment Scores:  Pulmonary Assessment Scores     Row Name 11/08/22 1201         ADL UCSD   ADL Phase Entry     SOB Score total 11     Rest 0     Walk 0     Stairs 3     Bath 0     Dress 0     Shop 0       CAT Score   CAT Score 13       mMRC Score   mMRC Score 1              UCSD: Self-administered rating of dyspnea associated with activities of daily living (ADLs) 6-point scale (0 = "not at all" to 5 = "maximal or unable to do because of breathlessness")  Scoring Scores range from 0 to 120.  Minimally important difference is 5 units  CAT: CAT can identify the health impairment of COPD patients and is better correlated with disease progression.  CAT has a scoring range of zero to 40. The CAT score is classified into four groups of low (less than 10), medium (10 - 20), high (21-30) and very high (31-40) based on the impact level of disease on health status. A CAT score over 10 suggests significant symptoms.  A worsening CAT score could be explained by an exacerbation, poor medication adherence, poor inhaler technique, or progression of COPD or comorbid conditions.  CAT MCID is 2 points  mMRC: mMRC (Modified Medical Research Council) Dyspnea Scale is used to assess the degree of baseline functional disability in patients of respiratory disease due to dyspnea. No minimal important difference is established. A decrease in score of 1 point or greater is considered a positive change.   Pulmonary Function Assessment:  Pulmonary Function Assessment - 11/01/22 1010       Pulmonary Function Tests   FEV1% 63 %    FEV1/FVC Ratio 58      Breath   Shortness of Breath Yes;Limiting activity             Exercise Target Goals: Exercise Program Goal: Individual exercise prescription set using results from initial 6 min walk test and THRR while considering  patient's activity barriers and safety.   Exercise Prescription Goal: Initial exercise prescription builds to 30-45  minutes a day of aerobic activity, 2-3 days per week.  Home exercise guidelines will be given to patient during program as part of exercise prescription that the participant will acknowledge.  Education: Aerobic Exercise: - Group verbal and visual presentation on the components of exercise prescription. Introduces F.I.T.T principle from ACSM for exercise prescriptions.  Reviews F.I.T.T. principles of aerobic exercise including progression. Written material given at graduation.   Education: Resistance Exercise: - Group verbal and visual presentation on the components of exercise prescription. Introduces  F.I.T.T principle from ACSM for exercise prescriptions  Reviews F.I.T.T. principles of resistance exercise including progression. Written material given at graduation.    Education: Exercise & Equipment Safety: - Individual verbal instruction and demonstration of equipment use and safety with use of the equipment. Flowsheet Row Pulmonary Rehab from 11/01/2022 in [facility] Cardiac and Pulmonary Rehab  Date 11/01/22  Educator jh  Instruction Review Code 1- Verbalizes Understanding       Education: Exercise Physiology & General Exercise Guidelines: - Group verbal and written instruction with models to review the exercise physiology of the cardiovascular system and associated critical values. Provides general exercise guidelines with specific guidelines to those with heart or lung disease.    Education: Flexibility, Balance, Mind/Body Relaxation: - Group verbal and visual presentation with interactive activity on the components of exercise prescription. Introduces F.I.T.T principle from ACSM for exercise prescriptions. Reviews F.I.T.T. principles of flexibility and balance exercise training including progression. Also discusses the mind body connection.  Reviews various relaxation techniques to help reduce and manage stress (i.e. Deep breathing, progressive muscle relaxation, and visualization). Balance  handout provided to take home. Written material given at graduation.   Activity Barriers & Risk Stratification:  Activity Barriers & Cardiac Risk Stratification - 11/08/22 1303       Activity Barriers & Cardiac Risk Stratification   Activity Barriers Shortness of Breath;Other (comment)    Comments Overall joint and muscle pain             6 Minute Walk:  6 Minute Walk     Row Name 11/08/22 1253         6 Minute Walk   Phase Initial     Distance 1505 feet     Walk Time 6 minutes     # of Rest Breaks 0     MPH 2.85     METS 4.19     RPE 7     Perceived Dyspnea  0     VO2 Peak 14.67     Symptoms No     Resting HR 62 bpm     Resting BP 110/68     Resting Oxygen Saturation  96 %     Exercise Oxygen Saturation  during 6 min walk 93 %     Max Ex. HR 90 bpm     Max Ex. BP 126/70     2 Minute Post BP 114/68       Interval HR   1 Minute HR 74     2 Minute HR 80     3 Minute HR 85     4 Minute HR 87     5 Minute HR 86     6 Minute HR 90     2 Minute Post HR 61     Interval Heart Rate? Yes       Interval Oxygen   Interval Oxygen? Yes     Baseline Oxygen Saturation % 96 %     1 Minute Oxygen Saturation % 95 %     1 Minute Liters of Oxygen 0 L  RA     2 Minute Oxygen Saturation % 94 %     2 Minute Liters of Oxygen 0 L     3 Minute Oxygen Saturation % 93 %     3 Minute Liters of Oxygen 0 L     4 Minute Oxygen Saturation % 94 %     4 Minute Liters of Oxygen 0 L  5 Minute Oxygen Saturation % 95 %     5 Minute Liters of Oxygen 0 L     6 Minute Oxygen Saturation % 95 %     6 Minute Liters of Oxygen 0 L     2 Minute Post Oxygen Saturation % 96 %     2 Minute Post Liters of Oxygen 0 L             Oxygen Initial Assessment:  Oxygen Initial Assessment - 11/08/22 1200       Home Oxygen   Home Oxygen Device None    Sleep Oxygen Prescription None    Home Exercise Oxygen Prescription None    Home Resting Oxygen Prescription None      Initial 6 min Walk    Oxygen Used None      Program Oxygen Prescription   Program Oxygen Prescription None      Intervention   Short Term Goals To learn and demonstrate proper use of respiratory medications;To learn and understand importance of maintaining oxygen saturations>88%;To learn and demonstrate proper pursed lip breathing techniques or other breathing techniques. ;To learn and understand importance of monitoring SPO2 with pulse oximeter and demonstrate accurate use of the pulse oximeter.;To learn and exhibit compliance with exercise, home and travel O2 prescription    Long  Term Goals Exhibits compliance with exercise, home  and travel O2 prescription;Maintenance of O2 saturations>88%;Compliance with respiratory medication;Verbalizes importance of monitoring SPO2 with pulse oximeter and return demonstration;Exhibits proper breathing techniques, such as pursed lip breathing or other method taught during program session;Demonstrates proper use of MDI's             Oxygen Re-Evaluation:  Oxygen Re-Evaluation     Row Name 11/14/22 1741 11/28/22 1729           Program Oxygen Prescription   Program Oxygen Prescription -- None        Home Oxygen   Home Oxygen Device -- None      Sleep Oxygen Prescription -- None      Home Exercise Oxygen Prescription -- None      Home Resting Oxygen Prescription -- None        Goals/Expected Outcomes   Short Term Goals -- To learn and understand importance of maintaining oxygen saturations>88%;To learn and understand importance of monitoring SPO2 with pulse oximeter and demonstrate accurate use of the pulse oximeter.      Long  Term Goals -- Maintenance of O2 saturations>88%;Verbalizes importance of monitoring SPO2 with pulse oximeter and return demonstration      Comments Reviewed PLB technique with pt.  Talked about how it works and it's importance in maintaining their exercise saturations. --      Goals/Expected Outcomes Short: Become more profiecient at using  PLB.   Long: Become independent at using PLB. --               Oxygen Discharge (Final Oxygen Re-Evaluation):  Oxygen Re-Evaluation - 11/28/22 1729       Program Oxygen Prescription   Program Oxygen Prescription None      Home Oxygen   Home Oxygen Device None    Sleep Oxygen Prescription None    Home Exercise Oxygen Prescription None    Home Resting Oxygen Prescription None      Goals/Expected Outcomes   Short Term Goals To learn and understand importance of maintaining oxygen saturations>88%;To learn and understand importance of monitoring SPO2 with pulse oximeter and demonstrate accurate use of the pulse oximeter.  Long  Term Goals Maintenance of O2 saturations>88%;Verbalizes importance of monitoring SPO2 with pulse oximeter and return demonstration             Initial Exercise Prescription:  Initial Exercise Prescription - 11/08/22 1200       Date of Initial Exercise RX and Referring Provider   Date 11/08/22      Oxygen   Maintain Oxygen Saturation 88% or higher      Treadmill   MPH 2.9    Grade 1    Minutes 15    METs 3.62      Recumbant Bike   Level 3    Watts 35    Minutes 15    METs 4      Recumbant Elliptical   Level 2.5    Minutes 15    METs 4      REL-XR   Level 3    Minutes 15    METs 4      Prescription Details   Frequency (times per week) 2    Duration Progress to 30 minutes of continuous aerobic without signs/symptoms of physical distress      Intensity   THRR 40-80% of Max Heartrate 104 - 147    Ratings of Perceived Exertion 11-13    Perceived Dyspnea 0-4      Progression   Progression Continue to progress workloads to maintain intensity without signs/symptoms of physical distress.      Resistance Training   Training Prescription Yes    Weight 5 lb    Reps 10-15             Perform Capillary Blood Glucose checks as needed.  Exercise Prescription Changes:   Exercise Prescription Changes     Row Name 11/08/22  1200 11/19/22 1400 11/21/22 1800 12/03/22 1300       Response to Exercise   Blood Pressure (Admit) 110/68 108/72 -- 104/60    Blood Pressure (Exercise) 126/70 132/60 -- 112/64    Blood Pressure (Exit) 112/70 104/70 -- 106/68    Heart Rate (Admit) 62 bpm 74 bpm -- 80 bpm    Heart Rate (Exercise) 90 bpm 105 bpm -- 107 bpm    Heart Rate (Exit) 61 bpm 75 bpm -- 87 bpm    Oxygen Saturation (Admit) 96 % 95 % -- 96 %    Oxygen Saturation (Exercise) 93 % 94 % -- 94 %    Oxygen Saturation (Exit) 96 % 95 % -- 97 %    Rating of Perceived Exertion (Exercise) 7 11 -- 13    Perceived Dyspnea (Exercise) 0 1 -- 2    Symptoms none none -- SOB    Comments walk test results First two days of exercise -- --    Duration -- Continue with 30 min of aerobic exercise without signs/symptoms of physical distress. -- Continue with 30 min of aerobic exercise without signs/symptoms of physical distress.    Intensity -- THRR unchanged -- THRR unchanged      Progression   Progression -- Continue to progress workloads to maintain intensity without signs/symptoms of physical distress. -- Continue to progress workloads to maintain intensity without signs/symptoms of physical distress.    Average METs -- 3.94 -- 4.14      Resistance Training   Training Prescription -- Yes -- Yes    Weight -- 5 lb -- 7 lb    Reps -- 10-15 -- 10-15      Interval Training   Interval Training --  No -- No      Treadmill   MPH -- 2.8 -- 3.1    Grade -- 2 -- 2.5    Minutes -- 15 -- 15    METs -- 3.91 -- 4.44      Recumbant Bike   Level -- 5 -- 6    Watts -- 33 -- 65    Minutes -- 15 -- 15    METs -- 3.44 -- 4.73      NuStep   Level -- -- -- 1    Minutes -- -- -- 15    METs -- -- -- 4.44      REL-XR   Level -- 5 -- 4    Minutes -- 15 -- 15    METs -- 4.8 -- 4.5      Home Exercise Plan   Plans to continue exercise at -- -- Home (comment)  walking, treadmill, stationary bike, weights Home (comment)  walking, treadmill,  stationary bike, weights    Frequency -- -- Add 2 additional days to program exercise sessions. Add 2 additional days to program exercise sessions.    Initial Home Exercises Provided -- -- 11/21/22 11/21/22      Oxygen   Maintain Oxygen Saturation -- 88% or higher 88% or higher 88% or higher             Exercise Comments:   Exercise Comments     Row Name 11/14/22 1739           Exercise Comments First full day of exercise!  Patient was oriented to gym and equipment including functions, settings, policies, and procedures.  Patient's individual exercise prescription and treatment plan were reviewed.  All starting workloads were established based on the results of the 6 minute walk test done at initial orientation visit.  The plan for exercise progression was also introduced and progression will be customized based on patient's performance and goals.                Exercise Goals and Review:   Exercise Goals     Row Name 11/08/22 1301             Exercise Goals   Increase Physical Activity Yes       Intervention Develop an individualized exercise prescription for aerobic and resistive training based on initial evaluation findings, risk stratification, comorbidities and participant's personal goals.;Provide advice, education, support and counseling about physical activity/exercise needs.       Expected Outcomes Short Term: Attend rehab on a regular basis to increase amount of physical activity.;Long Term: Add in home exercise to make exercise part of routine and to increase amount of physical activity.;Long Term: Exercising regularly at least 3-5 days a week.       Increase Strength and Stamina Yes       Intervention Provide advice, education, support and counseling about physical activity/exercise needs.;Develop an individualized exercise prescription for aerobic and resistive training based on initial evaluation findings, risk stratification, comorbidities and participant's  personal goals.       Expected Outcomes Short Term: Increase workloads from initial exercise prescription for resistance, speed, and METs.;Short Term: Perform resistance training exercises routinely during rehab and add in resistance training at home;Long Term: Improve cardiorespiratory fitness, muscular endurance and strength as measured by increased METs and functional capacity ( )       Able to understand and use rate of perceived exertion (RPE) scale Yes       Intervention Provide education  and explanation on how to use RPE scale       Expected Outcomes Short Term: Able to use RPE daily in rehab to express subjective intensity level;Long Term:  Able to use RPE to guide intensity level when exercising independently       Able to understand and use Dyspnea scale Yes       Intervention Provide education and explanation on how to use Dyspnea scale       Expected Outcomes Short Term: Able to use Dyspnea scale daily in rehab to express subjective sense of shortness of breath during exertion;Long Term: Able to use Dyspnea scale to guide intensity level when exercising independently       Knowledge and understanding of Target Heart Rate Range (THRR) Yes       Intervention Provide education and explanation of THRR including how the numbers were predicted and where they are located for reference       Expected Outcomes Short Term: Able to state/look up THRR;Long Term: Able to use THRR to govern intensity when exercising independently;Short Term: Able to use daily as guideline for intensity in rehab       Able to check pulse independently Yes       Intervention Provide education and demonstration on how to check pulse in carotid and radial arteries.;Review the importance of being able to check your own pulse for safety during independent exercise       Expected Outcomes Long Term: Able to check pulse independently and accurately;Short Term: Able to explain why pulse checking is important during independent  exercise       Understanding of Exercise Prescription Yes       Intervention Provide education, explanation, and written materials on patient's individual exercise prescription       Expected Outcomes Short Term: Able to explain program exercise prescription;Long Term: Able to explain home exercise prescription to exercise independently                Exercise Goals Re-Evaluation :  Exercise Goals Re-Evaluation     Row Name 11/14/22 1740 11/19/22 1501 11/21/22 1806 12/03/22 1317       Exercise Goal Re-Evaluation   Exercise Goals Review Increase Physical Activity;Able to understand and use rate of perceived exertion (RPE) scale;Knowledge and understanding of Target Heart Rate Range (THRR);Understanding of Exercise Prescription;Increase Strength and Stamina;Able to check pulse independently;Able to understand and use Dyspnea scale Increase Physical Activity;Increase Strength and Stamina;Understanding of Exercise Prescription Increase Physical Activity;Increase Strength and Stamina;Understanding of Exercise Prescription;Able to understand and use Dyspnea scale;Able to check pulse independently;Able to understand and use rate of perceived exertion (RPE) scale;Knowledge and understanding of Target Heart Rate Range (THRR) Increase Strength and Stamina;Increase Physical Activity;Understanding of Exercise Prescription    Comments Reviewed RPE scale, THR and program prescription with pt today.  Pt voiced understanding and was given a copy of goals to take home. Oaklyn is off to a good start in the program. He was able to work up to a speed of 2.8 mph with a 2% incline on the treadmill during his first two sessions. He also worked at level 5 on both the XR and recumbent bike. He did well with 5 lb hand weights for resistance training as well. We will continue to monitor his progress in the program. Reviewed home exercise with pt today.  Pt plans to walk outside and on the treadmill for exercise.  He also  has weights at home and a stationary bike to use.  HE had been using them but hasn't for the last week. Encouraged to add on 1 day of structured exercise at home. Explain pysical activity versus being physically active. Reviewed THR, pulse, RPE, sign and symptoms, pulse oximetery and when to call 911 or MD.  Also discussed weather considerations and indoor options.  Pt voiced understanding. Saburo continues to do well in rehab. He was able to increase to level 6 on the recumbent bike working over 65 watts! He also increased his workload on the treadmill to 3.1/2.5%- all working with appropriate RPEs. Emerik is now using 7 lb for handweights . He has been reaching his THR some sessions so far. Will continue to monitor.    Expected Outcomes Short: Use RPE daily to regulate intensity.  Long: Follow program prescription in THR. Short: Continue to progressively increase workloads. Long: Continue to improve strength and stamina. Short: Add on 1 day of structred exercise Long: Continue to exercise indepedently at home at appropriate prescription Short: Continue working up watts on recumbent bike Long: Continue to increase overall MET level and stmaina             Discharge Exercise Prescription (Final Exercise Prescription Changes):  Exercise Prescription Changes - 12/03/22 1300       Response to Exercise   Blood Pressure (Admit) 104/60    Blood Pressure (Exercise) 112/64    Blood Pressure (Exit) 106/68    Heart Rate (Admit) 80 bpm    Heart Rate (Exercise) 107 bpm    Heart Rate (Exit) 87 bpm    Oxygen Saturation (Admit) 96 %    Oxygen Saturation (Exercise) 94 %    Oxygen Saturation (Exit) 97 %    Rating of Perceived Exertion (Exercise) 13    Perceived Dyspnea (Exercise) 2    Symptoms SOB    Duration Continue with 30 min of aerobic exercise without signs/symptoms of physical distress.    Intensity THRR unchanged      Progression   Progression Continue to progress workloads to maintain intensity  without signs/symptoms of physical distress.    Average METs 4.14      Resistance Training   Training Prescription Yes    Weight 7 lb    Reps 10-15      Interval Training   Interval Training No      Treadmill   MPH 3.1    Grade 2.5    Minutes 15    METs 4.44      Recumbant Bike   Level 6    Watts 65    Minutes 15    METs 4.73      NuStep   Level 1    Minutes 15    METs 4.44      REL-XR   Level 4    Minutes 15    METs 4.5      Home Exercise Plan   Plans to continue exercise at Home (comment)   walking, treadmill, stationary bike, weights   Frequency Add 2 additional days to program exercise sessions.    Initial Home Exercises Provided 11/21/22      Oxygen   Maintain Oxygen Saturation 88% or higher             Nutrition:  Target Goals: Understanding of nutrition guidelines, daily intake of sodium 1500mg , cholesterol 200mg , calories 30% from fat and 7% or less from saturated fats, daily to have 5 or more servings of fruits and vegetables.  Education: All About Nutrition: -Group instruction provided by  verbal, written material, interactive activities, discussions, models, and posters to present general guidelines for heart healthy nutrition including fat, fiber, MyPlate, the role of sodium in heart healthy nutrition, utilization of the nutrition label, and utilization of this knowledge for meal planning. Follow up email sent as well. Written material given at graduation.   Biometrics:  Pre Biometrics - 11/08/22 1303       Pre Biometrics   Height 5\' 7"  (1.702 m)    Weight 171 lb 14.4 oz (78 kg)    Waist Circumference --   declined   Hip Circumference --   declined   BMI (Calculated) 26.92    Single Leg Stand 30 seconds              Nutrition Therapy Plan and Nutrition Goals:  Nutrition Therapy & Goals - 11/08/22 1158       Nutrition Therapy   RD appointment deferred Yes   Deferred     Intervention Plan   Intervention Prescribe, educate and  counsel regarding individualized specific dietary modifications aiming towards targeted core components such as weight, hypertension, lipid management, diabetes, heart failure and other comorbidities.    Expected Outcomes Short Term Goal: Understand basic principles of dietary content, such as calories, fat, sodium, cholesterol and nutrients.;Short Term Goal: A plan has been developed with personal nutrition goals set during dietitian appointment.;Long Term Goal: Adherence to prescribed nutrition plan.             Nutrition Assessments:  MEDIFICTS Score Key: ?70 Need to make dietary changes  40-70 Heart Healthy Diet ? 40 Therapeutic Level Cholesterol Diet  Flowsheet Row Pulmonary Rehab from 11/08/2022 in [facility] Cardiac and Pulmonary Rehab  Picture Your Plate Total Score on Admission 64      Picture Your Plate Scores: <16 Unhealthy dietary pattern with much room for improvement. 41-50 Dietary pattern unlikely to meet recommendations for good health and room for improvement. 51-60 More healthful dietary pattern, with some room for improvement.  >60 Healthy dietary pattern, although there may be some specific behaviors that could be improved.   Nutrition Goals Re-Evaluation:  Nutrition Goals Re-Evaluation     Row Name 11/28/22 1730             Goals   Current Weight 169 lb (76.7 kg)       Nutrition Goal eat smaller portions.       Comment Patient was informed on why it is important to maintain a balanced diet when dealing with Respiratory issues. Explained that it takes a lot of energy to breath and when they are short of breath often they will need to have a good diet to help keep up with the calories they are expending for breathing.       Expected Outcome Short: Choose and plan snacks accordingly to patients caloric intake to improve breathing. Long: Maintain a diet independently that meets their caloric intake to aid in daily shortness of breath.                Nutrition  Goals Discharge (Final Nutrition Goals Re-Evaluation):  Nutrition Goals Re-Evaluation - 11/28/22 1730       Goals   Current Weight 169 lb (76.7 kg)    Nutrition Goal eat smaller portions.    Comment Patient was informed on why it is important to maintain a balanced diet when dealing with Respiratory issues. Explained that it takes a lot of energy to breath and when they are short of breath often they  will need to have a good diet to help keep up with the calories they are expending for breathing.    Expected Outcome Short: Choose and plan snacks accordingly to patients caloric intake to improve breathing. Long: Maintain a diet independently that meets their caloric intake to aid in daily shortness of breath.             Psychosocial: Target Goals: Acknowledge presence or absence of significant depression and/or stress, maximize coping skills, provide positive support system. Participant is able to verbalize types and ability to use techniques and skills needed for reducing stress and depression.   Education: Stress, Anxiety, and Depression - Group verbal and visual presentation to define topics covered.  Reviews how body is impacted by stress, anxiety, and depression.  Also discusses healthy ways to reduce stress and to treat/manage anxiety and depression.  Written material given at graduation.   Education: Sleep Hygiene -Provides group verbal and written instruction about how sleep can affect your health.  Define sleep hygiene, discuss sleep cycles and impact of sleep habits. Review good sleep hygiene tips.    Initial Review & Psychosocial Screening:  Initial Psych Review & Screening - 11/01/22 1011       Initial Review   Current issues with Current Depression;History of Depression;Current Stress Concerns;Current Sleep Concerns    Source of Stress Concerns Chronic Illness    Comments His health issues get him down sometimes but does not want or need to take medication for it.  He  sometimes is worried to go to sleep with fear that he will not wake up with saroidosis of the heart.      Family Dynamics   Good Support System? Yes    Comments He can look to his wife for support.             Quality of Life Scores:  Scores of 19 and below usually indicate a poorer quality of life in these areas.  A difference of  2-3 points is a clinically meaningful difference.  A difference of 2-3 points in the total score of the Quality of Life Index has been associated with significant improvement in overall quality of life, self-image, physical symptoms, and general health in studies assessing change in quality of life.  PHQ-9: Review Flowsheet       11/08/2022 08/05/2018 06/25/2018  Depression screen PHQ 2/9  Decreased Interest 0 0 0  Down, Depressed, Hopeless 0 - 0  PHQ - 2 Score 0 0 0  Altered sleeping 1 1 1   Tired, decreased energy 1 1 1   Change in appetite 0 0 0  Feeling bad or failure about yourself  0 0 0  Trouble concentrating 0 0 0  Moving slowly or fidgety/restless 0 0 0  Suicidal thoughts 0 0 0  PHQ-9 Score 2 2 2   Difficult doing work/chores Not difficult at all Not difficult at all Not difficult at all   Interpretation of Total Score  Total Score Depression Severity:  1-4 = Minimal depression, 5-9 = Mild depression, 10-14 = Moderate depression, 15-19 = Moderately severe depression, 20-27 = Severe depression   Psychosocial Evaluation and Intervention:  Psychosocial Evaluation - 11/01/22 1013       Psychosocial Evaluation & Interventions   Interventions Encouraged to exercise with the program and follow exercise prescription;Relaxation education;Stress management education    Comments His health issues get him down sometimes but does not want or need to take medication for it.He can look to his wife for  support.He sometimes is worried to go to sleep with fear that he will not wake up with saroidosis of the heart.    Expected Outcomes Short: Start [facility]  to help with mood. Long: Maintain a healthy mental state    Continue Psychosocial Services  Follow up required by staff             Psychosocial Re-Evaluation:  Psychosocial Re-Evaluation     Row Name 11/28/22 1733             Psychosocial Re-Evaluation   Current issues with None Identified       Comments Patient reports no issues with their current mental states, sleep, stress, depression or anxiety. Will follow up with patient in a few weeks for any changes.       Expected Outcomes Short: Continue to exercise regularly to support mental health and notify staff of any changes. Long: maintain mental health and well being through teaching of rehab or prescribed medications independently.       Interventions Encouraged to attend Pulmonary Rehabilitation for the exercise       Continue Psychosocial Services  Follow up required by staff                Psychosocial Discharge (Final Psychosocial Re-Evaluation):  Psychosocial Re-Evaluation - 11/28/22 1733       Psychosocial Re-Evaluation   Current issues with None Identified    Comments Patient reports no issues with their current mental states, sleep, stress, depression or anxiety. Will follow up with patient in a few weeks for any changes.    Expected Outcomes Short: Continue to exercise regularly to support mental health and notify staff of any changes. Long: maintain mental health and well being through teaching of rehab or prescribed medications independently.    Interventions Encouraged to attend Pulmonary Rehabilitation for the exercise    Continue Psychosocial Services  Follow up required by staff             Education: Education Goals: Education classes will be provided on a weekly basis, covering required topics. Participant will state understanding/return demonstration of topics presented.  Learning Barriers/Preferences:  Learning Barriers/Preferences - 11/01/22 1010       Learning Barriers/Preferences    Learning Barriers None    Learning Preferences None             General Pulmonary Education Topics:  Infection Prevention: - Provides verbal and written material to individual with discussion of infection control including proper hand washing and proper equipment cleaning during exercise session. Flowsheet Row Pulmonary Rehab from 11/01/2022 in [facility] Cardiac and Pulmonary Rehab  Date 11/01/22  Educator jh  Instruction Review Code 1- Verbalizes Understanding       Falls Prevention: - Provides verbal and written material to individual with discussion of falls prevention and safety. Flowsheet Row Pulmonary Rehab from 11/01/2022 in [facility] Cardiac and Pulmonary Rehab  Date 11/01/22  Educator jh  Instruction Review Code 1- Verbalizes Understanding       Chronic Lung Disease Review: - Group verbal instruction with posters, models, PowerPoint presentations and videos,  to review new updates, new respiratory medications, new advancements in procedures and treatments. Providing information on websites and "800" numbers for continued self-education. Includes information about supplement oxygen, available portable oxygen systems, continuous and intermittent flow rates, oxygen safety, concentrators, and Medicare reimbursement for oxygen. Explanation of Pulmonary Drugs, including class, frequency, complications, importance of spacers, rinsing mouth after steroid MDI's, and proper cleaning methods for nebulizers. Review of  basic lung anatomy and physiology related to function, structure, and complications of lung disease. Review of risk factors. Discussion about methods for diagnosing sleep apnea and types of masks and machines for OSA. Includes a review of the use of types of environmental controls: home humidity, furnaces, filters, dust mite/pet prevention, HEPA vacuums. Discussion about weather changes, air quality and the benefits of nasal washing. Instruction on Warning signs, infection symptoms,  calling MD promptly, preventive modes, and value of vaccinations. Review of effective airway clearance, coughing and/or vibration techniques. Emphasizing that all should Create an Action Plan. Written material given at graduation. Flowsheet Row Pulmonary Rehab from 11/08/2022 in [facility] Cardiac and Pulmonary Rehab  Education need identified 11/08/22       AED/CPR: - Group verbal and written instruction with the use of models to demonstrate the basic use of the AED with the basic ABC's of resuscitation.    Anatomy and Cardiac Procedures: - Group verbal and visual presentation and models provide information about basic cardiac anatomy and function. Reviews the testing methods done to diagnose heart disease and the outcomes of the test results. Describes the treatment choices: Medical Management, Angioplasty, or Coronary Bypass Surgery for treating various heart conditions including Myocardial Infarction, Angina, Valve Disease, and Cardiac Arrhythmias.  Written material given at graduation.   Medication Safety: - Group verbal and visual instruction to review commonly prescribed medications for heart and lung disease. Reviews the medication, class of the drug, and side effects. Includes the steps to properly store meds and maintain the prescription regimen.  Written material given at graduation.   Other: -Provides group and verbal instruction on various topics (see comments)   Knowledge Questionnaire Score:  Knowledge Questionnaire Score - 11/08/22 1158       Knowledge Questionnaire Score   Pre Score 16/18              Core Components/Risk Factors/Patient Goals at Admission:  Personal Goals and Risk Factors at Admission - 11/08/22 1302       Core Components/Risk Factors/Patient Goals on Admission    Weight Management Yes;Weight Maintenance    Intervention Weight Management: Develop a combined nutrition and exercise program designed to reach desired caloric intake, while maintaining  appropriate intake of nutrient and fiber, sodium and fats, and appropriate energy expenditure required for the weight goal.;Weight Management/Obesity: Establish reasonable short term and long term weight goals.;Weight Management: Provide education and appropriate resources to help participant work on and attain dietary goals.    Admit Weight 171 lb (77.6 kg)    Goal Weight: Short Term 171 lb (77.6 kg)    Goal Weight: Long Term 171 lb (77.6 kg)    Expected Outcomes Short Term: Continue to assess and modify interventions until short term weight is achieved;Long Term: Adherence to nutrition and physical activity/exercise program aimed toward attainment of established weight goal;Weight Maintenance: Understanding of the daily nutrition guidelines, which includes 25-35% calories from fat, 7% or less cal from saturated fats, less than 200mg  cholesterol, less than 1.5gm of sodium, & 5 or more servings of fruits and vegetables daily;Understanding recommendations for meals to include 15-35% energy as protein, 25-35% energy from fat, 35-60% energy from carbohydrates, less than 200mg  of dietary cholesterol, 20-35 gm of total fiber daily;Understanding of distribution of calorie intake throughout the day with the consumption of 4-5 meals/snacks    Improve shortness of breath with ADL's Yes    Intervention Provide education, individualized exercise plan and daily activity instruction to help decrease symptoms of SOB with  activities of daily living.    Expected Outcomes Short Term: Improve cardiorespiratory fitness to achieve a reduction of symptoms when performing ADLs;Long Term: Be able to perform more ADLs without symptoms or delay the onset of symptoms    Hypertension Yes    Intervention Provide education on lifestyle modifcations including regular physical activity/exercise, weight management, moderate sodium restriction and increased consumption of fresh fruit, vegetables, and low fat dairy, alcohol moderation, and  smoking cessation.;Monitor prescription use compliance.    Expected Outcomes Short Term: Continued assessment and intervention until BP is < 140/64mm HG in hypertensive participants. < 130/46mm HG in hypertensive participants with diabetes, heart failure or chronic kidney disease.;Long Term: Maintenance of blood pressure at goal levels.             Education:Diabetes - Individual verbal and written instruction to review signs/symptoms of diabetes, desired ranges of glucose level fasting, after meals and with exercise. Acknowledge that pre and post exercise glucose checks will be done for 3 sessions at entry of program.   Know Your Numbers and Heart Failure: - Group verbal and visual instruction to discuss disease risk factors for cardiac and pulmonary disease and treatment options.  Reviews associated critical values for Overweight/Obesity, Hypertension, Cholesterol, and Diabetes.  Discusses basics of heart failure: signs/symptoms and treatments.  Introduces Heart Failure Zone chart for action plan for heart failure.  Written material given at graduation.   Core Components/Risk Factors/Patient Goals Review:   Goals and Risk Factor Review     Row Name 11/28/22 1731             Core Components/Risk Factors/Patient Goals Review   Personal Goals Review Improve shortness of breath with ADL's       Review Spoke to patient about their shortness of breath and what they can do to improve. Patient has been informed of breathing techniques when starting the program. Patient is informed to tell staff if they have had any med changes and that certain meds they are taking or not taking can be causing shortness of breath.       Expected Outcomes Short: Attend [facility] regularly to improve shortness of breath with ADL's. Long: maintain independence with ADL's                Core Components/Risk Factors/Patient Goals at Discharge (Final Review):   Goals and Risk Factor Review - 11/28/22 1731        Core Components/Risk Factors/Patient Goals Review   Personal Goals Review Improve shortness of breath with ADL's    Review Spoke to patient about their shortness of breath and what they can do to improve. Patient has been informed of breathing techniques when starting the program. Patient is informed to tell staff if they have had any med changes and that certain meds they are taking or not taking can be causing shortness of breath.    Expected Outcomes Short: Attend [facility] regularly to improve shortness of breath with ADL's. Long: maintain independence with ADL's             ITP Comments:  ITP Comments     Row Name 11/01/22 1009 11/08/22 1157 11/14/22 1739 12/05/22 0753     ITP Comments Virtual Visit completed. Patient informed on EP and RD appointment and 6 Minute walk test. Patient also informed of patient health questionnaires on My Chart. Patient Verbalizes understanding. Visit diagnosis can be found in [facility] 10/15/[facility]. Completed and gym orientation. Initial ITP created and sent for review to Dr.  Vida Rigger, Medical Director. First full day of exercise!  Patient was oriented to gym and equipment including functions, settings, policies, and procedures.  Patient's individual exercise prescription and treatment plan were reviewed.  All starting workloads were established based on the results of the 6 minute walk test done at initial orientation visit.  The plan for exercise progression was also introduced and progression will be customized based on patient's performance and goals. 30 Day review completed. Medical Director ITP review done, changes made as directed, and signed approval by Medical Director.    New to program             Comments:

## 2022-12-05 NOTE — Progress Notes (Signed)
Daily Session Note  Patient Details  Name: Steve Wang MRN: [id number] Date of Birth: [birth year] Referring Provider:    Encounter Date: 12/05/2022  Check In:  Session Check In - 12/05/22 1731       Check-In   Supervising physician immediately available to respond to emergencies See telemetry face sheet for immediately available ER MD    Location [facility]-Cardiac & Pulmonary Rehab    Staff Present Susann Givens, RN BSN;Laureen Manson Passey, BS, RRT, CPFT;Kara Clinton Sawyer, MS, ACSM CEP, Exercise Physiologist    Virtual Visit No    Medication changes reported     No    Fall or balance concerns reported    No    Warm-up and Cool-down Performed on first and last piece of equipment    Resistance Training Performed Yes    VAD Patient? No    PAD/SET Patient? No      Pain Assessment   Currently in Pain? No/denies                Social History   Tobacco Use  Smoking Status Former   Packs/day: 1.00   Years: 25.00   Additional pack years: 0.00   Total pack years: 25.00   Types: Cigarettes   Quit date: 07/23/2008   Years since quitting: 14.3   Passive exposure: Never  Smokeless Tobacco Former   Types: Chew   Quit date: 07/24/2020    Goals Met:  Independence with exercise equipment Exercise tolerated well No report of concerns or symptoms today Strength training completed today  Goals Unmet:  Not Applicable  Comments: Pt able to follow exercise prescription today without complaint.  Will continue to monitor for progression.    Dr. Bethann Punches is Medical Director for [facility] Cardiac Rehabilitation.  Dr. Vida Rigger is Medical Director for [facility] Pulmonary Rehabilitation.

## 2022-12-06 ENCOUNTER — Encounter: Payer: BC Managed Care – PPO | Admitting: *Deleted

## 2022-12-06 DIAGNOSIS — J449 Chronic obstructive pulmonary disease, unspecified: Secondary | ICD-10-CM | POA: Diagnosis not present

## 2022-12-06 NOTE — Progress Notes (Signed)
Daily Session Note  Patient Details  Name: Steve Wang MRN: [id number] Date of Birth: 04-29-1971 Referring Provider:    Encounter Date: 12/06/2022  Check In:  Session Check In - 12/06/22 1727       Check-In   Supervising physician immediately available to respond to emergencies See telemetry face sheet for immediately available ER MD    Location [facility]-Cardiac & Pulmonary Rehab    Staff Present Susann Givens, RN BSN;Joseph Pomeroy, RCP,RRT,BSRT;Kara Marianna, MS, ACSM CEP, Exercise Physiologist    Virtual Visit No    Medication changes reported     No    Fall or balance concerns reported    No    Warm-up and Cool-down Performed on first and last piece of equipment    Resistance Training Performed Yes    VAD Patient? No    PAD/SET Patient? No      Pain Assessment   Currently in Pain? No/denies                Social History   Tobacco Use  Smoking Status Former   Packs/day: 1.00   Years: 25.00   Additional pack years: 0.00   Total pack years: 25.00   Types: Cigarettes   Quit date: 07/23/2008   Years since quitting: 14.3   Passive exposure: Never  Smokeless Tobacco Former   Types: Chew   Quit date: 07/24/2020    Goals Met:  Independence with exercise equipment Exercise tolerated well No report of concerns or symptoms today Strength training completed today  Goals Unmet:  Not Applicable  Comments: Pt able to follow exercise prescription today without complaint.  Will continue to monitor for progression.    Dr. Bethann Punches is Medical Director for [facility] Cardiac Rehabilitation.  Dr. Vida Rigger is Medical Director for [facility] Pulmonary Rehabilitation.

## 2022-12-11 ENCOUNTER — Encounter: Payer: Self-pay | Admitting: Pulmonary Disease

## 2022-12-11 ENCOUNTER — Ambulatory Visit (INDEPENDENT_AMBULATORY_CARE_PROVIDER_SITE_OTHER): Payer: BC Managed Care – PPO | Admitting: Pulmonary Disease

## 2022-12-11 VITALS — BP 120/78 | HR 60 | Temp 98.2°F | Ht 67.0 in | Wt 173.8 lb

## 2022-12-11 DIAGNOSIS — D869 Sarcoidosis, unspecified: Secondary | ICD-10-CM | POA: Diagnosis not present

## 2022-12-11 DIAGNOSIS — J449 Chronic obstructive pulmonary disease, unspecified: Secondary | ICD-10-CM | POA: Diagnosis not present

## 2022-12-11 DIAGNOSIS — J019 Acute sinusitis, unspecified: Secondary | ICD-10-CM | POA: Diagnosis not present

## 2022-12-11 MED ORDER — DOXYCYCLINE HYCLATE 100 MG PO TABS: 100.0000 mg | ORAL_TABLET | Freq: Two times a day (BID) | ORAL | 0 refills | Status: AC

## 2022-12-11 NOTE — Patient Instructions (Signed)
We have sent in a prescription for doxycycline to your pharmacy.  Make sure that if you are outside to wear sunscreen and/or any other protective clothing/sun protection while on the medication.  Will see you in follow-up in 3 months time.  We will schedule breathing test for around that time.

## 2022-12-11 NOTE — Progress Notes (Signed)
Subjective:    Patient ID: Steve Wang, male    DOB: [birth year], 52 y.o.   MRN: [id number] Patient Care Team: Malva Limes, MD as PCP - General (Family Medicine) Antonieta Iba, MD as PCP - Cardiology (Cardiology) Salena Saner, MD as Consulting Physician (Pulmonary Disease)  Chief Complaint  Patient presents with   Follow-up    SOB with exertion. No wheezing. Cough with yellow sputum since last Thursday.    HPI Steve Wang is a 52 year old very complex former smoker with 25-pack-year history of smoking and a history of sarcoidosis first diagnosed in 2012 at the [place] [facility] [place] by lung biopsy.  The patient was last seen here on 30 October 2022.  Since that visit he has been enrolled in pulmonary rehab and notes that this has been helpful.  He is still going to rehab sessions. 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  He has been started by [facility] rheumatology on Humira for management of his cardiac sarcoid as he has continued to show activity on methotrexate and prednisone.  He started Humira around mid March.  He has an upcoming cardiac PET/CT at [facility].  He also has an element of COPD from prior smoking PFTs have consistently shown moderate obstruction with a lesser degree concomitant restrictive defect. Patient is on Stiolto for his COPD issues, he endorses compliance.  He does not tolerate ICS containing inhalers.  He has not shown any findings consistent with asthmatic component. He has been alternating between [facility] cardiology and [facility]iology. With regards to pulmonary sarcoid this has been quiescent his areas of activity now appears to be mostly cardiac.   His shortness of breath has gotten worse gradually.  He also notices fatigue and arthralgias and myalgias.  Arthralgias and myalgias have improved on Humira.  He still does experience shortness of breath and fatigue with exertion.  He does not note  wheezing per se.  No recent cough or sputum production.  No hemoptysis.  Has had no fevers, chills or sweats.  Over the last 2 to 3 days he has noted increased nasal congestion and some mild purulent nasal discharge.  He states he feels he has a sinusitis.  No other acute symptoms.  No other symptomatology noted.   DATA: 10/13/2010 CT chest: Report only, performed at [facility] [place]: Upper lung predominant parenchymal changes, scarring and traction bronchiectasis with small peribronchial and perifissural nodules.  Enlarged bilateral hilar and mediastinal lymph nodes, findings all suggestive of sarcoidosis. 11/03/2010: ACE level 98, eosinophils elevated 8.1% 11/09/2010 Bronchoscopy specimens: [facility] [place], noncaseating granulomas right upper lobe, fungal culture negative for nocardia, Doratomyces species isolated, contaminant.  BAL 63% histiocytes, 33% lymphocytes, 2% neutrophils, 2% eosinophils.  Flow cytometry favored sarcoidosis. 09/21/2013 spirometry: Performed at [facility] FEV1 2.72 L or 76% predicted, FVC 4.42 L or 95% predicted, FEV1/FVC 61%, DLCO moderately reduced.  Consistent with mild obstructive defect (not congruent with sarcoidosis). 09/22/2018 2D echo: Performed in [facility], , LVEF 55 to 60% mild LV dilatation, no wall motion abnormalities, otherwise normal. 08/11/2020 angiotensin-converting enzyme: 61 (reference range 14 to 82 units/L) 08/30/2020 CT chest with contrast: Perihilar traction bronchiectasis, parenchymal retraction and architectural distortion consistent with history of sarcoid.  Left ventricular dilatation.  3 mm left lower lobe nodule. 08/30/2020 echocardiogram: LVEF 45 to 50%, LV with mildly decreased function, global hypokinesis, LV cavity size severely dilated, grade II diastolic dysfunction. 10/24/2020 cardiac morphology MR:  Mild to moderate dilated left ventricle, normal LV thickness, global hypokinesis, no regional wall motion  abnormalities.  Reduced RV and LV systolic function LVEF 36%.  No evidence for infiltrative disease.  Negative for cardiac sarcoidosis. 12/15/2020 CT coronary morphology: Coronary calcium score of 0, no evidence of CAD.  Lung windows findings as previous. 07/06/2021 myocardial PET/CT [facility]): Abnormal perfusion of the myocardium, normal wall motion and normal thickening.  No coronary artery calcifications, evidence of extracardiac areas in the chest of hypermetabolism suggest sarcoidosis.  Focal increased FDG uptake in the distal esophagus query esophagitis. 08/14/2021 CT chest with contrast: Stable exam from prior, no progressive findings, architectural distortion and scarring bilaterally compatible with prior history of sarcoidosis.  Stable 3 mm left lower lobe pulmonary nodule.  Pulmonary emphysema. 10/05/2021 PFTs: FEV1 2.21 L or 64% predicted, FVC 3.86 L or 87% predicted, FEV1/FVC 57%, low normal lung volumes, diffusion capacity normal.  Consistent with moderate obstruction.   10/19/2021 PET CT myocardial [facility]): Normal myocardial perfusion, normal left ventricular systolic function, wall motion and thickening, dilated left ventricle.  No areas of extracardiac hypermetabolic activity. 03/19/2022 2D echo: LVEF 40 to 45%, left ventricle demonstrates global hypokinesis. LVEF slightly worse than 08/2020 echocardiogram. 06/06/2022 PET/CT myocardial [facility]): 1. There are several segments ( 7 out of 17) with hypermetabolic activity suggestive of an active inflammatory process in the left ventricular myocardium. 2. Approximately 44% of the left ventricle is involved with predominantly mild/moderate/severe hypermetabolic activity. 3. There is evidence of abnormal metabolism in the right ventricle.  4. Overall findings are consistent with relapse of disease (Sarcoidosis).  06/11/2022 QuantiFERON gold: NEGATIVE 09/05/2022 PET/CT myocardial ([facility]):1. There are several segments with hypermetabolic activity suggestive  of an active inflammatory process in the left ventricular myocardium. Previously 7 out of 17 segments and now 9 out of 17 segments. 2. Approximately 58% of the left ventricle is involved with predominantly mild/moderate/severe hypermetabolic activity. 3. There is evidence of mild abnormal metabolism in the right ventricle.  10/11/2022 cardiopulmonary stress test: Spirometry pretest FEV1 2.19 L or 64% predicted, FVC 4.80 L or 94% predicted, FEV1/FVC 54%.  MVV 94 (67%) overall patient gave very good effort.  Pulsoxymeter remained 99 to 100% for the duration of the exercise.  The exercise was performed on a cycle ergometer.  The spirometry does not show significant change from 2023 spirometric values.  The test overall showed low normal functional capacity when compared to much sedentary norms.  Patient appears predominantly pulmonary limited with mixed restrictive/obstructive lung physiology.    Review of Systems A 10 point review of systems was performed and it is as noted above otherwise negative.  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 (HCC) 11/06/2021   Former smoker 11/06/2021   Shortness of breath 11/06/2021   Right bundle branch block 09/10/2018   GERD (gastroesophageal reflux disease) 06/25/2018   Sarcoidosis of lung (HCC) 06/25/2018   Chronic right shoulder pain 06/25/2018   History of migraine 06/25/2018   History of kidney stones 06/25/2018   Social History   Tobacco Use   Smoking status: Former    Packs/day: 1.00    Years: 25.00    Additional pack years: 0.00    Total pack years: 25.00    Types: Cigarettes    Quit date: 07/23/2008    Years since quitting: 14.3    Passive  exposure: Never   Smokeless tobacco: Former    Types: Chew    Quit date: 07/24/2020  Substance Use Topics   Alcohol use: Never   No  Known Allergies Current Meds  Medication Sig   Adalimumab 40 MG/0.4ML PNKT Inject 40 mg into the skin every 14 (fourteen) days.   empagliflozin (JARDIANCE) 10 MG TABS tablet Take 1 tablet (10 mg total) by mouth daily before breakfast.   levalbuterol (XOPENEX HFA) 45 MCG/ACT inhaler Inhale 2 puffs into the lungs every 6 (six) hours as needed for wheezing.   metoprolol succinate (TOPROL XL) 25 MG 24 hr tablet Take 0.5 tablets (12.5 mg total) by mouth daily.   predniSONE (DELTASONE) 10 MG tablet Take 1 tablet (10 mg total) by mouth daily with breakfast.   spironolactone (ALDACTONE) 25 MG tablet Take 0.5 tablets by mouth once.   Tiotropium Bromide-Olodaterol (STIOLTO RESPIMAT) 2.5-2.5 MCG/ACT AERS Inhale 2 puffs into the lungs daily.   Immunization History  Administered Date(s) Administered   Influenza,inj,Quad PF,6+ Mos 09/08/2012   Influenza-Unspecified 05/07/2011   Pneumococcal Polysaccharide-23 06/25/2018   Tdap 03/24/2013, 06/25/2018       Objective:   Physical Exam BP 120/78 (BP Location: Left Arm, Cuff Size: Normal)   Pulse 60   Temp 98.2 F (36.8 C)   Ht 5\' 7"  (1.702 m)   Wt 173 lb 12.8 oz (78.8 kg)   SpO2 98%   BMI 27.22 kg/m   SpO2: 98 % O2 Device: None (Room air)  GENERAL: Well-developed, well-nourished gentleman, no acute distress, fully ambulatory, no conversational dyspnea. HEAD: Normocephalic, atraumatic.  EYES: Pupils equal, round, reactive to light.  No scleral icterus.  MOUTH: Natural dentition, moist.  No thrush. NECK: Supple. No thyromegaly. Trachea midline. No JVD.  No adenopathy. PULMONARY: Good air entry bilaterally. No adventitious sounds.  CARDIOVASCULAR: S1 and S2. Regular rate and rhythm.  No rubs, murmurs or gallops heard. ABDOMEN: Benign. MUSCULOSKELETAL: No joint deformity, no clubbing, no edema.  Tenderness along the right shoulder. NEUROLOGIC: No overt focal deficit, no gait disturbance noted.  Speech is fluent. SKIN: Intact,warm,dry.  On  limited exam no rashes. PSYCH: Mood and behavior normal.      Assessment & Plan:     ICD-10-CM   1. Sarcoidosis - Pulmonary + Cardiac  D86.9    Patient currently on Humira for the same Has upcoming cardiac PET/CT at [facility]    2. Stage 2 moderate COPD by GOLD classification (HCC)  J44.9    Continue Stiolto Continue as needed levo albuterol Complete pulmonary rehab    3. Acute rhinosinusitis  J01.90    Doxycycline 100 mg twice daily x 7 days     Meds ordered this encounter  Medications   doxycycline (VIBRA-TABS) 100 MG tablet    Sig: Take 1 tablet (100 mg total) by mouth 2 (two) times daily for 7 days. Protect skin from sun exposure while on the medication.    Dispense:  14 tablet    Refill:  0   Overall Steve Wang is doing well.  We will see the patient in follow-up in 3 months time.  Will follow-up on the results from the cardiac PET/CT being obtained at [facility].  He had missed several appointments for PFTs, we will get these rescheduled for prior to his follow-up visit in 3 months.  He is to contact us prior to his visit should any new difficulties arise.  Steve Shelter, MD Advanced Bronchoscopy PCCM [facility] [place]    *This note was dictated  using voice recognition software/Dragon.  Despite best efforts to proofread, errors can occur which can change the meaning. Any transcriptional errors that result from this process are unintentional and may not be fully corrected at the time of dictation.

## 2022-12-12 ENCOUNTER — Encounter: Payer: BC Managed Care – PPO | Admitting: *Deleted

## 2022-12-12 DIAGNOSIS — J449 Chronic obstructive pulmonary disease, unspecified: Secondary | ICD-10-CM | POA: Diagnosis not present

## 2022-12-12 NOTE — Progress Notes (Signed)
Daily Session Note  Patient Details  Name: Steve Wang MRN: [id number] Date of Birth: [birth year] Referring Provider:    Encounter Date: 12/12/2022  Check In:  Session Check In - 12/12/22 1719       Check-In   Supervising physician immediately available to respond to emergencies See telemetry face sheet for immediately available ER MD    Location [facility]-Cardiac & Pulmonary Rehab    Staff Present Lanny Hurst, RN, Franki Monte, BS, ACSM CEP, Exercise Physiologist;Joseph Reino [place], [place]    Virtual Visit No    Medication changes reported     No    Fall or balance concerns reported    No    Warm-up and Cool-down Performed on first and last piece of equipment    Resistance Training Performed Yes    VAD Patient? No    PAD/SET Patient? No      Pain Assessment   Currently in Pain? No/denies                Social History   Tobacco Use  Smoking Status Former   Packs/day: 1.00   Years: 25.00   Additional pack years: 0.00   Total pack years: 25.00   Types: Cigarettes   Quit date: 07/23/2008   Years since quitting: 14.3   Passive exposure: Never  Smokeless Tobacco Former   Types: Chew   Quit date: 07/24/2020    Goals Met:  Independence with exercise equipment Exercise tolerated well No report of concerns or symptoms today Strength training completed today  Goals Unmet:  Not Applicable  Comments: Pt able to follow exercise prescription today without complaint.  Will continue to monitor for progression.    Dr. Bethann Punches is Medical Director for [facility] Cardiac Rehabilitation.  Dr. Vida Rigger is Medical Director for [facility] Pulmonary Rehabilitation.

## 2022-12-20 ENCOUNTER — Encounter: Payer: BC Managed Care – PPO | Admitting: *Deleted

## 2022-12-20 DIAGNOSIS — J449 Chronic obstructive pulmonary disease, unspecified: Secondary | ICD-10-CM

## 2022-12-20 NOTE — Progress Notes (Signed)
Daily Session Note  Patient Details  Name: Steve Wang MRN: [id number] Date of Birth: 06/20/1971 Referring Provider:    Encounter Date: 12/20/2022  Check In:  Session Check In - 12/20/22 1716       Check-In   Supervising physician immediately available to respond to emergencies See telemetry face sheet for immediately available ER MD    Location [facility]Cardiac [facility] Pulmonary Re[facility]    Staff Present Susann Givens, RN BSN;Joseph Reino [place], RCP,RRT,BSRT;Noah [place], [place], Exercise Physiologist    Virtual Visit No    Medication changes reported     No    Fall or balance concerns reported    No    Warm-up and Cool-down Performed on first and last piece of equipment    Resistance Training Performed Yes    VAD Patient? No    PAD/SET Patient? No      Pain Assessment   Currently in Pain? No/denies                Social History   Tobacco Use  Smoking Status Former   Packs/day: 1.00   Years: 25.00   Additional pack years: 0.00   Total pack years: 25.00   Types: Cigarettes   Quit date: 07/23/2008   Years since quitting: 14.4   Passive exposure: Never  Smokeless Tobacco Former   Types: Chew   Quit date: 07/24/2020    Goals Met:  Independence with exercise equipment Exercise tolerated well No report of concerns or symptoms today Strength training completed today  Goals Unmet:  Not Applicable  Comments: Pt able to follow exercise prescription today without complaint.  Will continue to monitor for progression.    Dr. Bethann Punches is Medical Director for [facility] Cardiac Rehabilitation.  Dr. Vida Rigger is Medical Director for [facility] Pulmonary Rehabilitation.

## 2022-12-26 ENCOUNTER — Encounter: Payer: BC Managed Care – PPO | Attending: Pulmonary Disease | Admitting: *Deleted

## 2022-12-26 DIAGNOSIS — J449 Chronic obstructive pulmonary disease, unspecified: Secondary | ICD-10-CM | POA: Diagnosis not present

## 2022-12-26 NOTE — Progress Notes (Signed)
Daily Session Note  Patient Details  Name: Keyson Hartzel MRN: [id number] Date of Birth: 10-Sep-1970 Referring Provider:    Encounter Date: 12/26/2022  Check In:  Session Check In - 12/26/22 1731       Check-In   Supervising physician immediately available to respond to emergencies See telemetry face sheet for immediately available ER MD    Location [facility]Cardiac [facility] Pulmonary Rehab    Staff Present Cora Collum, RN, BSN, CCRP;Joseph Cromwell, RCP,RRT,BSRT;Joye Wesenberg Karleen Hampshire RN, BSN    Virtual Visit No    Medication changes reported     No    Fall or balance concerns reported    No    Warm-up and Cool-down Performed on first and last piece of equipment    Resistance Training Performed Yes    VAD Patient? No    PAD/SET Patient? No      Pain Assessment   Currently in Pain? No/denies                Social History   Tobacco Use  Smoking Status Former   Packs/day: 1.00   Years: 25.00   Additional pack years: 0.00   Total pack years: 25.00   Types: Cigarettes   Quit date: 07/23/2008   Years since quitting: 14.4   Passive exposure: Never  Smokeless Tobacco Former   Types: Chew   Quit date: 07/24/2020    Goals Met:  Independence with exercise equipment Exercise tolerated well No report of concerns or symptoms today Strength training completed today  Goals Unmet:  Not Applicable  Comments: Pt able to follow exercise prescription today without complaint.  Will continue to monitor for progression.    Dr. Bethann Punches is Medical Director for [facility] Cardiac Rehabilitation.  Dr. Vida Rigger is Medical Director for [facility] Pulmonary Rehabilitation.

## 2023-01-01 ENCOUNTER — Encounter: Payer: Self-pay | Admitting: Internal Medicine

## 2023-01-02 ENCOUNTER — Encounter: Payer: Self-pay | Admitting: *Deleted

## 2023-01-02 DIAGNOSIS — J449 Chronic obstructive pulmonary disease, unspecified: Secondary | ICD-10-CM

## 2023-01-02 NOTE — Progress Notes (Signed)
Pulmonary Individual Treatment Plan  Patient Details  Name: Steve Wang MRN: [id number] Date of Birth: February 18, 1971 Referring Provider:    Initial Encounter Date:  Flowsheet Row Pulmonary Rehab from 11/08/2022 in [facility] Re[facility]  Date 11/08/22       Visit Diagnosis: Stage 2 moderate COPD by GOLD classification (HCC)  Patient's Home Medications on Admission:  Current Outpatient Medications:    Adalimumab 40 MG/0.4ML PNKT, Inject 40 mg into the skin every 14 (fourteen) days., Disp: 6 each, Rfl: 0   benzonatate (TESSALON) 200 MG capsule, Take 1 capsule (200 mg total) by mouth 3 (three) times daily as needed for cough. (Patient not taking: Reported on 10/30/2022), Disp: 30 capsule, Rfl: 0   Budeson-Glycopyrrol-Formoterol (BREZTRI AEROSPHERE) 160-9-4.8 MCG/ACT AERO, Inhale 2 puffs into the lungs in the morning and at bedtime. (Patient not taking: Reported on 10/30/2022), Disp: 5.9 g, Rfl: 0   co-enzyme Q-10 30 MG capsule, Take 30 mg by mouth daily. (Patient not taking: Reported on 10/31/2022), Disp: , Rfl:    empagliflozin (JARDIANCE) 10 MG TABS tablet, Take 1 tablet (10 mg total) by mouth daily before breakfast., Disp: 90 tablet, Rfl: 3   levalbuterol (XOPENEX HFA) 45 MCG/ACT inhaler, Inhale 2 puffs into the lungs every 6 (six) hours as needed for wheezing., Disp: 1 each, Rfl: 12   metoprolol succinate (TOPROL XL) 25 MG 24 hr tablet, Take 0.5 tablets (12.5 mg total) by mouth daily., Disp: 45 tablet, Rfl: 3   naproxen sodium (ANAPROX DS) 550 MG tablet, Take 1 tablet (550 mg total) by mouth 2 (two) times daily with a meal. (Patient not taking: Reported on 06/11/2022), Disp: 60 tablet, Rfl: 1   predniSONE (DELTASONE) 10 MG tablet, Take 1 tablet (10 mg total) by mouth daily with breakfast., Disp: 90 tablet, Rfl: 0   sacubitril-valsartan (ENTRESTO) 24-26 MG, Take 0.5 tablets by mouth 2 (two) times daily. (Patient not taking: Reported on 06/11/2022), Disp: 90 tablet, Rfl: 3    spironolactone (ALDACTONE) 25 MG tablet, Take 0.5 tablets by mouth once., Disp: , Rfl:    Tiotropium Bromide-Olodaterol (STIOLTO RESPIMAT) 2.5-2.5 MCG/ACT AERS, Inhale 2 puffs into the lungs daily., Disp: 4 g, Rfl: 11 No current facility-administered medications for this visit.  Facility-Administered Medications Ordered in Other Visits:    albuterol (PROVENTIL) (2.5 MG/3ML) 0.083% nebulizer solution 2.5 mg, 2.5 mg, Nebulization, Once, Salena Saner, MD  Past Medical History: Past Medical History:  Diagnosis Date   COPD (chronic obstructive pulmonary disease) (HCC)    Emphysema of lung (HCC)    GERD (gastroesophageal reflux disease)    HFrEF (heart failure with reduced ejection fraction) (HCC)    a. 09/2018 Echo: EF 55-60%; b. 08/2020 Echo: EF 45-50%, grade 2 diastolic dysfunction; c. 10/2020 cMRI: EF 36%, mild to mod dil LV. Mod red RV fxn. No LGE/evidence of sarcoid. No signif valvular dzs   NICM (nonischemic cardiomyopathy) (HCC)    a. 09/2018 Echo: EF 55-60%; b. 08/2020 Echo: EF 45-50%; c. 10/2020 cMRI: EF 36%, mild to mod dil LV. Mod red RV fxn. No LGE/evidence of sarcoid. No signif valvular dzs; d. 12/2020 Cor CTA: Ca2+= 0. Nl cors.   Pulmonary nodule    a. 12/2020 CT chest: 1.3cm posterior basal LUL nodule and 3mm LLL nodule - unchanged.   RBBB    Sarcoidosis    a. Dx 10/2020 in [place] - prev on steroids/methotrexate.    Tobacco Use: Social History   Tobacco Use  Smoking Status Former   Packs/day:  1.00   Years: 25.00   Additional pack years: 0.00   Total pack years: 25.00   Types: Cigarettes   Quit date: 07/23/2008   Years since quitting: 14.4   Passive exposure: Never  Smokeless Tobacco Former   Types: Chew   Quit date: 07/24/2020    Labs: Review Flowsheet       Latest Ref Rng & Units 06/25/2018  Labs for [facility] Cardiac and Pulmonary Rehab  Cholestrol 100 - 199 mg/dL 161   LDL (calc) 0 - 99 mg/dL 84   HDL-C >09 mg/dL 57   Trlycerides 0 - 604 mg/dL 70      Pulmonary  Assessment Scores:  Pulmonary Assessment Scores     Row Name 11/08/22 1201 12/27/22 1747       ADL UCSD   ADL Phase Entry Exit    SOB Score total 11 14    Rest 0 0    Walk 0 0    Stairs 3 3    Bath 0 0    Dress 0 0    Shop 0 0      CAT Score   CAT Score 13 16      mMRC Score   mMRC Score 1 --             UCSD: Self-administered rating of dyspnea associated with activities of daily living (ADLs) 6-point scale (0 = "not at all" to 5 = "maximal or unable to do because of breathlessness")  Scoring Scores range from 0 to 120.  Minimally important difference is 5 units  CAT: CAT can identify the health impairment of COPD patients and is better correlated with disease progression.  CAT has a scoring range of zero to 40. The CAT score is classified into four groups of low (less than 10), medium (10 - 20), high (21-30) and very high (31-40) based on the impact level of disease on health status. A CAT score over 10 suggests significant symptoms.  A worsening CAT score could be explained by an exacerbation, poor medication adherence, poor inhaler technique, or progression of COPD or comorbid conditions.  CAT MCID is 2 points  mMRC: mMRC (Modified Medical Research Council) Dyspnea Scale is used to assess the degree of baseline functional disability in patients of respiratory disease due to dyspnea. No minimal important difference is established. A decrease in score of 1 point or greater is considered a positive change.   Pulmonary Function Assessment:  Pulmonary Function Assessment - 11/01/22 1010       Pulmonary Function Tests   FEV1% 63 %    FEV1/FVC Ratio 58      Breath   Shortness of Breath Yes;Limiting activity             Exercise Target Goals: Exercise Program Goal: Individual exercise prescription set using results from initial 6 min walk test and THRR while considering  patient's activity barriers and safety.   Exercise Prescription Goal: Initial exercise  prescription builds to 30-45 minutes a day of aerobic activity, 2-3 days per week.  Home exercise guidelines will be given to patient during program as part of exercise prescription that the participant will acknowledge.  Education: Aerobic Exercise: - Group verbal and visual presentation on the components of exercise prescription. Introduces F.I.T.T principle from ACSM for exercise prescriptions.  Reviews F.I.T.T. principles of aerobic exercise including progression. Written material given at graduation.   Education: Resistance Exercise: - Group verbal and visual presentation on the components of exercise prescription. Introduces  F.I.T.T principle from ACSM for exercise prescriptions  Reviews F.I.T.T. principles of resistance exercise including progression. Written material given at graduation.    Education: Exercise & Equipment Safety: - Individual verbal instruction and demonstration of equipment use and safety with use of the equipment. Flowsheet Row Pulmonary Rehab from 11/01/2022 in [facility] Card[facility] Pulmonary Rehab  Date 11/01/22  Educator jh  Instruction Review Code 1- Verbalizes Understanding       Education: Exercise Physiology & General Exercise Guidelines: - Group verbal and written instruction with models to review the exercise physiology of the cardiovascular system and associated critical values. Provides general exercise guidelines with specific guidelines to those with heart or lung disease.    Education: Flexibility, Balance, Mind/Body Relaxation: - Group verbal and visual presentation with interactive activity on the components of exercise prescription. Introduces F.I.T.T principle from ACSM for exercise prescriptions. Reviews F.I.T.T. principles of flexibility and balance exercise training including progression. Also discusses the mind body connection.  Reviews various relaxation techniques to help reduce and manage stress (i.e. Deep breathing, progressive muscle  relaxation, and visualization). Balance handout provided to take home. Written material given at graduation.   Activity Barriers & Risk Stratification:  Activity Barriers & Cardiac Risk Stratification - 11/08/22 1303       Activity Barriers & Cardiac Risk Stratification   Activity Barriers Shortness of Breath;Other (comment)    Comments Overall joint and muscle pain             6 Minute Walk:  6 Minute Walk     Row Name 11/08/22 1253         6 Minute Walk   Phase Initial     Distance 1505 feet     Walk Time 6 minutes     # of Rest Breaks 0     MPH 2.85     METS 4.19     RPE 7     Perceived Dyspnea  0     VO2 Peak 14.67     Symptoms No     Resting HR 62 bpm     Resting BP 110/68     Resting Oxygen Saturation  96 %     Exercise Oxygen Saturation  during 6 min walk 93 %     Max Ex. HR 90 bpm     Max Ex. BP 126/70     2 Minute Post BP 114/68       Interval HR   1 Minute HR 74     2 Minute HR 80     3 Minute HR 85     4 Minute HR 87     5 Minute HR 86     6 Minute HR 90     2 Minute Post HR 61     Interval Heart Rate? Yes       Interval Oxygen   Interval Oxygen? Yes     Baseline Oxygen Saturation % 96 %     1 Minute Oxygen Saturation % 95 %     1 Minute Liters of Oxygen 0 L  RA     2 Minute Oxygen Saturation % 94 %     2 Minute Liters of Oxygen 0 L     3 Minute Oxygen Saturation % 93 %     3 Minute Liters of Oxygen 0 L     4 Minute Oxygen Saturation % 94 %     4 Minute Liters of Oxygen 0 L  5 Minute Oxygen Saturation % 95 %     5 Minute Liters of Oxygen 0 L     6 Minute Oxygen Saturation % 95 %     6 Minute Liters of Oxygen 0 L     2 Minute Post Oxygen Saturation % 96 %     2 Minute Post Liters of Oxygen 0 L             Oxygen Initial Assessment:  Oxygen Initial Assessment - 11/08/22 1200       Home Oxygen   Home Oxygen Device None    Sleep Oxygen Prescription None    Home Exercise Oxygen Prescription None    Home Resting Oxygen  Prescription None      Initial 6 min Walk   Oxygen Used None      Program Oxygen Prescription   Program Oxygen Prescription None      Intervention   Short Term Goals To learn and demonstrate proper use of respiratory medications;To learn and understand importance of maintaining oxygen saturations>88%;To learn and demonstrate proper pursed lip breathing techniques or other breathing techniques. ;To learn and understand importance of monitoring SPO2 with pulse oximeter and demonstrate accurate use of the pulse oximeter.;To learn and exhibit compliance with exercise, home and travel O2 prescription    Long  Term Goals Exhibits compliance with exercise, home  and travel O2 prescription;Maintenance of O2 saturations>88%;Compliance with respiratory medication;Verbalizes importance of monitoring SPO2 with pulse oximeter and return demonstration;Exhibits proper breathing techniques, such as pursed lip breathing or other method taught during program session;Demonstrates proper use of MDI's             Oxygen Re-Evaluation:  Oxygen Re-Evaluation     Row Name 11/14/22 1741 11/28/22 1729           Program Oxygen Prescription   Program Oxygen Prescription -- None        Home Oxygen   Home Oxygen Device -- None      Sleep Oxygen Prescription -- None      Home Exercise Oxygen Prescription -- None      Home Resting Oxygen Prescription -- None        Goals/Expected Outcomes   Short Term Goals -- To learn and understand importance of maintaining oxygen saturations>88%;To learn and understand importance of monitoring SPO2 with pulse oximeter and demonstrate accurate use of the pulse oximeter.      Long  Term Goals -- Maintenance of O2 saturations>88%;Verbalizes importance of monitoring SPO2 with pulse oximeter and return demonstration      Comments Reviewed PLB technique with pt.  Talked about how it works and it's importance in maintaining their exercise saturations. --      Goals/Expected  Outcomes Short: Become more profiecient at using PLB.   Long: Become independent at using PLB. --               Oxygen Discharge (Final Oxygen Re-Evaluation):  Oxygen Re-Evaluation - 11/28/22 1729       Program Oxygen Prescription   Program Oxygen Prescription None      Home Oxygen   Home Oxygen Device None    Sleep Oxygen Prescription None    Home Exercise Oxygen Prescription None    Home Resting Oxygen Prescription None      Goals/Expected Outcomes   Short Term Goals To learn and understand importance of maintaining oxygen saturations>88%;To learn and understand importance of monitoring SPO2 with pulse oximeter and demonstrate accurate use of the pulse oximeter.  Long  Term Goals Maintenance of O2 saturations>88%;Verbalizes importance of monitoring SPO2 with pulse oximeter and return demonstration             Initial Exercise Prescription:  Initial Exercise Prescription - 11/08/22 1200       Date of Initial Exercise RX and Referring Provider   Date 11/08/22      Oxygen   Maintain Oxygen Saturation 88% or higher      Treadmill   MPH 2.9    Grade 1    Minutes 15    METs 3.62      Recumbant Bike   Level 3    Watts 35    Minutes 15    METs 4      Recumbant Elliptical   Level 2.5    Minutes 15    METs 4      REL-XR   Level 3    Minutes 15    METs 4      Prescription Details   Frequency (times per week) 2    Duration Progress to 30 minutes of continuous aerobic without signs/symptoms of physical distress      Intensity   THRR 40-80% of Max Heartrate 104 - 147    Ratings of Perceived Exertion 11-13    Perceived Dyspnea 0-4      Progression   Progression Continue to progress workloads to maintain intensity without signs/symptoms of physical distress.      Resistance Training   Training Prescription Yes    Weight 5 lb    Reps 10-15             Perform Capillary Blood Glucose checks as needed.  Exercise Prescription Changes:    Exercise Prescription Changes     Row Name 11/08/22 1200 11/19/22 1400 11/21/22 1800 12/03/22 1300 12/18/22 0700     Response to Exercise   Blood Pressure (Admit) 110/68 108/72 -- 104/60 98/58   Blood Pressure (Exercise) 126/70 132/60 -- 112/64 132/72   Blood Pressure (Exit) 112/70 104/70 -- 106/68 102/64   Heart Rate (Admit) 62 bpm 74 bpm -- 80 bpm 71 bpm   Heart Rate (Exercise) 90 bpm 105 bpm -- 107 bpm 108 bpm   Heart Rate (Exit) 61 bpm 75 bpm -- 87 bpm 88 bpm   Oxygen Saturation (Admit) 96 % 95 % -- 96 % 96 %   Oxygen Saturation (Exercise) 93 % 94 % -- 94 % 92 %   Oxygen Saturation (Exit) 96 % 95 % -- 97 % 95 %   Rating of Perceived Exertion (Exercise) 7 11 -- 13 13   Perceived Dyspnea (Exercise) 0 1 -- 2 1   Symptoms none none -- SOB SOB   Comments walk test results First two days of exercise -- -- --   Duration -- Continue with 30 min of aerobic exercise without signs/symptoms of physical distress. -- Continue with 30 min of aerobic exercise without signs/symptoms of physical distress. Continue with 30 min of aerobic exercise without signs/symptoms of physical distress.   Intensity -- THRR unchanged -- THRR unchanged THRR unchanged     Progression   Progression -- Continue to progress workloads to maintain intensity without signs/symptoms of physical distress. -- Continue to progress workloads to maintain intensity without signs/symptoms of physical distress. Continue to progress workloads to maintain intensity without signs/symptoms of physical distress.   Average METs -- 3.94 -- 4.14 4.72     Resistance Training   Training Prescription -- Yes -- Yes Yes  Weight -- 5 lb -- 7 lb 7 lb   Reps -- 10-15 -- 10-15 10-15     Interval Training   Interval Training -- No -- No No     Treadmill   MPH -- 2.8 -- 3.1 3.1   Grade -- 2 -- 2.5 4   Minutes -- 15 -- 15 15   METs -- 3.91 -- 4.44 5.08     Recumbant Bike   Level -- 5 -- 6 7   Watts -- 33 -- 65 51   Minutes -- 15 -- 15 15    METs -- 3.44 -- 4.73 4.07     NuStep   Level -- -- -- 1 --   Minutes -- -- -- 15 --   METs -- -- -- 4.44 --     REL-XR   Level -- 5 -- 4 8   Minutes -- 15 -- 15 15   METs -- 4.8 -- 4.5 5.6     Home Exercise Plan   Plans to continue exercise at -- -- Home (comment)  walking, treadmill, stationary bike, weights Home (comment)  walking, treadmill, stationary bike, weights Home (comment)  walking, treadmill, stationary bike, weights   Frequency -- -- Add 2 additional days to program exercise sessions. Add 2 additional days to program exercise sessions. Add 2 additional days to program exercise sessions.   Initial Home Exercises Provided -- -- 11/21/22 11/21/22 11/21/22     Oxygen   Maintain Oxygen Saturation -- 88% or higher 88% or higher 88% or higher 88% or higher    Row Name 12/31/22 1500             Response to Exercise   Blood Pressure (Admit) 98/60       Blood Pressure (Exit) 102/62       Heart Rate (Admit) 90 bpm       Heart Rate (Exercise) 102 bpm       Heart Rate (Exit) 94 bpm       Oxygen Saturation (Admit) 95 %       Oxygen Saturation (Exercise) 95 %       Oxygen Saturation (Exit) 95 %       Rating of Perceived Exertion (Exercise) 12       Perceived Dyspnea (Exercise) 2       Symptoms SOB       Duration Continue with 30 min of aerobic exercise without signs/symptoms of physical distress.       Intensity THRR unchanged         Progression   Progression Continue to progress workloads to maintain intensity without signs/symptoms of physical distress.       Average METs 4.88         Resistance Training   Training Prescription Yes       Weight 7 lb       Reps 10-15         Interval Training   Interval Training No         Treadmill   MPH 3.2       Grade 5       Minutes 15       METs 5.66         Recumbant Bike   Level 7       Watts 51       Minutes 15       METs 4.1         Home Exercise Plan   Plans  to continue exercise at Home (comment)   walking, treadmill, stationary bike, weights       Frequency Add 2 additional days to program exercise sessions.       Initial Home Exercises Provided 11/21/22         Oxygen   Maintain Oxygen Saturation 88% or higher                Exercise Comments:   Exercise Comments     Row Name 11/14/22 1739           Exercise Comments First full day of exercise!  Patient was oriented to gym and equipment including functions, settings, policies, and procedures.  Patient's individual exercise prescription and treatment plan were reviewed.  All starting workloads were established based on the results of the 6 minute walk test done at initial orientation visit.  The plan for exercise progression was also introduced and progression will be customized based on patient's performance and goals.                Exercise Goals and Review:   Exercise Goals     Row Name 11/08/22 1301             Exercise Goals   Increase Physical Activity Yes       Intervention Develop an individualized exercise prescription for aerobic and resistive training based on initial evaluation findings, risk stratification, comorbidities and participant's personal goals.;Provide advice, education, support and counseling about physical activity/exercise needs.       Expected Outcomes Short Term: Attend rehab on a regular basis to increase amount of physical activity.;Long Term: Add in home exercise to make exercise part of routine and to increase amount of physical activity.;Long Term: Exercising regularly at least 3-5 days a week.       Increase Strength and Stamina Yes       Intervention Provide advice, education, support and counseling about physical activity/exercise needs.;Develop an individualized exercise prescription for aerobic and resistive training based on initial evaluation findings, risk stratification, comorbidities and participant's personal goals.       Expected Outcomes Short Term: Increase workloads  from initial exercise prescription for resistance, speed, and METs.;Short Term: Perform resistance training exercises routinely during rehab and add in resistance training at home;Long Term: Improve cardiorespiratory fitness, muscular endurance and strength as measured by increased METs and functional capacity ( )       Able to understand and use rate of perceived exertion (RPE) scale Yes       Intervention Provide education and explanation on how to use RPE scale       Expected Outcomes Short Term: Able to use RPE daily in rehab to express subjective intensity level;Long Term:  Able to use RPE to guide intensity level when exercising independently       Able to understand and use Dyspnea scale Yes       Intervention Provide education and explanation on how to use Dyspnea scale       Expected Outcomes Short Term: Able to use Dyspnea scale daily in rehab to express subjective sense of shortness of breath during exertion;Long Term: Able to use Dyspnea scale to guide intensity level when exercising independently       Knowledge and understanding of Target Heart Rate Range (THRR) Yes       Intervention Provide education and explanation of THRR including how the numbers were predicted and where they are located for reference       Expected Outcomes  Short Term: Able to state/look up THRR;Long Term: Able to use THRR to govern intensity when exercising independently;Short Term: Able to use daily as guideline for intensity in rehab       Able to check pulse independently Yes       Intervention Provide education and demonstration on how to check pulse in carotid and radial arteries.;Review the importance of being able to check your own pulse for safety during independent exercise       Expected Outcomes Long Term: Able to check pulse independently and accurately;Short Term: Able to explain why pulse checking is important during independent exercise       Understanding of Exercise Prescription Yes        Intervention Provide education, explanation, and written materials on patient's individual exercise prescription       Expected Outcomes Short Term: Able to explain program exercise prescription;Long Term: Able to explain home exercise prescription to exercise independently                Exercise Goals Re-Evaluation :  Exercise Goals Re-Evaluation     Row Name 11/14/22 1740 11/19/22 1501 11/21/22 1806 12/03/22 1317 12/18/22 0751     Exercise Goal Re-Evaluation   Exercise Goals Review Increase Physical Activity;Able to understand and use rate of perceived exertion (RPE) scale;Knowledge and understanding of Target Heart Rate Range (THRR);Understanding of Exercise Prescription;Increase Strength and Stamina;Able to check pulse independently;Able to understand and use Dyspnea scale Increase Physical Activity;Increase Strength and Stamina;Understanding of Exercise Prescription Increase Physical Activity;Increase Strength and Stamina;Understanding of Exercise Prescription;Able to understand and use Dyspnea scale;Able to check pulse independently;Able to understand and use rate of perceived exertion (RPE) scale;Knowledge and understanding of Target Heart Rate Range (THRR) Increase Strength and Stamina;Increase Physical Activity;Understanding of Exercise Prescription Increase Strength and Stamina;Increase Physical Activity;Understanding of Exercise Prescription   Comments Reviewed RPE scale, THR and program prescription with pt today.  Pt voiced understanding and was given a copy of goals to take home. Kristiano is off to a good start in the program. He was able to work up to a speed of 2.8 mph with a 2% incline on the treadmill during his first two sessions. He also worked at level 5 on both the XR and recumbent bike. He did well with 5 lb hand weights for resistance training as well. We will continue to monitor his progress in the program. Reviewed home exercise with pt today.  Pt plans to walk outside and on  the treadmill for exercise.  He also has weights at home and a stationary bike to use. HE had been using them but hasn't for the last week. Encouraged to add on 1 day of structured exercise at home. Explain pysical activity versus being physically active. Reviewed THR, pulse, RPE, sign and symptoms, pulse oximetery and when to call 911 or MD.  Also discussed weather considerations and indoor options.  Pt voiced understanding. Atiksh continues to do well in rehab. He was able to increase to level 6 on the recumbent bike working over 65 watts! He also increased his workload on the treadmill to 3.1/2.5%- all working with appropriate RPEs. Dantavius is now using 7 lb for handweights . He has been reaching his THR some sessions so far. Will continue to monitor. Niranjan continues to do well in rehab. He recently increased his overall average MET level to 4.72 METs. He also improved to level 8 on the XR. He increased his workload on the treadmill as well, to an incline  of 4% while maintaining his speed at 3.1 mph. We will continue to monitor his progress in the program.   Expected Outcomes Short: Use RPE daily to regulate intensity.  Long: Follow program prescription in THR. Short: Continue to progressively increase workloads. Long: Continue to improve strength and stamina. Short: Add on 1 day of structred exercise Long: Continue to exercise indepedently at home at appropriate prescription Short: Continue working up watts on recumbent bike Long: Continue to increase overall MET level and stmaina Short: Continue to progressively increase treadmill workload. Long: Continue to improve strength and stamina.    Row Name 12/31/22 1532             Exercise Goal Re-Evaluation   Exercise Goals Review Increase Strength and Stamina;Increase Physical Activity;Understanding of Exercise Prescription       Comments Ashtun is set to graduate this week per his request.  We expect to see an improvement in his post .  We will  conitinue to encourage him to exercise on his own.       Expected Outcomes Short: Graduate and improve post Long: Continue to exercise indpendently                Discharge Exercise Prescription (Final Exercise Prescription Changes):  Exercise Prescription Changes - 12/31/22 1500       Response to Exercise   Blood Pressure (Admit) 98/60    Blood Pressure (Exit) 102/62    Heart Rate (Admit) 90 bpm    Heart Rate (Exercise) 102 bpm    Heart Rate (Exit) 94 bpm    Oxygen Saturation (Admit) 95 %    Oxygen Saturation (Exercise) 95 %    Oxygen Saturation (Exit) 95 %    Rating of Perceived Exertion (Exercise) 12    Perceived Dyspnea (Exercise) 2    Symptoms SOB    Duration Continue with 30 min of aerobic exercise without signs/symptoms of physical distress.    Intensity THRR unchanged      Progression   Progression Continue to progress workloads to maintain intensity without signs/symptoms of physical distress.    Average METs 4.88      Resistance Training   Training Prescription Yes    Weight 7 lb    Reps 10-15      Interval Training   Interval Training No      Treadmill   MPH 3.2    Grade 5    Minutes 15    METs 5.66      Recumbant Bike   Level 7    Watts 51    Minutes 15    METs 4.1      Home Exercise Plan   Plans to continue exercise at Home (comment)   walking, treadmill, stationary bike, weights   Frequency Add 2 additional days to program exercise sessions.    Initial Home Exercises Provided 11/21/22      Oxygen   Maintain Oxygen Saturation 88% or higher             Nutrition:  Target Goals: Understanding of nutrition guidelines, daily intake of sodium 1500mg , cholesterol 200mg , calories 30% from fat and 7% or less from saturated fats, daily to have 5 or more servings of fruits and vegetables.  Education: All About Nutrition: -Group instruction provided by verbal, written material, interactive activities, discussions, models, and posters to  present general guidelines for heart healthy nutrition including fat, fiber, MyPlate, the role of sodium in heart healthy nutrition, utilization of the nutrition label,  and utilization of this knowledge for meal planning. Follow up email sent as well. Written material given at graduation.   Biometrics:  Pre Biometrics - 11/08/22 1303       Pre Biometrics   Height 5\' 7"  (1.702 m)    Weight 171 lb 14.4 oz (78 kg)    Waist Circumference --   declined   Hip Circumference --   declined   BMI (Calculated) 26.92    Single Leg Stand 30 seconds              Nutrition Therapy Plan and Nutrition Goals:  Nutrition Therapy & Goals - 11/08/22 1158       Nutrition Therapy   RD appointment deferred Yes   Deferred     Intervention Plan   Intervention Prescribe, educate and counsel regarding individualized specific dietary modifications aiming towards targeted core components such as weight, hypertension, lipid management, diabetes, heart failure and other comorbidities.    Expected Outcomes Short Term Goal: Understand basic principles of dietary content, such as calories, fat, sodium, cholesterol and nutrients.;Short Term Goal: A plan has been developed with personal nutrition goals set during dietitian appointment.;Long Term Goal: Adherence to prescribed nutrition plan.             Nutrition Assessments:  MEDIFICTS Score Key: ?70 Need to make dietary changes  40-70 Heart Healthy Diet ? 40 Therapeutic Level Cholesterol Diet  Flowsheet Row Pulmonary Rehab from 12/26/2022 in [facility] Cardiac and Pulmonary Rehab  Picture Your Plate Total Score on Admission 53      Picture Your Plate Scores: <08 Unhealthy dietary pattern with much room for improvement. 41-50 Dietary pattern unlikely to meet recommendations for good health and room for improvement. 51-60 More healthful dietary pattern, with some room for improvement.  >60 Healthy dietary pattern, although there may be some specific  behaviors that could be improved.   Nutrition Goals Re-Evaluation:  Nutrition Goals Re-Evaluation     Row Name 11/28/22 1730             Goals   Current Weight 169 lb (76.7 kg)       Nutrition Goal eat smaller portions.       Comment Patient was informed on why it is important to maintain a balanced diet when dealing with Respiratory issues. Explained that it takes a lot of energy to breath and when they are short of breath often they will need to have a good diet to help keep up with the calories they are expending for breathing.       Expected Outcome Short: Choose and plan snacks accordingly to patients caloric intake to improve breathing. Long: Maintain a diet independently that meets their caloric intake to aid in daily shortness of breath.                Nutrition Goals Discharge (Final Nutrition Goals Re-Evaluation):  Nutrition Goals Re-Evaluation - 11/28/22 1730       Goals   Current Weight 169 lb (76.7 kg)    Nutrition Goal eat smaller portions.    Comment Patient was informed on why it is important to maintain a balanced diet when dealing with Respiratory issues. Explained that it takes a lot of energy to breath and when they are short of breath often they will need to have a good diet to help keep up with the calories they are expending for breathing.    Expected Outcome Short: Choose and plan snacks accordingly to patients caloric intake to  improve breathing. Long: Maintain a diet independently that meets their caloric intake to aid in daily shortness of breath.             Psychosocial: Target Goals: Acknowledge presence or absence of significant depression and/or stress, maximize coping skills, provide positive support system. Participant is able to verbalize types and ability to use techniques and skills needed for reducing stress and depression.   Education: Stress, Anxiety, and Depression - Group verbal and visual presentation to define topics covered.   Reviews how body is impacted by stress, anxiety, and depression.  Also discusses healthy ways to reduce stress and to treat/manage anxiety and depression.  Written material given at graduation.   Education: Sleep Hygiene -Provides group verbal and written instruction about how sleep can affect your health.  Define sleep hygiene, discuss sleep cycles and impact of sleep habits. Review good sleep hygiene tips.    Initial Review & Psychosocial Screening:  Initial Psych Review & Screening - 11/01/22 1011       Initial Review   Current issues with Current Depression;History of Depression;Current Stress Concerns;Current Sleep Concerns    Source of Stress Concerns Chronic Illness    Comments His health issues get him down sometimes but does not want or need to take medication for it.  He sometimes is worried to go to sleep with fear that he will not wake up with saroidosis of the heart.      Family Dynamics   Good Support System? Yes    Comments He can look to his wife for support.             Quality of Life Scores:  Scores of 19 and below usually indicate a poorer quality of life in these areas.  A difference of  2-3 points is a clinically meaningful difference.  A difference of 2-3 points in the total score of the Quality of Life Index has been associated with significant improvement in overall quality of life, self-image, physical symptoms, and general health in studies assessing change in quality of life.  PHQ-9: Review Flowsheet       12/27/2022 11/08/2022 08/05/2018 06/25/2018  Depression screen PHQ 2/9  Decreased Interest 0 0 0 0  Down, Depressed, Hopeless 1 0 - 0  PHQ - 2 Score 1 0 0 0  Altered sleeping 0 1 1 1   Tired, decreased energy 1 1 1 1   Change in appetite 0 0 0 0  Feeling bad or failure about yourself  1 0 0 0  Trouble concentrating 0 0 0 0  Moving slowly or fidgety/restless 0 0 0 0  Suicidal thoughts 0 0 0 0  PHQ-9 Score 3 2 2 2   Difficult doing work/chores  Somewhat difficult Not difficult at all Not difficult at all Not difficult at all   Interpretation of Total Score  Total Score Depression Severity:  1-4 = Minimal depression, 5-9 = Mild depression, 10-14 = Moderate depression, 15-19 = Moderately severe depression, 20-27 = Severe depression   Psychosocial Evaluation and Intervention:  Psychosocial Evaluation - 11/01/22 1013       Psychosocial Evaluation & Interventions   Interventions Encouraged to exercise with the program and follow exercise prescription;Relaxation education;Stress management education    Comments His health issues get him down sometimes but does not want or need to take medication for it.He can look to his wife for support.He sometimes is worried to go to sleep with fear that he will not wake up with saroidosis of the  heart.    Expected Outcomes Short: Start [facility] to help with mood. Long: Maintain a healthy mental state    Continue Psychosocial Services  Follow up required by staff             Psychosocial Re-Evaluation:  Psychosocial Re-Evaluation     Row Name 11/28/22 1733             Psychosocial Re-Evaluation   Current issues with None Identified       Comments Patient reports no issues with their current mental states, sleep, stress, depression or anxiety. Will follow up with patient in a few weeks for any changes.       Expected Outcomes Short: Continue to exercise regularly to support mental health and notify staff of any changes. Long: maintain mental health and well being through teaching of rehab or prescribed medications independently.       Interventions Encouraged to attend Pulmonary Rehabilitation for the exercise       Continue Psychosocial Services  Follow up required by staff                Psychosocial Discharge (Final Psychosocial Re-Evaluation):  Psychosocial Re-Evaluation - 11/28/22 1733       Psychosocial Re-Evaluation   Current issues with None Identified    Comments  Patient reports no issues with their current mental states, sleep, stress, depression or anxiety. Will follow up with patient in a few weeks for any changes.    Expected Outcomes Short: Continue to exercise regularly to support mental health and notify staff of any changes. Long: maintain mental health and well being through teaching of rehab or prescribed medications independently.    Interventions Encouraged to attend Pulmonary Rehabilitation for the exercise    Continue Psychosocial Services  Follow up required by staff             Education: Education Goals: Education classes will be provided on a weekly basis, covering required topics. Participant will state understanding/return demonstration of topics presented.  Learning Barriers/Preferences:  Learning Barriers/Preferences - 11/01/22 1010       Learning Barriers/Preferences   Learning Barriers None    Learning Preferences None             General Pulmonary Education Topics:  Infection Prevention: - Provides verbal and written material to individual with discussion of infection control including proper hand washing and proper equipment cleaning during exercise session. Flowsheet Row Pulmonary Rehab from 11/01/2022 in [facility] Cardiac and Pulmonary Rehab  Date 11/01/22  Educator jh  Instruction Review Code 1- Verbalizes Understanding       Falls Prevention: - Provides verbal and written material to individual with discussion of falls prevention and safety. Flowsheet Row Pulmonary Rehab from 11/01/2022 in [facility] Cardiac and Pulmonary Rehab  Date 11/01/22  Educator jh  Instruction Review Code 1- Verbalizes Understanding       Chronic Lung Disease Review: - Group verbal instruction with posters, models, PowerPoint presentations and videos,  to review new updates, new respiratory medications, new advancements in procedures and treatments. Providing information on websites and "800" numbers for continued self-education.  Includes information about supplement oxygen, available portable oxygen systems, continuous and intermittent flow rates, oxygen safety, concentrators, and Medicare reimbursement for oxygen. Explanation of Pulmonary Drugs, including class, frequency, complications, importance of spacers, rinsing mouth after steroid MDI's, and proper cleaning methods for nebulizers. Review of basic lung anatomy and physiology related to function, structure, and complications of lung disease. Review of risk factors. Discussion about  methods for diagnosing sleep apnea and types of masks and machines for OSA. Includes a review of the use of types of environmental controls: home humidity, furnaces, filters, dust mite/pet prevention, HEPA vacuums. Discussion about weather changes, air quality and the benefits of nasal washing. Instruction on Warning signs, infection symptoms, calling MD promptly, preventive modes, and value of vaccinations. Review of effective airway clearance, coughing and/or vibration techniques. Emphasizing that all should Create an Action Plan. Written material given at graduation. Flowsheet Row Pulmonary Rehab from 11/08/2022 in [facility] Cardiac and Pulmonary Rehab  Education need identified 11/08/22       AED/CPR: - Group verbal and written instruction with the use of models to demonstrate the basic use of the AED with the basic ABC's of resuscitation.    Anatomy and Cardiac Procedures: - Group verbal and visual presentation and models provide information about basic cardiac anatomy and function. Reviews the testing methods done to diagnose heart disease and the outcomes of the test results. Describes the treatment choices: Medical Management, Angioplasty, or Coronary Bypass Surgery for treating various heart conditions including Myocardial Infarction, Angina, Valve Disease, and Cardiac Arrhythmias.  Written material given at graduation.   Medication Safety: - Group verbal and visual instruction to review  commonly prescribed medications for heart and lung disease. Reviews the medication, class of the drug, and side effects. Includes the steps to properly store meds and maintain the prescription regimen.  Written material given at graduation.   Other: -Provides group and verbal instruction on various topics (see comments)   Knowledge Questionnaire Score:  Knowledge Questionnaire Score - 12/27/22 1747       Knowledge Questionnaire Score   Post Score 17/18              Core Components/Risk Factors/Patient Goals at Admission:  Personal Goals and Risk Factors at Admission - 11/08/22 1302       Core Components/Risk Factors/Patient Goals on Admission    Weight Management Yes;Weight Maintenance    Intervention Weight Management: Develop a combined nutrition and exercise program designed to reach desired caloric intake, while maintaining appropriate intake of nutrient and fiber, sodium and fats, and appropriate energy expenditure required for the weight goal.;Weight Management/Obesity: Establish reasonable short term and long term weight goals.;Weight Management: Provide education and appropriate resources to help participant work on and attain dietary goals.    Admit Weight 171 lb (77.6 kg)    Goal Weight: Short Term 171 lb (77.6 kg)    Goal Weight: Long Term 171 lb (77.6 kg)    Expected Outcomes Short Term: Continue to assess and modify interventions until short term weight is achieved;Long Term: Adherence to nutrition and physical activity/exercise program aimed toward attainment of established weight goal;Weight Maintenance: Understanding of the daily nutrition guidelines, which includes 25-35% calories from fat, 7% or less cal from saturated fats, less than 200mg  cholesterol, less than 1.5gm of sodium, & 5 or more servings of fruits and vegetables daily;Understanding recommendations for meals to include 15-35% energy as protein, 25-35% energy from fat, 35-60% energy from carbohydrates, less  than 200mg  of dietary cholesterol, 20-35 gm of total fiber daily;Understanding of distribution of calorie intake throughout the day with the consumption of 4-5 meals/snacks    Improve shortness of breath with ADL's Yes    Intervention Provide education, individualized exercise plan and daily activity instruction to help decrease symptoms of SOB with activities of daily living.    Expected Outcomes Short Term: Improve cardiorespiratory fitness to achieve a reduction of symptoms  when performing ADLs;Long Term: Be able to perform more ADLs without symptoms or delay the onset of symptoms    Hypertension Yes    Intervention Provide education on lifestyle modifcations including regular physical activity/exercise, weight management, moderate sodium restriction and increased consumption of fresh fruit, vegetables, and low fat dairy, alcohol moderation, and smoking cessation.;Monitor prescription use compliance.    Expected Outcomes Short Term: Continued assessment and intervention until BP is < 140/78mm HG in hypertensive participants. < 130/67mm HG in hypertensive participants with diabetes, heart failure or chronic kidney disease.;Long Term: Maintenance of blood pressure at goal levels.             Education:Diabetes - Individual verbal and written instruction to review signs/symptoms of diabetes, desired ranges of glucose level fasting, after meals and with exercise. Acknowledge that pre and post exercise glucose checks will be done for 3 sessions at entry of program.   Know Your Numbers and Heart Failure: - Group verbal and visual instruction to discuss disease risk factors for cardiac and pulmonary disease and treatment options.  Reviews associated critical values for Overweight/Obesity, Hypertension, Cholesterol, and Diabetes.  Discusses basics of heart failure: signs/symptoms and treatments.  Introduces Heart Failure Zone chart for action plan for heart failure.  Written material given at  graduation.   Core Components/Risk Factors/Patient Goals Review:   Goals and Risk Factor Review     Row Name 11/28/22 1731             Core Components/Risk Factors/Patient Goals Review   Personal Goals Review Improve shortness of breath with ADL's       Review Spoke to patient about their shortness of breath and what they can do to improve. Patient has been informed of breathing techniques when starting the program. Patient is informed to tell staff if they have had any med changes and that certain meds they are taking or not taking can be causing shortness of breath.       Expected Outcomes Short: Attend [facility] regularly to improve shortness of breath with ADL's. Long: maintain independence with ADL's                Core Components/Risk Factors/Patient Goals at Discharge (Final Review):   Goals and Risk Factor Review - 11/28/22 1731       Core Components/Risk Factors/Patient Goals Review   Personal Goals Review Improve shortness of breath with ADL's    Review Spoke to patient about their shortness of breath and what they can do to improve. Patient has been informed of breathing techniques when starting the program. Patient is informed to tell staff if they have had any med changes and that certain meds they are taking or not taking can be causing shortness of breath.    Expected Outcomes Short: Attend [facility] regularly to improve shortness of breath with ADL's. Long: maintain independence with ADL's             ITP Comments:  ITP Comments     Row Name 11/01/22 1009 11/08/22 1157 11/14/22 1739 12/05/22 0753 01/02/23 0719   ITP Comments Virtual Visit completed. Patient informed on EP and RD appointment and 6 Minute walk test. Patient also informed of patient health questionnaires on My Chart. Patient Verbalizes understanding. Visit diagnosis can be found in [facility] 10/15/2022. Completed and gym orientation. Initial ITP created and sent for review to Dr. Vida Rigger,  Medical Director. First full day of exercise!  Patient was oriented to gym and equipment including functions, settings,  policies, and procedures.  Patient's individual exercise prescription and treatment plan were reviewed.  All starting workloads were established based on the results of the 6 minute walk test done at initial orientation visit.  The plan for exercise progression was also introduced and progression will be customized based on patient's performance and goals. 30 Day review completed. Medical Director ITP review done, changes made as directed, and signed approval by Medical Director.    New to program 30 Day review completed. Medical Director ITP review done, changes made as directed, and signed approval by Medical Director.   Out since 6/5            Comments:

## 2023-01-07 ENCOUNTER — Encounter: Payer: Self-pay | Admitting: Internal Medicine

## 2023-01-14 ENCOUNTER — Telehealth: Payer: Self-pay

## 2023-01-14 ENCOUNTER — Encounter: Payer: Self-pay | Admitting: *Deleted

## 2023-01-14 DIAGNOSIS — J449 Chronic obstructive pulmonary disease, unspecified: Secondary | ICD-10-CM

## 2023-01-14 NOTE — Progress Notes (Signed)
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 He completed 12 of 36 sessions. Unable to perform post walk test due to attendance.    6 Minute Walk     Row Name 11/08/22 1253         6 Minute Walk   Phase Initial     Distance 1505 feet     Walk Time 6 minutes     # of Rest Breaks 0     MPH 2.85     METS 4.19     RPE 7     Perceived Dyspnea  0     VO2 Peak 14.67     Symptoms No     Resting HR 62 bpm     Resting BP 110/68     Resting Oxygen Saturation  96 %     Exercise Oxygen Saturation  during 6 min walk 93 %     Max Ex. HR 90 bpm     Max Ex. BP 126/70     2 Minute Post BP 114/68       Interval HR   1 Minute HR 74     2 Minute HR 80     3 Minute HR 85     4 Minute HR 87     5 Minute HR 86     6 Minute HR 90     2 Minute Post HR 61     Interval Heart Rate? Yes       Interval Oxygen   Interval Oxygen? Yes     Baseline Oxygen Saturation % 96 %     1 Minute Oxygen Saturation % 95 %     1 Minute Liters of Oxygen 0 L  RA     2 Minute Oxygen Saturation % 94 %     2 Minute Liters of Oxygen 0 L     3 Minute Oxygen Saturation % 93 %     3 Minute Liters of Oxygen 0 L     4 Minute Oxygen Saturation % 94 %     4 Minute Liters of Oxygen 0 L     5 Minute Oxygen Saturation % 95 %     5 Minute Liters of Oxygen 0 L     6 Minute Oxygen Saturation % 95 %     6 Minute Liters of Oxygen 0 L     2 Minute Post Oxygen Saturation % 96 %     2 Minute Post Liters of Oxygen 0 L

## 2023-01-14 NOTE — Progress Notes (Signed)
Pulmonary Individual Treatment Plan  Patient Details  Name: Steve Wang MRN: [id number] Date of Birth: February 18, 1971 Referring Provider:    Initial Encounter Date:  Flowsheet Row Pulmonary Rehab from 11/08/2022 in [facility]  Date 11/08/22       Visit Diagnosis: Stage 2 moderate COPD by GOLD classification (HCC)  Patient's Home Medications on Admission:  Current Outpatient Medications:    Adalimumab 40 MG/0.4ML PNKT, Inject 40 mg into the skin every 14 (fourteen) days., Disp: 6 each, Rfl: 0   benzonatate (TESSALON) 200 MG capsule, Take 1 capsule (200 mg total) by mouth 3 (three) times daily as needed for cough. (Patient not taking: Reported on 10/30/2022), Disp: 30 capsule, Rfl: 0   Budeson-Glycopyrrol-Formoterol (BREZTRI AEROSPHERE) 160-9-4.8 MCG/ACT AERO, Inhale 2 puffs into the lungs in the morning and at bedtime. (Patient not taking: Reported on 10/30/2022), Disp: 5.9 g, Rfl: 0   co-enzyme Q-10 30 MG capsule, Take 30 mg by mouth daily. (Patient not taking: Reported on 10/31/2022), Disp: , Rfl:    empagliflozin (JARDIANCE) 10 MG TABS tablet, Take 1 tablet (10 mg total) by mouth daily before breakfast., Disp: 90 tablet, Rfl: 3   levalbuterol (XOPENEX HFA) 45 MCG/ACT inhaler, Inhale 2 puffs into the lungs every 6 (six) hours as needed for wheezing., Disp: 1 each, Rfl: 12   metoprolol succinate (TOPROL XL) 25 MG 24 hr tablet, Take 0.5 tablets (12.5 mg total) by mouth daily., Disp: 45 tablet, Rfl: 3   naproxen sodium (ANAPROX DS) 550 MG tablet, Take 1 tablet (550 mg total) by mouth 2 (two) times daily with a meal. (Patient not taking: Reported on 06/11/2022), Disp: 60 tablet, Rfl: 1   predniSONE (DELTASONE) 10 MG tablet, Take 1 tablet (10 mg total) by mouth daily with breakfast., Disp: 90 tablet, Rfl: 0   sacubitril-valsartan (ENTRESTO) 24-26 MG, Take 0.5 tablets by mouth 2 (two) times daily. (Patient not taking: Reported on 06/11/2022), Disp: 90 tablet, Rfl: 3    spironolactone (ALDACTONE) 25 MG tablet, Take 0.5 tablets by mouth once., Disp: , Rfl:    Tiotropium Bromide-Olodaterol (STIOLTO RESPIMAT) 2.5-2.5 MCG/ACT AERS, Inhale 2 puffs into the lungs daily., Disp: 4 g, Rfl: 11 No current facility-administered medications for this visit.  Facility-Administered Medications Ordered in Other Visits:    albuterol (PROVENTIL) (2.5 MG/3ML) 0.083% nebulizer solution 2.5 mg, 2.5 mg, Nebulization, Once, Steve Saner, MD  Past Medical History: Past Medical History:  Diagnosis Date   COPD (chronic obstructive pulmonary disease) (HCC)    Emphysema of lung (HCC)    GERD (gastroesophageal reflux disease)    HFrEF (heart failure with reduced ejection fraction) (HCC)    a. 09/2018 Echo: EF 55-60%; b. 08/2020 Echo: EF 45-50%, grade 2 diastolic dysfunction; c. 10/2020 cMRI: EF 36%, mild to mod dil LV. Mod red RV fxn. No LGE/evidence of sarcoid. No signif valvular dzs   NICM (nonischemic cardiomyopathy) (HCC)    a. 09/2018 Echo: EF 55-60%; b. 08/2020 Echo: EF 45-50%; c. 10/2020 cMRI: EF 36%, mild to mod dil LV. Mod red RV fxn. No LGE/evidence of sarcoid. No signif valvular dzs; d. 12/2020 Cor CTA: Ca2+= 0. Nl cors.   Pulmonary nodule    a. 12/2020 CT chest: 1.3cm posterior basal LUL nodule and 3mm LLL nodule - unchanged.   RBBB    Sarcoidosis    a. Dx 10/2020 in [place] - prev on steroids/methotrexate.    Tobacco Use: Social History   Tobacco Use  Smoking Status Former   Packs/day:  1.00   Years: 25.00   Additional pack years: 0.00   Total pack years: 25.00   Types: Cigarettes   Quit date: 07/23/2008   Years since quitting: 14.4   Passive exposure: Never  Smokeless Tobacco Former   Types: Chew   Quit date: 07/24/2020    Labs: Review Flowsheet       Latest Ref Rng & Units 06/25/2018  Labs for [facility]P Cardiac and Pulmonary Rehab  Cholestrol 100 - 199 mg/dL 161   LDL (calc) 0 - 99 mg/dL 84   HDL-C >09 mg/dL 57   Trlycerides 0 - 604 mg/dL 70      Pulmonary  Assessment Scores:  Pulmonary Assessment Scores     Row Name 11/08/22 1201 12/27/22 1747       ADL UCSD   ADL Phase Entry Exit    SOB Score total 11 14    Rest 0 0    Walk 0 0    Stairs 3 3    Bath 0 0    Dress 0 0    Shop 0 0      CAT Score   CAT Score 13 16      mMRC Score   mMRC Score 1 --             UCSD: Self-administered rating of dyspnea associated with activities of daily living (ADLs) 6-point scale (0 = "not at all" to 5 = "maximal or unable to do because of breathlessness")  Scoring Scores range from 0 to 120.  Minimally important difference is 5 units  CAT: CAT can identify the health impairment of COPD patients and is better correlated with disease progression.  CAT has a scoring range of zero to 40. The CAT score is classified into four groups of low (less than 10), medium (10 - 20), high (21-30) and very high (31-40) based on the impact level of disease on health status. A CAT score over 10 suggests significant symptoms.  A worsening CAT score could be explained by an exacerbation, poor medication adherence, poor inhaler technique, or progression of COPD or comorbid conditions.  CAT MCID is 2 points  mMRC: mMRC (Modified Medical Research Council) Dyspnea Scale is used to assess the degree of baseline functional disability in patients of respiratory disease due to dyspnea. No minimal important difference is established. A decrease in score of 1 point or greater is considered a positive change.   Pulmonary Function Assessment:  Pulmonary Function Assessment - 11/01/22 1010       Pulmonary Function Tests   FEV1% 63 %    FEV1/FVC Ratio 58      Breath   Shortness of Breath Yes;Limiting activity             Exercise Target Goals: Exercise Program Goal: Individual exercise prescription set using results from initial 6 min walk test and THRR while considering  patient's activity barriers and safety.   Exercise Prescription Goal: Initial exercise  prescription builds to 30-45 minutes a day of aerobic activity, 2-3 days per week.  Home exercise guidelines will be given to patient during program as part of exercise prescription that the participant will acknowledge.  Education: Aerobic Exercise: - Group verbal and visual presentation on the components of exercise prescription. Introduces F.I.T.T principle from ACSM for exercise prescriptions.  Reviews F.I.T.T. principles of aerobic exercise including progression. Written material given at graduation.   Education: Resistance Exercise: - Group verbal and visual presentation on the components of exercise prescription. Introduces  F.I.T.T principle from ACSM for exercise prescriptions  Reviews F.I.T.T. principles of resistance exercise including progression. Written material given at graduation.    Education: Exercise & Equipment Safety: - Individual verbal instruction and demonstration of equipment use and safety with use of the equipment. Flowsheet Row Pulmonary Rehab from 11/01/2022 in [facility] Card[facility] Pulmonary Rehab  Date 11/01/22  Educator jh  Instruction Review Code 1- Verbalizes Understanding       Education: Exercise Physiology & General Exercise Guidelines: - Group verbal and written instruction with models to review the exercise physiology of the cardiovascular system and associated critical values. Provides general exercise guidelines with specific guidelines to those with heart or lung disease.    Education: Flexibility, Balance, Mind/Body Relaxation: - Group verbal and visual presentation with interactive activity on the components of exercise prescription. Introduces F.I.T.T principle from ACSM for exercise prescriptions. Reviews F.I.T.T. principles of flexibility and balance exercise training including progression. Also discusses the mind body connection.  Reviews various relaxation techniques to help reduce and manage stress (i.e. Deep breathing, progressive muscle  relaxation, and visualization). Balance handout provided to take home. Written material given at graduation.   Activity Barriers & Risk Stratification:  Activity Barriers & Cardiac Risk Stratification - 11/08/22 1303       Activity Barriers & Cardiac Risk Stratification   Activity Barriers Shortness of Breath;Other (comment)    Comments Overall joint and muscle pain             6 Minute Walk:  6 Minute Walk     Row Name 11/08/22 1253         6 Minute Walk   Phase Initial     Distance 1505 feet     Walk Time 6 minutes     # of Rest Breaks 0     MPH 2.85     METS 4.19     RPE 7     Perceived Dyspnea  0     VO2 Peak 14.67     Symptoms No     Resting HR 62 bpm     Resting BP 110/68     Resting Oxygen Saturation  96 %     Exercise Oxygen Saturation  during 6 min walk 93 %     Max Ex. HR 90 bpm     Max Ex. BP 126/70     2 Minute Post BP 114/68       Interval HR   1 Minute HR 74     2 Minute HR 80     3 Minute HR 85     4 Minute HR 87     5 Minute HR 86     6 Minute HR 90     2 Minute Post HR 61     Interval Heart Rate? Yes       Interval Oxygen   Interval Oxygen? Yes     Baseline Oxygen Saturation % 96 %     1 Minute Oxygen Saturation % 95 %     1 Minute Liters of Oxygen 0 L  RA     2 Minute Oxygen Saturation % 94 %     2 Minute Liters of Oxygen 0 L     3 Minute Oxygen Saturation % 93 %     3 Minute Liters of Oxygen 0 L     4 Minute Oxygen Saturation % 94 %     4 Minute Liters of Oxygen 0 L  5 Minute Oxygen Saturation % 95 %     5 Minute Liters of Oxygen 0 L     6 Minute Oxygen Saturation % 95 %     6 Minute Liters of Oxygen 0 L     2 Minute Post Oxygen Saturation % 96 %     2 Minute Post Liters of Oxygen 0 L             Oxygen Initial Assessment:  Oxygen Initial Assessment - 11/08/22 1200       Home Oxygen   Home Oxygen Device None    Sleep Oxygen Prescription None    Home Exercise Oxygen Prescription None    Home Resting Oxygen  Prescription None      Initial 6 min Walk   Oxygen Used None      Program Oxygen Prescription   Program Oxygen Prescription None      Intervention   Short Term Goals To learn and demonstrate proper use of respiratory medications;To learn and understand importance of maintaining oxygen saturations>88%;To learn and demonstrate proper pursed lip breathing techniques or other breathing techniques. ;To learn and understand importance of monitoring SPO2 with pulse oximeter and demonstrate accurate use of the pulse oximeter.;To learn and exhibit compliance with exercise, home and travel O2 prescription    Long  Term Goals Exhibits compliance with exercise, home  and travel O2 prescription;Maintenance of O2 saturations>88%;Compliance with respiratory medication;Verbalizes importance of monitoring SPO2 with pulse oximeter and return demonstration;Exhibits proper breathing techniques, such as pursed lip breathing or other method taught during program session;Demonstrates proper use of MDI's             Oxygen Re-Evaluation:  Oxygen Re-Evaluation     Row Name 11/14/22 1741 11/28/22 1729           Program Oxygen Prescription   Program Oxygen Prescription -- None        Home Oxygen   Home Oxygen Device -- None      Sleep Oxygen Prescription -- None      Home Exercise Oxygen Prescription -- None      Home Resting Oxygen Prescription -- None        Goals/Expected Outcomes   Short Term Goals -- To learn and understand importance of maintaining oxygen saturations>88%;To learn and understand importance of monitoring SPO2 with pulse oximeter and demonstrate accurate use of the pulse oximeter.      Long  Term Goals -- Maintenance of O2 saturations>88%;Verbalizes importance of monitoring SPO2 with pulse oximeter and return demonstration      Comments Reviewed PLB technique with pt.  Talked about how it works and it's importance in maintaining their exercise saturations. --      Goals/Expected  Outcomes Short: Become more profiecient at using PLB.   Long: Become independent at using PLB. --               Oxygen Discharge (Final Oxygen Re-Evaluation):  Oxygen Re-Evaluation - 11/28/22 1729       Program Oxygen Prescription   Program Oxygen Prescription None      Home Oxygen   Home Oxygen Device None    Sleep Oxygen Prescription None    Home Exercise Oxygen Prescription None    Home Resting Oxygen Prescription None      Goals/Expected Outcomes   Short Term Goals To learn and understand importance of maintaining oxygen saturations>88%;To learn and understand importance of monitoring SPO2 with pulse oximeter and demonstrate accurate use of the pulse oximeter.  Long  Term Goals Maintenance of O2 saturations>88%;Verbalizes importance of monitoring SPO2 with pulse oximeter and return demonstration             Initial Exercise Prescription:  Initial Exercise Prescription - 11/08/22 1200       Date of Initial Exercise RX and Referring Provider   Date 11/08/22      Oxygen   Maintain Oxygen Saturation 88% or higher      Treadmill   MPH 2.9    Grade 1    Minutes 15    METs 3.62      Recumbant Bike   Level 3    Watts 35    Minutes 15    METs 4      Recumbant Elliptical   Level 2.5    Minutes 15    METs 4      REL-XR   Level 3    Minutes 15    METs 4      Prescription Details   Frequency (times per week) 2    Duration Progress to 30 minutes of continuous aerobic without signs/symptoms of physical distress      Intensity   THRR 40-80% of Max Heartrate 104 - 147    Ratings of Perceived Exertion 11-13    Perceived Dyspnea 0-4      Progression   Progression Continue to progress workloads to maintain intensity without signs/symptoms of physical distress.      Resistance Training   Training Prescription Yes    Weight 5 lb    Reps 10-15             Perform Capillary Blood Glucose checks as needed.  Exercise Prescription Changes:    Exercise Prescription Changes     Row Name 11/08/22 1200 11/19/22 1400 11/21/22 1800 12/03/22 1300 12/18/22 0700     Response to Exercise   Blood Pressure (Admit) 110/68 108/72 -- 104/60 98/58   Blood Pressure (Exercise) 126/70 132/60 -- 112/64 132/72   Blood Pressure (Exit) 112/70 104/70 -- 106/68 102/64   Heart Rate (Admit) 62 bpm 74 bpm -- 80 bpm 71 bpm   Heart Rate (Exercise) 90 bpm 105 bpm -- 107 bpm 108 bpm   Heart Rate (Exit) 61 bpm 75 bpm -- 87 bpm 88 bpm   Oxygen Saturation (Admit) 96 % 95 % -- 96 % 96 %   Oxygen Saturation (Exercise) 93 % 94 % -- 94 % 92 %   Oxygen Saturation (Exit) 96 % 95 % -- 97 % 95 %   Rating of Perceived Exertion (Exercise) 7 11 -- 13 13   Perceived Dyspnea (Exercise) 0 1 -- 2 1   Symptoms none none -- SOB SOB   Comments walk test results First two days of exercise -- -- --   Duration -- Continue with 30 min of aerobic exercise without signs/symptoms of physical distress. -- Continue with 30 min of aerobic exercise without signs/symptoms of physical distress. Continue with 30 min of aerobic exercise without signs/symptoms of physical distress.   Intensity -- THRR unchanged -- THRR unchanged THRR unchanged     Progression   Progression -- Continue to progress workloads to maintain intensity without signs/symptoms of physical distress. -- Continue to progress workloads to maintain intensity without signs/symptoms of physical distress. Continue to progress workloads to maintain intensity without signs/symptoms of physical distress.   Average METs -- 3.94 -- 4.14 4.72     Resistance Training   Training Prescription -- Yes -- Yes Yes  Weight -- 5 lb -- 7 lb 7 lb   Reps -- 10-15 -- 10-15 10-15     Interval Training   Interval Training -- No -- No No     Treadmill   MPH -- 2.8 -- 3.1 3.1   Grade -- 2 -- 2.5 4   Minutes -- 15 -- 15 15   METs -- 3.91 -- 4.44 5.08     Recumbant Bike   Level -- 5 -- 6 7   Watts -- 33 -- 65 51   Minutes -- 15 -- 15 15    METs -- 3.44 -- 4.73 4.07     NuStep   Level -- -- -- 1 --   Minutes -- -- -- 15 --   METs -- -- -- 4.44 --     REL-XR   Level -- 5 -- 4 8   Minutes -- 15 -- 15 15   METs -- 4.8 -- 4.5 5.6     Home Exercise Plan   Plans to continue exercise at -- -- Home (comment)  walking, treadmill, stationary bike, weights Home (comment)  walking, treadmill, stationary bike, weights Home (comment)  walking, treadmill, stationary bike, weights   Frequency -- -- Add 2 additional days to program exercise sessions. Add 2 additional days to program exercise sessions. Add 2 additional days to program exercise sessions.   Initial Home Exercises Provided -- -- 11/21/22 11/21/22 11/21/22     Oxygen   Maintain Oxygen Saturation -- 88% or higher 88% or higher 88% or higher 88% or higher    Row Name 12/31/22 1500             Response to Exercise   Blood Pressure (Admit) 98/60       Blood Pressure (Exit) 102/62       Heart Rate (Admit) 90 bpm       Heart Rate (Exercise) 102 bpm       Heart Rate (Exit) 94 bpm       Oxygen Saturation (Admit) 95 %       Oxygen Saturation (Exercise) 95 %       Oxygen Saturation (Exit) 95 %       Rating of Perceived Exertion (Exercise) 12       Perceived Dyspnea (Exercise) 2       Symptoms SOB       Duration Continue with 30 min of aerobic exercise without signs/symptoms of physical distress.       Intensity THRR unchanged         Progression   Progression Continue to progress workloads to maintain intensity without signs/symptoms of physical distress.       Average METs 4.88         Resistance Training   Training Prescription Yes       Weight 7 lb       Reps 10-15         Interval Training   Interval Training No         Treadmill   MPH 3.2       Grade 5       Minutes 15       METs 5.66         Recumbant Bike   Level 7       Watts 51       Minutes 15       METs 4.1         Home Exercise Plan   Plans  to continue exercise at Home (comment)   walking, treadmill, stationary bike, weights       Frequency Add 2 additional days to program exercise sessions.       Initial Home Exercises Provided 11/21/22         Oxygen   Maintain Oxygen Saturation 88% or higher                Exercise Comments:   Exercise Comments     Row Name 11/14/22 1739           Exercise Comments First full day of exercise!  Patient was oriented to gym and equipment including functions, settings, policies, and procedures.  Patient's individual exercise prescription and treatment plan were reviewed.  All starting workloads were established based on the results of the 6 minute walk test done at initial orientation visit.  The plan for exercise progression was also introduced and progression will be customized based on patient's performance and goals.                Exercise Goals and Review:   Exercise Goals     Row Name 11/08/22 1301             Exercise Goals   Increase Physical Activity Yes       Intervention Develop an individualized exercise prescription for aerobic and resistive training based on initial evaluation findings, risk stratification, comorbidities and participant's personal goals.;Provide advice, education, support and counseling about physical activity/exercise needs.       Expected Outcomes Short Term: Attend rehab on a regular basis to increase amount of physical activity.;Long Term: Add in home exercise to make exercise part of routine and to increase amount of physical activity.;Long Term: Exercising regularly at least 3-5 days a week.       Increase Strength and Stamina Yes       Intervention Provide advice, education, support and counseling about physical activity/exercise needs.;Develop an individualized exercise prescription for aerobic and resistive training based on initial evaluation findings, risk stratification, comorbidities and participant's personal goals.       Expected Outcomes Short Term: Increase workloads  from initial exercise prescription for resistance, speed, and METs.;Short Term: Perform resistance training exercises routinely during rehab and add in resistance training at home;Long Term: Improve cardiorespiratory fitness, muscular endurance and strength as measured by increased METs and functional capacity ( )       Able to understand and use rate of perceived exertion (RPE) scale Yes       Intervention Provide education and explanation on how to use RPE scale       Expected Outcomes Short Term: Able to use RPE daily in rehab to express subjective intensity level;Long Term:  Able to use RPE to guide intensity level when exercising independently       Able to understand and use Dyspnea scale Yes       Intervention Provide education and explanation on how to use Dyspnea scale       Expected Outcomes Short Term: Able to use Dyspnea scale daily in rehab to express subjective sense of shortness of breath during exertion;Long Term: Able to use Dyspnea scale to guide intensity level when exercising independently       Knowledge and understanding of Target Heart Rate Range (THRR) Yes       Intervention Provide education and explanation of THRR including how the numbers were predicted and where they are located for reference       Expected Outcomes  Short Term: Able to state/look up THRR;Long Term: Able to use THRR to govern intensity when exercising independently;Short Term: Able to use daily as guideline for intensity in rehab       Able to check pulse independently Yes       Intervention Provide education and demonstration on how to check pulse in carotid and radial arteries.;Review the importance of being able to check your own pulse for safety during independent exercise       Expected Outcomes Long Term: Able to check pulse independently and accurately;Short Term: Able to explain why pulse checking is important during independent exercise       Understanding of Exercise Prescription Yes        Intervention Provide education, explanation, and written materials on patient's individual exercise prescription       Expected Outcomes Short Term: Able to explain program exercise prescription;Long Term: Able to explain home exercise prescription to exercise independently                Exercise Goals Re-Evaluation :  Exercise Goals Re-Evaluation     Row Name 11/14/22 1740 11/19/22 1501 11/21/22 1806 12/03/22 1317 12/18/22 0751     Exercise Goal Re-Evaluation   Exercise Goals Review Increase Physical Activity;Able to understand and use rate of perceived exertion (RPE) scale;Knowledge and understanding of Target Heart Rate Range (THRR);Understanding of Exercise Prescription;Increase Strength and Stamina;Able to check pulse independently;Able to understand and use Dyspnea scale Increase Physical Activity;Increase Strength and Stamina;Understanding of Exercise Prescription Increase Physical Activity;Increase Strength and Stamina;Understanding of Exercise Prescription;Able to understand and use Dyspnea scale;Able to check pulse independently;Able to understand and use rate of perceived exertion (RPE) scale;Knowledge and understanding of Target Heart Rate Range (THRR) Increase Strength and Stamina;Increase Physical Activity;Understanding of Exercise Prescription Increase Strength and Stamina;Increase Physical Activity;Understanding of Exercise Prescription   Comments Reviewed RPE scale, THR and program prescription with pt today.  Pt voiced understanding and was given a copy of goals to take home. Steve Wang is off to a good start in the program. He was able to work up to a speed of 2.8 mph with a 2% incline on the treadmill during his first two sessions. He also worked at level 5 on both the XR and recumbent bike. He did well with 5 lb hand weights for resistance training as well. We will continue to monitor his progress in the program. Reviewed home exercise with pt today.  Pt plans to walk outside and on  the treadmill for exercise.  He also has weights at home and a stationary bike to use. HE had been using them but hasn't for the last week. Encouraged to add on 1 day of structured exercise at home. Explain pysical activity versus being physically active. Reviewed THR, pulse, RPE, sign and symptoms, pulse oximetery and when to call 911 or MD.  Also discussed weather considerations and indoor options.  Pt voiced understanding. Steve Wang continues to do well in rehab. He was able to increase to level 6 on the recumbent bike working over 65 watts! He also increased his workload on the treadmill to 3.1/2.5%- all working with appropriate RPEs. Steve Wang is now using 7 lb for handweights . He has been reaching his THR some sessions so far. Will continue to monitor. Steve Wang continues to do well in rehab. He recently increased his overall average MET level to 4.72 METs. He also improved to level 8 on the XR. He increased his workload on the treadmill as well, to an incline  of 4% while maintaining his speed at 3.1 mph. We will continue to monitor his progress in the program.   Expected Outcomes Short: Use RPE daily to regulate intensity.  Long: Follow program prescription in THR. Short: Continue to progressively increase workloads. Long: Continue to improve strength and stamina. Short: Add on 1 day of structred exercise Long: Continue to exercise indepedently at home at appropriate prescription Short: Continue working up watts on recumbent bike Long: Continue to increase overall MET level and stmaina Short: Continue to progressively increase treadmill workload. Long: Continue to improve strength and stamina.    Row Name 12/31/22 1532 01/14/23 1459           Exercise Goal Re-Evaluation   Exercise Goals Review Increase Strength and Stamina;Increase Physical Activity;Understanding of Exercise Prescription Increase Strength and Stamina;Increase Physical Activity;Understanding of Exercise Prescription      Comments Steve Wang is  set to graduate this week per his request.  We expect to see an improvement in his post .  We will conitinue to encourage him to exercise on his own. Steve Wang is set to graduate per his request.  We expect to see an improvement on his post .  We will conitinue to encourage him to exercise on his own.      Expected Outcomes Short: Graduate and improve post Long: Continue to exercise indpendently --               Discharge Exercise Prescription (Final Exercise Prescription Changes):  Exercise Prescription Changes - 12/31/22 1500       Response to Exercise   Blood Pressure (Admit) 98/60    Blood Pressure (Exit) 102/62    Heart Rate (Admit) 90 bpm    Heart Rate (Exercise) 102 bpm    Heart Rate (Exit) 94 bpm    Oxygen Saturation (Admit) 95 %    Oxygen Saturation (Exercise) 95 %    Oxygen Saturation (Exit) 95 %    Rating of Perceived Exertion (Exercise) 12    Perceived Dyspnea (Exercise) 2    Symptoms SOB    Duration Continue with 30 min of aerobic exercise without signs/symptoms of physical distress.    Intensity THRR unchanged      Progression   Progression Continue to progress workloads to maintain intensity without signs/symptoms of physical distress.    Average METs 4.88      Resistance Training   Training Prescription Yes    Weight 7 lb    Reps 10-15      Interval Training   Interval Training No      Treadmill   MPH 3.2    Grade 5    Minutes 15    METs 5.66      Recumbant Bike   Level 7    Watts 51    Minutes 15    METs 4.1      Home Exercise Plan   Plans to continue exercise at Home (comment)   walking, treadmill, stationary bike, weights   Frequency Add 2 additional days to program exercise sessions.    Initial Home Exercises Provided 11/21/22      Oxygen   Maintain Oxygen Saturation 88% or higher             Nutrition:  Target Goals: Understanding of nutrition guidelines, daily intake of sodium 1500mg , cholesterol 200mg , calories 30%  from fat and 7% or less from saturated fats, daily to have 5 or more servings of fruits and vegetables.  Education: All About Nutrition: -  Group instruction provided by verbal, written material, interactive activities, discussions, models, and posters to present general guidelines for heart healthy nutrition including fat, fiber, MyPlate, the role of sodium in heart healthy nutrition, utilization of the nutrition label, and utilization of this knowledge for meal planning. Follow up email sent as well. Written material given at graduation.   Biometrics:  Pre Biometrics - 11/08/22 1303       Pre Biometrics   Height 5\' 7"  (1.702 m)    Weight 171 lb 14.4 oz (78 kg)    Waist Circumference --   declined   Hip Circumference --   declined   BMI (Calculated) 26.92    Single Leg Stand 30 seconds              Nutrition Therapy Plan and Nutrition Goals:  Nutrition Therapy & Goals - 11/08/22 1158       Nutrition Therapy   RD appointment deferred Yes   Deferred     Intervention Plan   Intervention Prescribe, educate and counsel regarding individualized specific dietary modifications aiming towards targeted core components such as weight, hypertension, lipid management, diabetes, heart failure and other comorbidities.    Expected Outcomes Short Term Goal: Understand basic principles of dietary content, such as calories, fat, sodium, cholesterol and nutrients.;Short Term Goal: A plan has been developed with personal nutrition goals set during dietitian appointment.;Long Term Goal: Adherence to prescribed nutrition plan.             Nutrition Assessments:  MEDIFICTS Score Key: ?70 Need to make dietary changes  40-70 Heart Healthy Diet ? 40 Therapeutic Level Cholesterol Diet  Flowsheet Row Pulmonary Rehab from 12/26/2022 in [facility] Cardiac and Pulmonary Rehab  Picture Your Plate Total Score on Admission 53      Picture Your Plate Scores: <16 Unhealthy dietary pattern with much room for  improvement. 41-50 Dietary pattern unlikely to meet recommendations for good health and room for improvement. 51-60 More healthful dietary pattern, with some room for improvement.  >60 Healthy dietary pattern, although there may be some specific behaviors that could be improved.   Nutrition Goals Re-Evaluation:  Nutrition Goals Re-Evaluation     Row Name 11/28/22 1730             Goals   Current Weight 169 lb (76.7 kg)       Nutrition Goal eat smaller portions.       Comment Patient was informed on why it is important to maintain a balanced diet when dealing with Respiratory issues. Explained that it takes a lot of energy to breath and when they are short of breath often they will need to have a good diet to help keep up with the calories they are expending for breathing.       Expected Outcome Short: Choose and plan snacks accordingly to patients caloric intake to improve breathing. Long: Maintain a diet independently that meets their caloric intake to aid in daily shortness of breath.                Nutrition Goals Discharge (Final Nutrition Goals Re-Evaluation):  Nutrition Goals Re-Evaluation - 11/28/22 1730       Goals   Current Weight 169 lb (76.7 kg)    Nutrition Goal eat smaller portions.    Comment Patient was informed on why it is important to maintain a balanced diet when dealing with Respiratory issues. Explained that it takes a lot of energy to breath and when they are short of  breath often they will need to have a good diet to help keep up with the calories they are expending for breathing.    Expected Outcome Short: Choose and plan snacks accordingly to patients caloric intake to improve breathing. Long: Maintain a diet independently that meets their caloric intake to aid in daily shortness of breath.             Psychosocial: Target Goals: Acknowledge presence or absence of significant depression and/or stress, maximize coping skills, provide positive support  system. Participant is able to verbalize types and ability to use techniques and skills needed for reducing stress and depression.   Education: Stress, Anxiety, and Depression - Group verbal and visual presentation to define topics covered.  Reviews how body is impacted by stress, anxiety, and depression.  Also discusses healthy ways to reduce stress and to treat/manage anxiety and depression.  Written material given at graduation.   Education: Sleep Hygiene -Provides group verbal and written instruction about how sleep can affect your health.  Define sleep hygiene, discuss sleep cycles and impact of sleep habits. Review good sleep hygiene tips.    Initial Review & Psychosocial Screening:  Initial Psych Review & Screening - 11/01/22 1011       Initial Review   Current issues with Current Depression;History of Depression;Current Stress Concerns;Current Sleep Concerns    Source of Stress Concerns Chronic Illness    Comments His health issues get him down sometimes but does not want or need to take medication for it.  He sometimes is worried to go to sleep with fear that he will not wake up with saroidosis of the heart.      Family Dynamics   Good Support System? Yes    Comments He can look to his wife for support.             Quality of Life Scores:  Scores of 19 and below usually indicate a poorer quality of life in these areas.  A difference of  2-3 points is a clinically meaningful difference.  A difference of 2-3 points in the total score of the Quality of Life Index has been associated with significant improvement in overall quality of life, self-image, physical symptoms, and general health in studies assessing change in quality of life.  PHQ-9: Review Flowsheet       12/27/2022 11/08/2022 08/05/2018 06/25/2018  Depression screen PHQ 2/9  Decreased Interest 0 0 0 0  Down, Depressed, Hopeless 1 0 - 0  PHQ - 2 Score 1 0 0 0  Altered sleeping 0 1 1 1   Tired, decreased energy 1 1  1 1   Change in appetite 0 0 0 0  Feeling bad or failure about yourself  1 0 0 0  Trouble concentrating 0 0 0 0  Moving slowly or fidgety/restless 0 0 0 0  Suicidal thoughts 0 0 0 0  PHQ-9 Score 3 2 2 2   Difficult doing work/chores Somewhat difficult Not difficult at all Not difficult at all Not difficult at all   Interpretation of Total Score  Total Score Depression Severity:  1-4 = Minimal depression, 5-9 = Mild depression, 10-14 = Moderate depression, 15-19 = Moderately severe depression, 20-27 = Severe depression   Psychosocial Evaluation and Intervention:  Psychosocial Evaluation - 11/01/22 1013       Psychosocial Evaluation & Interventions   Interventions Encouraged to exercise with the program and follow exercise prescription;Relaxation education;Stress management education    Comments His health issues get him down  sometimes but does not want or need to take medication for it.He can look to his wife for support.He sometimes is worried to go to sleep with fear that he will not wake up with saroidosis of the heart.    Expected Outcomes Short: Start [facility] to help with mood. Long: Maintain a healthy mental state    Continue Psychosocial Services  Follow up required by staff             Psychosocial Re-Evaluation:  Psychosocial Re-Evaluation     Row Name 11/28/22 1733             Psychosocial Re-Evaluation   Current issues with None Identified       Comments Patient reports no issues with their current mental states, sleep, stress, depression or anxiety. Will follow up with patient in a few weeks for any changes.       Expected Outcomes Short: Continue to exercise regularly to support mental health and notify staff of any changes. Long: maintain mental health and well being through teaching of rehab or prescribed medications independently.       Interventions Encouraged to attend Pulmonary Rehabilitation for the exercise       Continue Psychosocial Services  Follow up  required by staff                Psychosocial Discharge (Final Psychosocial Re-Evaluation):  Psychosocial Re-Evaluation - 11/28/22 1733       Psychosocial Re-Evaluation   Current issues with None Identified    Comments Patient reports no issues with their current mental states, sleep, stress, depression or anxiety. Will follow up with patient in a few weeks for any changes.    Expected Outcomes Short: Continue to exercise regularly to support mental health and notify staff of any changes. Long: maintain mental health and well being through teaching of rehab or prescribed medications independently.    Interventions Encouraged to attend Pulmonary Rehabilitation for the exercise    Continue Psychosocial Services  Follow up required by staff             Education: Education Goals: Education classes will be provided on a weekly basis, covering required topics. Participant will state understanding/return demonstration of topics presented.  Learning Barriers/Preferences:  Learning Barriers/Preferences - 11/01/22 1010       Learning Barriers/Preferences   Learning Barriers None    Learning Preferences None             General Pulmonary Education Topics:  Infection Prevention: - Provides verbal and written material to individual with discussion of infection control including proper hand washing and proper equipment cleaning during exercise session. Flowsheet Row Pulmonary Rehab from 11/01/2022 in [facility] Cardiac and Pulmonary Rehab  Date 11/01/22  Educator jh  Instruction Review Code 1- Verbalizes Understanding       Falls Prevention: - Provides verbal and written material to individual with discussion of falls prevention and safety. Flowsheet Row Pulmonary Rehab from 11/01/2022 in [facility] Cardiac and Pulmonary Rehab  Date 11/01/22  Educator jh  Instruction Review Code 1- Verbalizes Understanding       Chronic Lung Disease Review: - Group verbal instruction with  posters, models, PowerPoint presentations and videos,  to review new updates, new respiratory medications, new advancements in procedures and treatments. Providing information on websites and "800" numbers for continued self-education. Includes information about supplement oxygen, available portable oxygen systems, continuous and intermittent flow rates, oxygen safety, concentrators, and Medicare reimbursement for oxygen. Explanation of Pulmonary Drugs, including class,  frequency, complications, importance of spacers, rinsing mouth after steroid MDI's, and proper cleaning methods for nebulizers. Review of basic lung anatomy and physiology related to function, structure, and complications of lung disease. Review of risk factors. Discussion about methods for diagnosing sleep apnea and types of masks and machines for OSA. Includes a review of the use of types of environmental controls: home humidity, furnaces, filters, dust mite/pet prevention, HEPA vacuums. Discussion about weather changes, air quality and the benefits of nasal washing. Instruction on Warning signs, infection symptoms, calling MD promptly, preventive modes, and value of vaccinations. Review of effective airway clearance, coughing and/or vibration techniques. Emphasizing that all should Create an Action Plan. Written material given at graduation. Flowsheet Row Pulmonary Rehab from 11/08/2022 in [facility] Cardiac and Pulmonary Rehab  Education need identified 11/08/22       AED/CPR: - Group verbal and written instruction with the use of models to demonstrate the basic use of the AED with the basic ABC's of resuscitation.    Anatomy and Cardiac Procedures: - Group verbal and visual presentation and models provide information about basic cardiac anatomy and function. Reviews the testing methods done to diagnose heart disease and the outcomes of the test results. Describes the treatment choices: Medical Management, Angioplasty, or Coronary Bypass  Surgery for treating various heart conditions including Myocardial Infarction, Angina, Valve Disease, and Cardiac Arrhythmias.  Written material given at graduation.   Medication Safety: - Group verbal and visual instruction to review commonly prescribed medications for heart and lung disease. Reviews the medication, class of the drug, and side effects. Includes the steps to properly store meds and maintain the prescription regimen.  Written material given at graduation.   Other: -Provides group and verbal instruction on various topics (see comments)   Knowledge Questionnaire Score:  Knowledge Questionnaire Score - 12/27/22 1747       Knowledge Questionnaire Score   Post Score 17/18              Core Components/Risk Factors/Patient Goals at Admission:  Personal Goals and Risk Factors at Admission - 11/08/22 1302       Core Components/Risk Factors/Patient Goals on Admission    Weight Management Yes;Weight Maintenance    Intervention Weight Management: Develop a combined nutrition and exercise program designed to reach desired caloric intake, while maintaining appropriate intake of nutrient and fiber, sodium and fats, and appropriate energy expenditure required for the weight goal.;Weight Management/Obesity: Establish reasonable short term and long term weight goals.;Weight Management: Provide education and appropriate resources to help participant work on and attain dietary goals.    Admit Weight 171 lb (77.6 kg)    Goal Weight: Short Term 171 lb (77.6 kg)    Goal Weight: Long Term 171 lb (77.6 kg)    Expected Outcomes Short Term: Continue to assess and modify interventions until short term weight is achieved;Long Term: Adherence to nutrition and physical activity/exercise program aimed toward attainment of established weight goal;Weight Maintenance: Understanding of the daily nutrition guidelines, which includes 25-35% calories from fat, 7% or less cal from saturated fats, less than  200mg  cholesterol, less than 1.5gm of sodium, & 5 or more servings of fruits and vegetables daily;Understanding recommendations for meals to include 15-35% energy as protein, 25-35% energy from fat, 35-60% energy from carbohydrates, less than 200mg  of dietary cholesterol, 20-35 gm of total fiber daily;Understanding of distribution of calorie intake throughout the day with the consumption of 4-5 meals/snacks    Improve shortness of breath with ADL's Yes  Intervention Provide education, individualized exercise plan and daily activity instruction to help decrease symptoms of SOB with activities of daily living.    Expected Outcomes Short Term: Improve cardiorespiratory fitness to achieve a reduction of symptoms when performing ADLs;Long Term: Be able to perform more ADLs without symptoms or delay the onset of symptoms    Hypertension Yes    Intervention Provide education on lifestyle modifcations including regular physical activity/exercise, weight management, moderate sodium restriction and increased consumption of fresh fruit, vegetables, and low fat dairy, alcohol moderation, and smoking cessation.;Monitor prescription use compliance.    Expected Outcomes Short Term: Continued assessment and intervention until BP is < 140/55mm HG in hypertensive participants. < 130/76mm HG in hypertensive participants with diabetes, heart failure or chronic kidney disease.;Long Term: Maintenance of blood pressure at goal levels.             Education:Diabetes - Individual verbal and written instruction to review signs/symptoms of diabetes, desired ranges of glucose level fasting, after meals and with exercise. Acknowledge that pre and post exercise glucose checks will be done for 3 sessions at entry of program.   Know Your Numbers and Heart Failure: - Group verbal and visual instruction to discuss disease risk factors for cardiac and pulmonary disease and treatment options.  Reviews associated critical values  for Overweight/Obesity, Hypertension, Cholesterol, and Diabetes.  Discusses basics of heart failure: signs/symptoms and treatments.  Introduces Heart Failure Zone chart for action plan for heart failure.  Written material given at graduation.   Core Components/Risk Factors/Patient Goals Review:   Goals and Risk Factor Review     Row Name 11/28/22 1731             Core Components/Risk Factors/Patient Goals Review   Personal Goals Review Improve shortness of breath with ADL's       Review Spoke to patient about their shortness of breath and what they can do to improve. Patient has been informed of breathing techniques when starting the program. Patient is informed to tell staff if they have had any med changes and that certain meds they are taking or not taking can be causing shortness of breath.       Expected Outcomes Short: Attend [facility] regularly to improve shortness of breath with ADL's. Long: maintain independence with ADL's                Core Components/Risk Factors/Patient Goals at Discharge (Final Review):   Goals and Risk Factor Review - 11/28/22 1731       Core Components/Risk Factors/Patient Goals Review   Personal Goals Review Improve shortness of breath with ADL's    Review Spoke to patient about their shortness of breath and what they can do to improve. Patient has been informed of breathing techniques when starting the program. Patient is informed to tell staff if they have had any med changes and that certain meds they are taking or not taking can be causing shortness of breath.    Expected Outcomes Short: Attend [facility] regularly to improve shortness of breath with ADL's. Long: maintain independence with ADL's             ITP Comments:  ITP Comments     Row Name 11/01/22 1009 11/08/22 1157 11/14/22 1739 12/05/22 0753 01/02/23 0719   ITP Comments Virtual Visit completed. Patient informed on EP and RD appointment and 6 Minute walk test. Patient also  informed of patient health questionnaires on My Chart. Patient Verbalizes understanding. Visit diagnosis can be found  in [facility] 10/15/2022. Completed and gym orientation. Initial ITP created and sent for review to Dr. Vida Rigger, Medical Director. First full day of exercise!  Patient was oriented to gym and equipment including functions, settings, policies, and procedures.  Patient's individual exercise prescription and treatment plan were reviewed.  All starting workloads were established based on the results of the 6 minute walk test done at initial orientation visit.  The plan for exercise progression was also introduced and progression will be customized based on patient's performance and goals. 30 Day review completed. Medical Director ITP review done, changes made as directed, and signed approval by Medical Director.    New to program 30 Day review completed. Medical Director ITP review done, changes made as directed, and signed approval by Medical Director.   Out since 6/5    Row Name 01/14/23 1506           ITP Comments Burgess would like to be discharged from the progam at this time due to scheduling with work and felt he could do this at home. He completed 12 of 36 sessions.                Comments: Early Discharge ITP

## 2023-01-14 NOTE — Telephone Encounter (Signed)
Called Pt to see when he planned to return to rehab. Pt informed us that he is busy with work and has been exercising at home. Pt would like to be discharged at this time.

## 2023-01-15 DIAGNOSIS — R7989 Other specified abnormal findings of blood chemistry: Secondary | ICD-10-CM | POA: Diagnosis not present

## 2023-01-15 DIAGNOSIS — M2550 Pain in unspecified joint: Secondary | ICD-10-CM | POA: Diagnosis not present

## 2023-01-15 DIAGNOSIS — D869 Sarcoidosis, unspecified: Secondary | ICD-10-CM | POA: Diagnosis not present

## 2023-01-15 DIAGNOSIS — Z7952 Long term (current) use of systemic steroids: Secondary | ICD-10-CM | POA: Diagnosis not present

## 2023-01-24 ENCOUNTER — Encounter: Payer: Self-pay | Admitting: Pulmonary Disease

## 2023-01-25 MED ORDER — DOXYCYCLINE HYCLATE 100 MG PO TABS: 100.0000 mg | ORAL_TABLET | Freq: Two times a day (BID) | ORAL | 0 refills | Status: DC

## 2023-02-03 NOTE — Progress Notes (Signed)
Office Visit Note  Patient: Steve Wang             Date of Birth: Mar 19, 1971           MRN: [id number]             PCP: Malva Limes, MD Referring: Malva Limes, MD Visit Date: 02/05/2023   Subjective:  Follow-up   History of Present Illness: Steve Wang is a 52 y.o. male here for follow up for sarcoidosis with cardiac and pulmonary involvement currently on Humira 40 mg subcu q. 14 days and prednisone 10 mg daily.  Since her last visit he thinks there is maybe slight improvement in his dyspnea on exertion but otherwise feels mostly the same.  He did break a toe on his right foot striking on a chair leg accidentally.  He is experiencing some hand stiffness and burning type pain in the morning sometimes lasting until noon.  Not associated with any increased swelling or visible changes.  No new rashes and he has not been sick with anything requiring antibiotics.  He is scheduled for an updated PET CT scan for cardiac involvement tomorrow.  Previous HPI 10/31/22 Steve Wang is a 52 y.o. male here for follow up for sarcoidosis with cardiac and pulmonary involvement on prednisone 15 mg daily and recently started Humira 40 mg subcu q. 14 days with 3 doses taken so far.  He has not noticed any particular side effect or intolerance with taking the medication.  He continues having some shortness of breath with exertion he can make it about 2 flights of stairs before getting winded.  He followed up with Dr. Jayme Cloud about asthma/COPD inhaler treatment and pulmonary rehab. Other symptom has been extensive body aches in the mornings in the past month. This has partially improved since the onset but still has hours of morning stiffness, and pain lasting throughout the day. He thinks decreasing prednisone from 20 to 15 mg daily may have actually improved this, and saw no benefit with additional ibuprofen.   Previous HPI 06/11/22 Steve Wang is a 52 y.o. male here for management of sarcoidosis. He  was originally diagnosed by bronchoscopy and possibly biopsy (not available for review to me at this time) in 2012 with initial treatment course of methotrexate and prednisone for a year but was not on long term immunosuppression and without disease activity. More recently concern due to decreased in EF under close monitoring with heart failure clinic possibly thought reltaed to COVID-19 infection and inflammation. Cardiac MRI in 2022 unremarkable for inflammatory changes as was PET/CT surveillance in March. However he continued to developed symptomatic disease this improved on methotrexate and prednisone but had return of joint pains with prednisone tapering. Repeat PET/CT obtained 11/15 now showing evidence of increased metabolic uptake in both ventricular wall as mediastinal lymph nodes concerning for active sarcoidosis.  His symptoms are not very noticeable at baseline he can climb about 3 flights of stairs without resting but would be winded by the end of it and recover after several minutes.  He has not having any cough or chest pain.  He has noticed episodic skin rash usually breaking out on his scalp in the center of the chest and seems to correspond with when he has more exertional dyspnea or mild coughing.  He has some joint pain in hands and feet in the morning she was improved after 5 to 10 minutes.  He does not see any visible swelling or erythema.  He  has never had any eye inflammation or vision change during this time. Recently this year he was resumed on methotrexate treatment but he did not tolerate this well felt a lot of brain fog and general malaise symptoms.  He never noticed much difference in symptoms with the methotrexate and so far is only seen a difference when prescribed steroids.  He stopped taking the methotrexate since about 5 weeks ago due to the side effects and not seeing a difference.  He is currently on a prolonged prednisone taper based on findings for active cardiac sarcoidosis  currently 30 mg daily plan to taper down and maintain on 10 mg dose until February follow-up repeat scan.   Imaging reviewed 06/06/22 PET/CT Cardiac PET/CT metabolic study with F-18 FDG and scanned on PET [facility]: 1. There are several segments ( 7 out of 17) with hypermetabolic activity suggestive of an active inflammatory process in the left ventricular myocardium. 2. Approximately 44% of the left ventricle is involved with predominantly mild/moderate/severe hypermetabolic activity. 3. There is evidence of abnormal metabolism in the right ventricle. 4. Overall findings are consistent with relapse of disease (Sarcoidosis).  Perfusion/Function.  Gated cardiac PET/CT rest myocardial perfusion study with [id number] demonstrates: 1. Normal myocardial perfusion. 2. Dilated left ventricle with normal left ventricular systolic function.  Non-diagnostic CT obtained for attenuation correction:  1. There are no discernible coronary artery calcifications. 2. There are extensive areas of extracardiac hypermetabolic activity noted on the limited field of view, specifically bilateral hilar, mediastinal lymph nodes, and extensive lung opacities, and retroperitoneal lymphnodes. Those findings were not present on most recent exam.    Review of Systems  Constitutional:  Positive for fatigue.  HENT:  Positive for mouth dryness. Negative for mouth sores.   Eyes:  Negative for dryness.  Respiratory:  Positive for shortness of breath.   Cardiovascular:  Negative for chest pain and palpitations.  Gastrointestinal:  Negative for blood in stool, constipation and diarrhea.  Endocrine: Negative for increased urination.  Genitourinary:  Negative for involuntary urination.  Musculoskeletal:  Positive for joint pain, joint pain, myalgias, morning stiffness and myalgias. Negative for gait problem, joint swelling, muscle weakness and muscle tenderness.  Skin:  Negative for color change, rash, hair loss and  sensitivity to sunlight.  Allergic/Immunologic: Negative for susceptible to infections.  Neurological:  Positive for dizziness and headaches.  Hematological:  Negative for swollen glands.  Psychiatric/Behavioral:  Negative for depressed mood and sleep disturbance. The patient is not nervous/anxious.     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 09/2018 Echo: EF 55-60%; b. 08/2020 Echo: EF 45-50%, grade 2 diastolic dysfunction; c. 10/2020 cMRI: EF 36%, mild to mod dil LV. Mod red RV fxn. No LGE/evidence of sarcoid. No signif  valvular dzs   NICM (nonischemic cardiomyopathy) (HCC)    a. 09/2018 Echo: EF 55-60%; b. 08/2020 Echo: EF 45-50%; c. 10/2020 cMRI: EF 36%, mild to mod dil LV. Mod red RV fxn. No LGE/evidence of sarcoid. No signif valvular dzs; d. 12/2020 Cor CTA: Ca2+= 0. Nl cors.   Pulmonary nodule    a. 12/2020 CT chest: 1.3cm posterior basal LUL nodule and 3mm LLL nodule - unchanged.   RBBB    Sarcoidosis    a. Dx 10/2020 in [place] - prev on steroids/methotrexate.    Family History  Problem Relation Age of Onset   COPD Mother    Colon cancer Neg Hx    Pancreatic cancer Neg Hx    Liver cancer Neg Hx    Esophageal cancer Neg Hx    Stomach cancer Neg Hx     Past Surgical History:  Procedure Laterality Date   BRONCHOSCOPY  11/09/2010   [facility] [place]   COLONOSCOPY     UPPER GI ENDOSCOPY     Social History   Social History Narrative   Not on file   Immunization History  Administered Date(s) Administered   Influenza,inj,Quad PF,6+ Mos 09/08/2012   Influenza-Unspecified 05/07/2011   Pneumococcal Polysaccharide-23 06/25/2018   Tdap 03/24/2013, 06/25/2018     Objective: Vital Signs: BP 121/80 (BP Location: Left Arm, Patient Position: Sitting, Cuff Size: Normal)   Pulse (!) 58   Resp 12   Ht 5\' 6"  (1.676 m)   Wt 174 lb (78.9 kg)   BMI 28.08 kg/m    Physical Exam Eyes:     Conjunctiva/sclera: Conjunctivae normal.  Cardiovascular:     Rate and Rhythm: Normal rate and regular rhythm.  Pulmonary:     Effort: Pulmonary effort is normal.     Breath sounds: Normal breath sounds.  Musculoskeletal:     Right lower leg: No edema.     Left lower leg: No edema.  Lymphadenopathy:     Cervical: No cervical adenopathy.  Skin:    General: Skin is warm and dry.     Findings: No rash.  Neurological:     Mental Status: He is alert.  Psychiatric:        Mood and Affect: Mood normal.      Musculoskeletal Exam:  Shoulders full ROM no tenderness or swelling Elbows full ROM no tenderness or swelling Wrists full ROM no tenderness or swelling Fingers full ROM, mild DIP joint nodules without tenderness to pressure or palpable swelling Knees full ROM no tenderness or swelling   Investigation: No additional findings.  Imaging: No results found.  Recent Labs: Lab Results  Component Value Date   WBC 7.3 10/31/2022   HGB 15.7 10/31/2022   PLT 227 10/31/2022   NA 137 10/31/2022   K 4.4 10/31/2022   CL 103 10/31/2022   CO2 26 10/31/2022   GLUCOSE 102 (H) 10/31/2022   BUN 21 10/31/2022   CREATININE 0.95 10/31/2022   BILITOT 0.4 10/31/2022   ALKPHOS 55 04/17/2022   AST 15 10/31/2022   ALT 20 10/31/2022   PROT 7.4  10/31/2022   ALBUMIN 4.6 04/17/2022   CALCIUM 9.6 10/31/2022   GFRAA 116 06/25/2018   QFTBGOLDPLUS NEGATIVE 06/11/2022    Speciality Comments: Humira started 09/26/22  Procedures:  No procedures performed Allergies: Patient has no known allergies.   Assessment / Plan:     Visit Diagnoses: Cardiac sarcoidosis - Plan: Sedimentation rate  Clinically appears well no peripheral edema clear lung exam not much  reported change in symptom severity.  Will check sed rate for systemic inflammation monitoring suspect this likely normal.  More useful will be the cardiac PET CT scan scheduled for tomorrow to follow-up from previous in February that showed increased activity.  If this is much improved could begin gradual prednisone taper.  Otherwise plan to continue Humira 40 mg subcu q. 14 days and prednisone 10 mg daily.  Generalized osteoarthritis  Suspect reported hand pain is related to his mild generalized osteoarthritis.  There are DIP joint nodules and no peripheral synovitis.  Less likely possibilities include beginnings of peripheral neuropathy, carpal tunnel syndrome, or inflammatory arthritis.  I recommended we get to a stage of discontinuing the prednisone he would be a better candidate for adding nonsteroidal anti-inflammatory drugs as needed.  High risk medication use - Plan: CBC with Differential/Platelet, COMPLETE METABOLIC PANEL WITH GFR  Checking CBC and CMP for medication monitoring on Humira.  No serious interval infections.  Orders: Orders Placed This Encounter  Procedures   Sedimentation rate   CBC with Differential/Platelet   COMPLETE METABOLIC PANEL WITH GFR   No orders of the defined types were placed in this encounter.    Follow-Up Instructions: Return in about 3 months (around 05/08/2023) for Sarcoidosis on ADA/GC f/u 3mos.   Fuller Plan, MD  Note - This record has been created using [employer].  Chart creation errors have been sought, but may not  always  have been located. Such creation errors do not reflect on  the standard of medical care.

## 2023-02-05 ENCOUNTER — Encounter: Payer: Self-pay | Admitting: Internal Medicine

## 2023-02-05 ENCOUNTER — Ambulatory Visit: Payer: BC Managed Care – PPO | Attending: Internal Medicine | Admitting: Internal Medicine

## 2023-02-05 VITALS — BP 121/80 | HR 58 | Resp 12 | Ht 66.0 in | Wt 174.0 lb

## 2023-02-05 DIAGNOSIS — D8685 Sarcoid myocarditis: Secondary | ICD-10-CM

## 2023-02-05 DIAGNOSIS — Z79899 Other long term (current) drug therapy: Secondary | ICD-10-CM | POA: Diagnosis not present

## 2023-02-05 DIAGNOSIS — M159 Polyosteoarthritis, unspecified: Secondary | ICD-10-CM | POA: Diagnosis not present

## 2023-02-06 DIAGNOSIS — R590 Localized enlarged lymph nodes: Secondary | ICD-10-CM | POA: Diagnosis not present

## 2023-02-06 DIAGNOSIS — K2100 Gastro-esophageal reflux disease with esophagitis, without bleeding: Secondary | ICD-10-CM | POA: Diagnosis not present

## 2023-02-06 DIAGNOSIS — J449 Chronic obstructive pulmonary disease, unspecified: Secondary | ICD-10-CM | POA: Diagnosis not present

## 2023-02-06 DIAGNOSIS — K219 Gastro-esophageal reflux disease without esophagitis: Secondary | ICD-10-CM | POA: Diagnosis not present

## 2023-02-06 DIAGNOSIS — D860 Sarcoidosis of lung: Secondary | ICD-10-CM | POA: Diagnosis not present

## 2023-02-06 DIAGNOSIS — D8685 Sarcoid myocarditis: Secondary | ICD-10-CM | POA: Diagnosis not present

## 2023-02-06 DIAGNOSIS — Z87891 Personal history of nicotine dependence: Secondary | ICD-10-CM | POA: Diagnosis not present

## 2023-02-06 DIAGNOSIS — I4510 Unspecified right bundle-branch block: Secondary | ICD-10-CM | POA: Diagnosis not present

## 2023-02-06 LAB — COMPLETE METABOLIC PANEL WITH GFR
AG Ratio: 1.6 (calc) (ref 1.0–2.5)
ALT: 32 U/L (ref 9–46)
AST: 20 U/L (ref 10–35)
Albumin: 4.6 g/dL (ref 3.6–5.1)
Alkaline phosphatase (APISO): 49 U/L (ref 35–144)
BUN: 19 mg/dL (ref 7–25)
CO2: 26 mmol/L (ref 20–32)
Calcium: 9.6 mg/dL (ref 8.6–10.3)
Chloride: 103 mmol/L (ref 98–110)
Creat: 0.88 mg/dL (ref 0.70–1.30)
Globulin: 2.9 g/dL (calc) (ref 1.9–3.7)
Glucose, Bld: 98 mg/dL (ref 65–99)
Potassium: 4.7 mmol/L (ref 3.5–5.3)
Sodium: 137 mmol/L (ref 135–146)
Total Bilirubin: 0.5 mg/dL (ref 0.2–1.2)
Total Protein: 7.5 g/dL (ref 6.1–8.1)
eGFR: 104 mL/min/{1.73_m2} (ref >=60)

## 2023-02-06 LAB — CBC WITH DIFFERENTIAL/PLATELET
Absolute Monocytes: 598 cells/uL (ref 200–950)
Basophils Absolute: 26 cells/uL (ref 0–200)
Basophils Relative: 0.3 %
Eosinophils Absolute: 150 cells/uL (ref 15–500)
Eosinophils Relative: 1.7 %
HCT: 46.8 % (ref 38.5–50.0)
Hemoglobin: 15.5 g/dL (ref 13.2–17.1)
Lymphs Abs: 1109 cells/uL (ref 850–3900)
MCH: 30.7 pg (ref 27.0–33.0)
MCHC: 33.1 g/dL (ref 32.0–36.0)
MCV: 92.7 fL (ref 80.0–100.0)
MPV: 11 fL (ref 7.5–12.5)
Monocytes Relative: 6.8 %
Neutro Abs: 6917 cells/uL (ref 1500–7800)
Neutrophils Relative %: 78.6 %
Platelets: 240 10*3/uL (ref 140–400)
RBC: 5.05 10*6/uL (ref 4.20–5.80)
RDW: 12.7 % (ref 11.0–15.0)
Total Lymphocyte: 12.6 %
WBC: 8.8 10*3/uL (ref 3.8–10.8)

## 2023-02-06 LAB — SEDIMENTATION RATE: Sed Rate: 2 mm/h (ref 0–20)

## 2023-02-07 ENCOUNTER — Encounter: Payer: Self-pay | Admitting: Internal Medicine

## 2023-02-07 ENCOUNTER — Encounter: Payer: Self-pay | Admitting: Pulmonary Disease

## 2023-02-08 ENCOUNTER — Telehealth: Payer: Self-pay | Admitting: Pulmonary Disease

## 2023-02-08 NOTE — Telephone Encounter (Signed)
Yes he PFTs because of change in symptoms.

## 2023-02-08 NOTE — Telephone Encounter (Signed)
  I never did take the antibiotics that she prescribed until today because I went to the beach on vacation for a week and it said not to take it and be in  the direct sunlight   Dr.Gonzalez, please advise. Thanks

## 2023-02-08 NOTE — Telephone Encounter (Signed)
This patient has an appt on 03/13/23 and he has had his PFT scheduled 3 times. He did have CPX done on 10/11/22. Does he still need to have PFT?

## 2023-02-08 NOTE — Telephone Encounter (Signed)
He was recently treated with antibiotics for sinusitis/bronchitis prior to this study. This can cause increased activity on the lymph nodes. I do not have the full report which would help to determine how much was the activity on the lymph nodes but by the snapshot we have it does not appear that it was too intense consistent more with a reactive process (likely residual from his recent infection). The 8th rib activity may be due to trauma. He is receiving advanced therapy for his sarcoid and should continue with the Humira. For completeness however, we can check urine and blood test for fungus to make sure nothing else has cropped up.

## 2023-02-08 NOTE — Telephone Encounter (Signed)
Dr. Jayme Cloud, please see attached imaging and advise. Thanks

## 2023-02-08 NOTE — Telephone Encounter (Signed)
When I called the patient to schedule his PFT appt again. He stated he was in the middle of something at work and would need to call me back

## 2023-02-08 NOTE — Telephone Encounter (Signed)
Per Dr. Jayme Cloud verbally. Hold off on labs and urine sample and complete abx.  Spoke to patient via telephone and relayed above message. He voiced his understanding.  Nothing further needed.

## 2023-02-08 NOTE — Telephone Encounter (Signed)
Proceed with taking antibiotic.

## 2023-02-11 ENCOUNTER — Other Ambulatory Visit: Payer: Self-pay

## 2023-02-11 DIAGNOSIS — D860 Sarcoidosis of lung: Secondary | ICD-10-CM

## 2023-02-11 DIAGNOSIS — D8685 Sarcoid myocarditis: Secondary | ICD-10-CM

## 2023-02-11 MED ORDER — ADALIMUMAB 40 MG/0.4ML ~~LOC~~ AJKT
40.0000 mg | AUTO-INJECTOR | SUBCUTANEOUS | 0 refills | Status: DC
Start: 2023-02-11 — End: 2023-04-29

## 2023-02-11 NOTE — Telephone Encounter (Signed)
Refill request received via fax from [facility] for Humira.  Last Fill: 11/28/2022  Labs: 02/05/2023 normal  TB Gold: 06/11/2022 Negative   Next Visit: 05/08/2023  Last Visit: 02/05/2023  [id number] sarcoidosis   Current Dose per office note 02/05/2023: Humira 40 mg subcu q. 14 days   Okay to refill Humira?

## 2023-02-12 NOTE — Telephone Encounter (Signed)
I spoke with the patient again today and his PFT has been rescheduled for the 4th time on 03/07/23 @ 8:00am [facility] [employer]

## 2023-02-19 ENCOUNTER — Other Ambulatory Visit: Payer: Self-pay | Admitting: Pulmonary Disease

## 2023-02-20 DIAGNOSIS — D8685 Sarcoid myocarditis: Secondary | ICD-10-CM | POA: Diagnosis not present

## 2023-02-20 DIAGNOSIS — I4510 Unspecified right bundle-branch block: Secondary | ICD-10-CM | POA: Diagnosis not present

## 2023-03-07 ENCOUNTER — Ambulatory Visit: Payer: BC Managed Care – PPO | Attending: Pulmonary Disease

## 2023-03-07 ENCOUNTER — Telehealth: Payer: Self-pay | Admitting: Pulmonary Disease

## 2023-03-07 NOTE — Telephone Encounter (Signed)
Lets cancel the PFTs for now I will reorder once he comes back for follow-up.

## 2023-03-07 NOTE — Telephone Encounter (Signed)
Dr. Jayme Cloud this patient had CPX done 10/11/22 and he has been scheduled 5 times to do PFT he either no shows, CXL, or reschedules but never gets the PFT done

## 2023-03-13 ENCOUNTER — Other Ambulatory Visit: Payer: Self-pay

## 2023-03-13 ENCOUNTER — Ambulatory Visit: Payer: BC Managed Care – PPO | Admitting: Pulmonary Disease

## 2023-03-13 DIAGNOSIS — R0602 Shortness of breath: Secondary | ICD-10-CM

## 2023-03-31 ENCOUNTER — Encounter: Payer: Self-pay | Admitting: Internal Medicine

## 2023-03-31 DIAGNOSIS — D860 Sarcoidosis of lung: Secondary | ICD-10-CM

## 2023-03-31 DIAGNOSIS — D8685 Sarcoid myocarditis: Secondary | ICD-10-CM

## 2023-04-01 NOTE — Telephone Encounter (Signed)
Patient is currently prescribed Humira and Prednisone. Please advise.

## 2023-04-23 MED ORDER — PREDNISONE 5 MG PO TABS
ORAL_TABLET | ORAL | 1 refills | Status: DC
Start: 2023-04-23 — End: 2023-09-13

## 2023-04-23 NOTE — Telephone Encounter (Signed)
We spoke regarding PET/CT can results and prednisone management. Steve Wang requested recommendation since earlier last month. Report shows no active cardiac inflammation. There are reactive lymph nodes and also metabolic uptake in one rib. No visible lung parenchymal involvement. This could potentially come from active sarcoidosis but less likely given the response elsewhere. May have been related to preceding URI symptoms also. He injured his rib horse riding beforehand as well possible nondisplaced fracture.  I believe he can safely start tapering the prednisone over several months while continuing Humira for maintenance treatment. Start decreasing by 2.5 mg daily dose every 4 weeks starting with 7.5 mg/day now. I am sending prescription for 5 mg tablets to [facility] [place] now.  He should let us know if any symptoms worsen following dose reduction. We also have a clinic follow up planned later this month for Humira treatment monitoring.

## 2023-04-24 NOTE — Progress Notes (Signed)
Office Visit Note  Patient: Steve Wang             Date of Birth: 03-16-1971           MRN: [id number]             PCP: Malva Limes, MD Referring: Malva Limes, MD Visit Date: 05/08/2023   Subjective:  Follow-up   History of Present Illness: Okley Mcgarey is a 52 y.o. male here for follow up for sarcoidosis with cardiac and pulmonary involvement currently on Humira 40 mg subcu q. 14 days and tapering prednisone currently 7.5 mg daily.  Since her last visit he had updated PET CT scan with cardiac disease appears quiescent.  He just followed up with his [facility] clinic visit on the ninth agreement with tapering steroid plan.  He still feels a high level of fatigue throughout the day and has dyspnea on exertion. He exercises at home regularly. He was recommended for a brain MRI to rule out neuro involvement due to persistent brain fog/cognitive difficulty. He took a course of doxycycline for a tooth infection but no other serious infections.  Previous HPI 02/05/2023 Tyreece Dienes is a 52 y.o. male here for follow up for sarcoidosis with cardiac and pulmonary involvement currently on Humira 40 mg subcu q. 14 days and prednisone 10 mg daily.  Since her last visit he thinks there is maybe slight improvement in his dyspnea on exertion but otherwise feels mostly the same.  He did break a toe on his right foot striking on a chair leg accidentally.  He is experiencing some hand stiffness and burning type pain in the morning sometimes lasting until noon.  Not associated with any increased swelling or visible changes.  No new rashes and he has not been sick with anything requiring antibiotics.  He is scheduled for an updated PET CT scan for cardiac involvement tomorrow.   Previous HPI 10/31/22 Shenan Tipple is a 52 y.o. male here for follow up for sarcoidosis with cardiac and pulmonary involvement on prednisone 15 mg daily and recently started Humira 40 mg subcu q. 14 days with 3 doses taken so far.  He  has not noticed any particular side effect or intolerance with taking the medication.  He continues having some shortness of breath with exertion he can make it about 2 flights of stairs before getting winded.  He followed up with Dr. Jayme Cloud about asthma/COPD inhaler treatment and pulmonary rehab. Other symptom has been extensive body aches in the mornings in the past month. This has partially improved since the onset but still has hours of morning stiffness, and pain lasting throughout the day. He thinks decreasing prednisone from 20 to 15 mg daily may have actually improved this, and saw no benefit with additional ibuprofen.   Previous HPI 06/11/22 Tayari Ramos is a 52 y.o. male here for management of sarcoidosis. He was originally diagnosed by bronchoscopy and possibly biopsy (not available for review to me at this time) in 2012 with initial treatment course of methotrexate and prednisone for a year but was not on long term immunosuppression and without disease activity. More recently concern due to decreased in EF under close monitoring with heart failure clinic possibly thought reltaed to COVID-19 infection and inflammation. Cardiac MRI in 2022 unremarkable for inflammatory changes as was PET/CT surveillance in March. However he continued to developed symptomatic disease this improved on methotrexate and prednisone but had return of joint pains with prednisone tapering. Repeat PET/CT obtained 11/15  now showing evidence of increased metabolic uptake in both ventricular wall as mediastinal lymph nodes concerning for active sarcoidosis.  His symptoms are not very noticeable at baseline he can climb about 3 flights of stairs without resting but would be winded by the end of it and recover after several minutes.  He has not having any cough or chest pain.  He has noticed episodic skin rash usually breaking out on his scalp in the center of the chest and seems to correspond with when he has more exertional  dyspnea or mild coughing.  He has some joint pain in hands and feet in the morning she was improved after 5 to 10 minutes.  He does not see any visible swelling or erythema.  He has never had any eye inflammation or vision change during this time. Recently this year he was resumed on methotrexate treatment but he did not tolerate this well felt a lot of brain fog and general malaise symptoms.  He never noticed much difference in symptoms with the methotrexate and so far is only seen a difference when prescribed steroids.  He stopped taking the methotrexate since about 5 weeks ago due to the side effects and not seeing a difference.  He is currently on a prolonged prednisone taper based on findings for active cardiac sarcoidosis currently 30 mg daily plan to taper down and maintain on 10 mg dose until February follow-up repeat scan.   Imaging reviewed 06/06/22 PET/CT Cardiac PET/CT metabolic study with F-18 FDG and scanned on PET [facility]: 1. There are several segments ( 7 out of 17) with hypermetabolic activity suggestive of an active inflammatory process in the left ventricular myocardium. 2. Approximately 44% of the left ventricle is involved with predominantly mild/moderate/severe hypermetabolic activity. 3. There is evidence of abnormal metabolism in the right ventricle. 4. Overall findings are consistent with relapse of disease (Sarcoidosis).  Perfusion/Function.  Gated cardiac PET/CT rest myocardial perfusion study with Rb-82 demonstrates: 1. Normal myocardial perfusion. 2. Dilated left ventricle with normal left ventricular systolic function.  Non-diagnostic CT obtained for attenuation correction:  1. There are no discernible coronary artery calcifications. 2. There are extensive areas of extracardiac hypermetabolic activity noted on the limited field of view, specifically bilateral hilar, mediastinal lymph nodes, and extensive lung opacities, and retroperitoneal lymphnodes. Those  findings were not present on most recent exam.   Review of Systems  Constitutional:  Positive for fatigue.  Eyes:  Negative for visual disturbance.  Respiratory:  Negative for difficulty breathing.   Musculoskeletal:  Negative for joint swelling.  Skin:  Negative for rash.  Hematological:  Negative for swollen glands.    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 09/2018 Echo: EF 55-60%; b. 08/2020 Echo: EF  45-50%, grade 2 diastolic dysfunction; c. 10/2020 cMRI: EF 36%, mild to mod dil LV. Mod red RV fxn. No LGE/evidence of sarcoid. No signif valvular dzs   NICM (nonischemic cardiomyopathy) (HCC)    a. 09/2018 Echo: EF 55-60%; b. 08/2020 Echo: EF 45-50%; c. 10/2020 cMRI: EF 36%, mild to mod dil LV. Mod red RV fxn. No LGE/evidence of sarcoid. No signif valvular dzs; d. 12/2020 Cor CTA: Ca2+= 0. Nl cors.   Pulmonary nodule    a. 12/2020 CT chest: 1.3cm posterior basal LUL nodule and 3mm LLL nodule - unchanged.   RBBB    Sarcoidosis    a. Dx 10/2020 in [place] - prev on steroids/methotrexate.    Family History  Problem Relation Age of Onset   COPD Mother    Colon cancer Neg Hx    Pancreatic cancer Neg Hx    Liver cancer Neg Hx    Esophageal cancer Neg Hx     Stomach cancer Neg Hx    Past Surgical History:  Procedure Laterality Date   BRONCHOSCOPY  11/09/2010   [facility] [place]   COLONOSCOPY     UPPER GI ENDOSCOPY     Social History   Social History Narrative   Not on file   Immunization History  Administered Date(s) Administered   Influenza,inj,Quad PF,6+ Mos 09/08/2012   Influenza-Unspecified 05/07/2011   Pneumococcal Polysaccharide-23 06/25/2018   Tdap 03/24/2013, 06/25/2018     Objective: Vital Signs: BP 119/82 (BP Location: Left Arm, Patient Position: Sitting, Cuff Size: Normal)   Pulse 70   Resp 16   Ht 5\' 6"  (1.676 m)   Wt 172 lb 9.6 oz (78.3 kg)   BMI 27.86 kg/m    Physical Exam HENT:     Mouth/Throat:     Mouth: Mucous membranes are moist.     Pharynx: Oropharynx is clear.  Eyes:     Conjunctiva/sclera: Conjunctivae normal.  Cardiovascular:     Rate and Rhythm: Normal rate and regular rhythm.  Pulmonary:     Effort: Pulmonary effort is normal.     Breath sounds: Normal breath sounds.  Musculoskeletal:     Right lower leg: No edema.     Left lower leg: No edema.  Lymphadenopathy:     Cervical: No cervical adenopathy.  Skin:    General: Skin is warm and dry.     Findings: No rash.  Neurological:     Mental Status: He is alert.  Psychiatric:        Mood and Affect: Mood normal.      Musculoskeletal Exam:  Shoulders full ROM no tenderness or swelling Elbows full ROM no tenderness or swelling Wrists full ROM no tenderness or swelling Fingers full ROM no tenderness or swelling Knees full ROM no tenderness or swelling   Investigation: No additional findings.  Imaging: No results found.  Recent Labs: Lab Results  Component Value Date   WBC 8.8 02/05/2023   HGB 15.5 02/05/2023   PLT 240 02/05/2023   NA 137 02/05/2023   K 4.7 02/05/2023   CL 103 02/05/2023   CO2 26 02/05/2023   GLUCOSE 98 02/05/2023   BUN 19 02/05/2023   CREATININE 0.88 02/05/2023   BILITOT 0.5 02/05/2023   ALKPHOS  55 04/17/2022   AST 20 02/05/2023   ALT 32 02/05/2023   PROT 7.5 02/05/2023   ALBUMIN 4.6 04/17/2022   CALCIUM 9.6 02/05/2023   GFRAA 116 06/25/2018   QFTBGOLDPLUS NEGATIVE 06/11/2022    Speciality Comments: Humira started  09/26/22  Procedures:  No procedures performed Allergies: Patient has no known allergies.   Assessment / Plan:     Visit Diagnoses: Cardiac sarcoidosis - Plan: Sedimentation rate  Inflammatory disease appears to be well-controlled.  Will recheck sedimentation rate make sure this remains at or near normal before proceeding on prednisone tapering.  Plan to continue Humira 40 mg subcu q. 14 days.  Can continue prednisone taper currently 7.5 mg daily with decrease by 2.5 mg increment every month until off completely or has complications.  High risk medication use - Humira 40 mg subcu q. 14 days - Plan: CBC with Differential/Platelet, COMPLETE METABOLIC PANEL WITH GFR, QuantiFERON-TB Gold Plus  Checking CBC and CMP and QuantiFERON for medication monitoring on continued use of Humira.  No serious interval infections.  Long term (current) use of systemic steroids   Has been on prolonged steroids previously documented adrenal insufficiency associated with this.  Discussed 10 mg or less of prednisone is within physiologic range but we can monitor symptomatically. Subsequently requested assessing for adrenal function status.  Recommended to check for ACTH and cortisol morning serum levels once he decreases his down to prednisone 5 mg daily.  Generalized osteoarthritis   Orders: Orders Placed This Encounter  Procedures   CBC with Differential/Platelet   COMPLETE METABOLIC PANEL WITH GFR   Sedimentation rate   QuantiFERON-TB Gold Plus   No orders of the defined types were placed in this encounter.    Follow-Up Instructions: Return in about 3 months (around 08/08/2023) for Sarcoidosis on ADA/GC taper off f/u 3mos.   Fuller Plan, MD  Note - This record has  been created using [employer].  Chart creation errors have been sought, but may not always  have been located. Such creation errors do not reflect on  the standard of medical care.

## 2023-04-26 ENCOUNTER — Encounter: Payer: Self-pay | Admitting: *Deleted

## 2023-04-29 ENCOUNTER — Other Ambulatory Visit: Payer: Self-pay | Admitting: *Deleted

## 2023-04-29 DIAGNOSIS — D8685 Sarcoid myocarditis: Secondary | ICD-10-CM

## 2023-04-29 DIAGNOSIS — D860 Sarcoidosis of lung: Secondary | ICD-10-CM

## 2023-04-29 MED ORDER — ADALIMUMAB 40 MG/0.4ML ~~LOC~~ AJKT
40.0000 mg | AUTO-INJECTOR | SUBCUTANEOUS | 0 refills | Status: DC
Start: 2023-04-29 — End: 2023-07-08

## 2023-04-29 NOTE — Telephone Encounter (Signed)
Refill request received via fax from [facility] for Humira   Last Fill: 02/11/2023  Labs: 02/05/2023  TB Gold: 06/11/2022 Neg    Next Visit: 05/08/2023  Last Visit: 02/05/2023  [id number] sarcoidosis   Current Dose per office note 02/05/2023: Humira 40 mg subcu q. 14 days   Okay to refill Humira?

## 2023-05-01 DIAGNOSIS — Z79899 Other long term (current) drug therapy: Secondary | ICD-10-CM | POA: Diagnosis not present

## 2023-05-01 DIAGNOSIS — D8685 Sarcoid myocarditis: Secondary | ICD-10-CM | POA: Diagnosis not present

## 2023-05-01 DIAGNOSIS — D860 Sarcoidosis of lung: Secondary | ICD-10-CM | POA: Diagnosis not present

## 2023-05-01 DIAGNOSIS — D84821 Immunodeficiency due to drugs: Secondary | ICD-10-CM | POA: Diagnosis not present

## 2023-05-01 DIAGNOSIS — Z7952 Long term (current) use of systemic steroids: Secondary | ICD-10-CM | POA: Diagnosis not present

## 2023-05-01 DIAGNOSIS — Z87891 Personal history of nicotine dependence: Secondary | ICD-10-CM | POA: Diagnosis not present

## 2023-05-08 ENCOUNTER — Encounter: Payer: Self-pay | Admitting: Internal Medicine

## 2023-05-08 ENCOUNTER — Telehealth: Payer: Self-pay | Admitting: Internal Medicine

## 2023-05-08 ENCOUNTER — Ambulatory Visit: Payer: BC Managed Care – PPO | Attending: Internal Medicine | Admitting: Internal Medicine

## 2023-05-08 VITALS — BP 119/82 | HR 70 | Resp 16 | Ht 66.0 in | Wt 172.6 lb

## 2023-05-08 DIAGNOSIS — Z7952 Long term (current) use of systemic steroids: Secondary | ICD-10-CM | POA: Diagnosis not present

## 2023-05-08 DIAGNOSIS — M159 Polyosteoarthritis, unspecified: Secondary | ICD-10-CM

## 2023-05-08 DIAGNOSIS — D8685 Sarcoid myocarditis: Secondary | ICD-10-CM | POA: Diagnosis not present

## 2023-05-08 DIAGNOSIS — E2740 Unspecified adrenocortical insufficiency: Secondary | ICD-10-CM

## 2023-05-08 DIAGNOSIS — J029 Acute pharyngitis, unspecified: Secondary | ICD-10-CM | POA: Diagnosis not present

## 2023-05-08 DIAGNOSIS — Z79899 Other long term (current) drug therapy: Secondary | ICD-10-CM

## 2023-05-08 DIAGNOSIS — J020 Streptococcal pharyngitis: Secondary | ICD-10-CM | POA: Diagnosis not present

## 2023-05-08 NOTE — Telephone Encounter (Signed)
Patient states he is reducing his prednisone. Patient states his adrenal gland is not working. Patient states he would like to check his cortisol levels to ensure there are no complications from stopping prednisone. Please advise.

## 2023-05-08 NOTE — Telephone Encounter (Signed)
Pt came in after his appointment asking to speak with Dr. Dimple Casey. Pt states he forgot to mention something in his appointment about his blood work.

## 2023-05-08 NOTE — Telephone Encounter (Signed)
Attempted to contact the patient and unable to leave a message, voicemail is full.

## 2023-05-08 NOTE — Telephone Encounter (Signed)
We can check this but it would need to be a different sample. ACTH and cortisol would be the tests-for the current level and the signal hormone. These are usually checked at 8:00-9:00 AM for the result to be reliable since it varies depending on time of day.

## 2023-05-09 DIAGNOSIS — E2740 Unspecified adrenocortical insufficiency: Secondary | ICD-10-CM | POA: Insufficient documentation

## 2023-05-09 DIAGNOSIS — Z7952 Long term (current) use of systemic steroids: Secondary | ICD-10-CM | POA: Insufficient documentation

## 2023-05-09 NOTE — Telephone Encounter (Signed)
Contacted the patient and advised Recommend aiming for November 18 or close to this would be about halfway through the prednisone taper. He should aim to get the blood drawn ideally 8 to 9 AM to be accurate. Patient verbalized understanding.  Patient states he is going to [facility] on [address] in [place]. Contacted [facility] and they said they will be able to see the orders once they are released. Will send patient a mychart message.

## 2023-05-09 NOTE — Telephone Encounter (Signed)
Signed orders for ACTH and cortisol level. Please confirm these will be available at his local [facility] facility.    Associated diagnosis of adrenal insufficiency and long-term current use of systemic steroids.    Recommend aiming for November 18 or close to this would be about halfway through the prednisone taper.  He should aim to get the blood drawn ideally 8 to 9 AM to be accurate.

## 2023-05-09 NOTE — Telephone Encounter (Signed)
Contacted the patient and advised We can check this but it would need to be a different sample. ACTH and cortisol would be the tests-for the current level and the signal hormone. These are usually checked at 8:00-9:00 AM for the result to be reliable since it varies depending on time of day. Patient verbalized understanding.   Patient states he would like to get the levels checked in a month at [facility]. Patient states he would like to get them checked in a month because he feels there will not be much of a difference yet. Please associate a diagnoses to the tests and then review and sign. Patient would like a message sent through mychart with the date and tests so that he can remember to go to [facility]. Please advise.

## 2023-05-10 LAB — CBC WITH DIFFERENTIAL/PLATELET
Absolute Lymphocytes: 800 {cells}/uL — ABNORMAL LOW (ref 850–3900)
Absolute Monocytes: 632 {cells}/uL (ref 200–950)
Basophils Absolute: 40 {cells}/uL (ref 0–200)
Basophils Relative: 0.5 %
Eosinophils Absolute: 224 {cells}/uL (ref 15–500)
Eosinophils Relative: 2.8 %
HCT: 49.5 % (ref 38.5–50.0)
Hemoglobin: 16.1 g/dL (ref 13.2–17.1)
MCH: 30.2 pg (ref 27.0–33.0)
MCHC: 32.5 g/dL (ref 32.0–36.0)
MCV: 92.9 fL (ref 80.0–100.0)
MPV: 12.1 fL (ref 7.5–12.5)
Monocytes Relative: 7.9 %
Neutro Abs: 6304 {cells}/uL (ref 1500–7800)
Neutrophils Relative %: 78.8 %
Platelets: 207 10*3/uL (ref 140–400)
RBC: 5.33 10*6/uL (ref 4.20–5.80)
RDW: 11.7 % (ref 11.0–15.0)
Total Lymphocyte: 10 %
WBC: 8 10*3/uL (ref 3.8–10.8)

## 2023-05-10 LAB — COMPLETE METABOLIC PANEL WITH GFR
AG Ratio: 1.4 (calc) (ref 1.0–2.5)
ALT: 20 U/L (ref 9–46)
AST: 15 U/L (ref 10–35)
Albumin: 4.4 g/dL (ref 3.6–5.1)
Alkaline phosphatase (APISO): 46 U/L (ref 35–144)
BUN: 23 mg/dL (ref 7–25)
CO2: 25 mmol/L (ref 20–32)
Calcium: 9.6 mg/dL (ref 8.6–10.3)
Chloride: 102 mmol/L (ref 98–110)
Creat: 0.92 mg/dL (ref 0.70–1.30)
Globulin: 3.2 g/dL (ref 1.9–3.7)
Glucose, Bld: 96 mg/dL (ref 65–99)
Potassium: 4.1 mmol/L (ref 3.5–5.3)
Sodium: 137 mmol/L (ref 135–146)
Total Bilirubin: 0.6 mg/dL (ref 0.2–1.2)
Total Protein: 7.6 g/dL (ref 6.1–8.1)
eGFR: 101 mL/min/{1.73_m2} (ref >=60)

## 2023-05-10 LAB — QUANTIFERON-TB GOLD PLUS
Mitogen-NIL: 8.23 [IU]/mL
NIL: 0.02 [IU]/mL
QuantiFERON-TB Gold Plus: NEGATIVE
TB1-NIL: 0 [IU]/mL
TB2-NIL: 0 [IU]/mL

## 2023-05-10 LAB — SEDIMENTATION RATE: Sed Rate: 2 mm/h (ref 0–20)

## 2023-05-24 ENCOUNTER — Other Ambulatory Visit: Payer: Self-pay | Admitting: Internal Medicine

## 2023-05-24 DIAGNOSIS — D860 Sarcoidosis of lung: Secondary | ICD-10-CM

## 2023-05-24 DIAGNOSIS — D8685 Sarcoid myocarditis: Secondary | ICD-10-CM

## 2023-05-28 ENCOUNTER — Encounter (HOSPITAL_BASED_OUTPATIENT_CLINIC_OR_DEPARTMENT_OTHER): Payer: BC Managed Care – PPO

## 2023-05-28 ENCOUNTER — Encounter (HOSPITAL_BASED_OUTPATIENT_CLINIC_OR_DEPARTMENT_OTHER): Payer: Self-pay

## 2023-05-29 ENCOUNTER — Encounter (HOSPITAL_BASED_OUTPATIENT_CLINIC_OR_DEPARTMENT_OTHER): Payer: BC Managed Care – PPO

## 2023-05-29 ENCOUNTER — Encounter (HOSPITAL_BASED_OUTPATIENT_CLINIC_OR_DEPARTMENT_OTHER): Payer: Self-pay

## 2023-05-31 ENCOUNTER — Ambulatory Visit (HOSPITAL_BASED_OUTPATIENT_CLINIC_OR_DEPARTMENT_OTHER): Payer: BC Managed Care – PPO | Admitting: Pulmonary Disease

## 2023-05-31 DIAGNOSIS — R0602 Shortness of breath: Secondary | ICD-10-CM

## 2023-05-31 LAB — PULMONARY FUNCTION TEST
DL/VA % pred: 96 %
DL/VA: 4.35 ml/min/mmHg/L
DLCO cor % pred: 89 %
DLCO cor: 22.95 ml/min/mmHg
DLCO unc % pred: 92 %
DLCO unc: 23.87 ml/min/mmHg
FEF 25-75 Pre: 0.74 L/s
FEF2575-%Pred-Pre: 24 %
FEV1-%Pred-Pre: 50 %
FEV1-Pre: 1.72 L
FEV1FVC-%Pred-Pre: 61 %
FEV6-%Pred-Pre: 82 %
FEV6-Pre: 3.48 L
FEV6FVC-%Pred-Pre: 100 %
FVC-%Pred-Pre: 82 %
FVC-Pre: 3.6 L
Pre FEV1/FVC ratio: 48 %
Pre FEV6/FVC Ratio: 97 %
RV % pred: 146 %
RV: 2.69 L
TLC % pred: 107 %
TLC: 6.59 L

## 2023-05-31 NOTE — Patient Instructions (Signed)
Full PFT Performed today minus Post Spirometry;

## 2023-05-31 NOTE — Progress Notes (Signed)
Full PFT Performed today minus Post Spirometry;

## 2023-06-06 DIAGNOSIS — R4182 Altered mental status, unspecified: Secondary | ICD-10-CM | POA: Diagnosis not present

## 2023-06-06 DIAGNOSIS — D869 Sarcoidosis, unspecified: Secondary | ICD-10-CM | POA: Diagnosis not present

## 2023-06-07 IMAGING — CT CT CHEST W/ CM
2 of 4 series · 15 of 36 positions shown, 18 images · IV contrast (agent unspecified)
Comparison: 08/30/2020

CLINICAL DATA: Sarcoidosis.

EXAM:
CT CHEST WITH CONTRAST
TECHNIQUE: Multidetector CT imaging of the chest was performed during
intravenous contrast administration.

[Series 2: axial st · axial · 0.66mm/px · z∈[-699,-401]mm · 12 of 177 slices shown, 15 images]
[im 14/177  mediastinal]
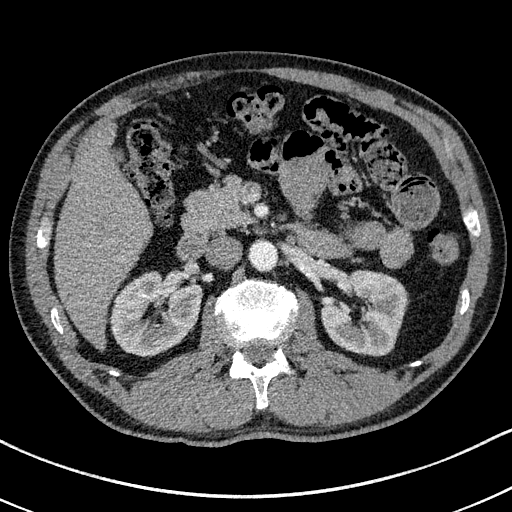
[im 14/177  lung]
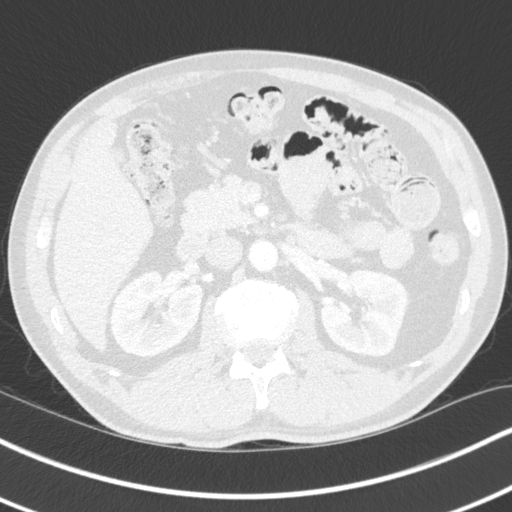
[im 28/177  lung]
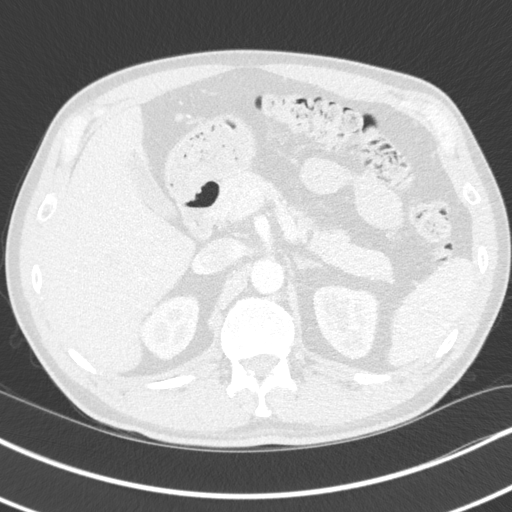
[im 41/177  lung]
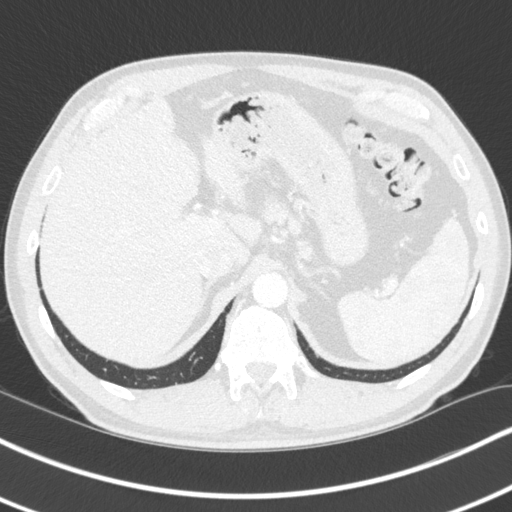
[im 55/177  lung]
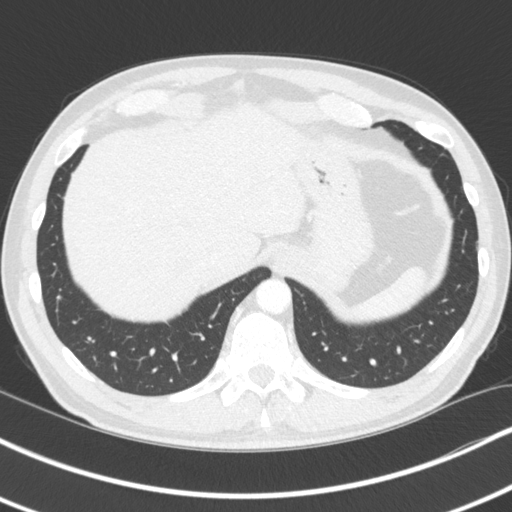
[im 68/177  mediastinal]
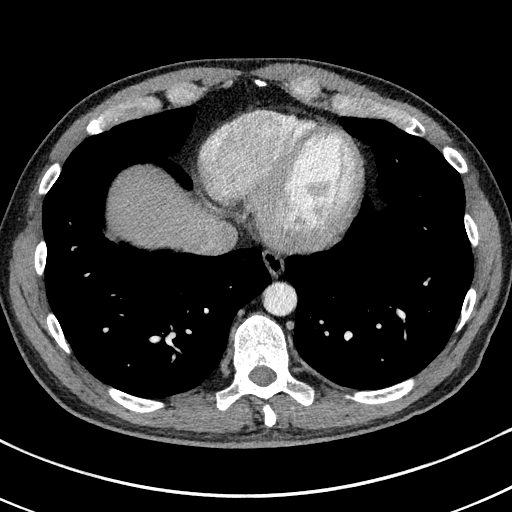
[im 68/177  lung]
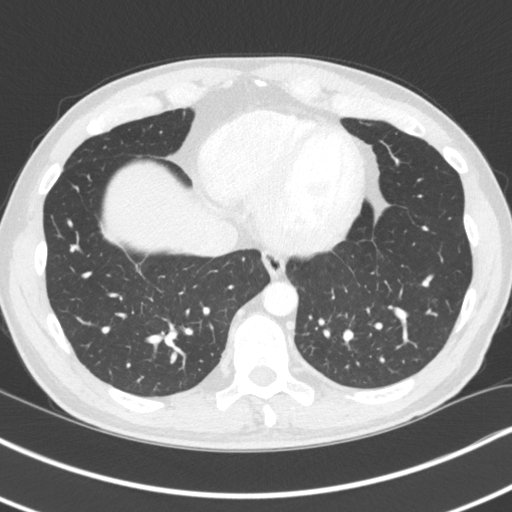
[im 82/177  lung]
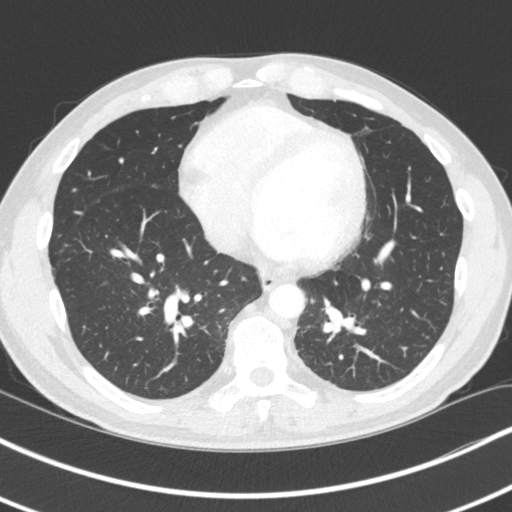
[im 95/177  lung]
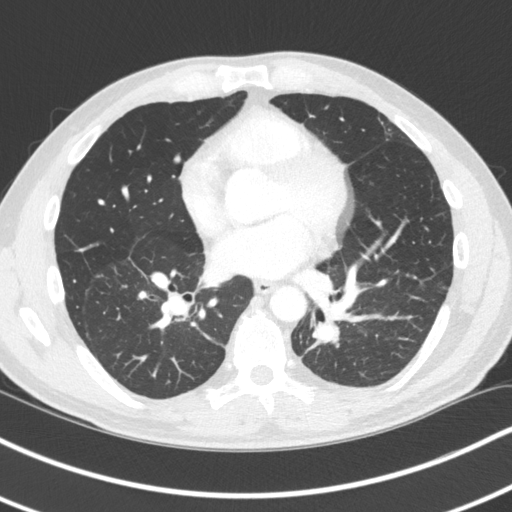
[im 109/177  lung]
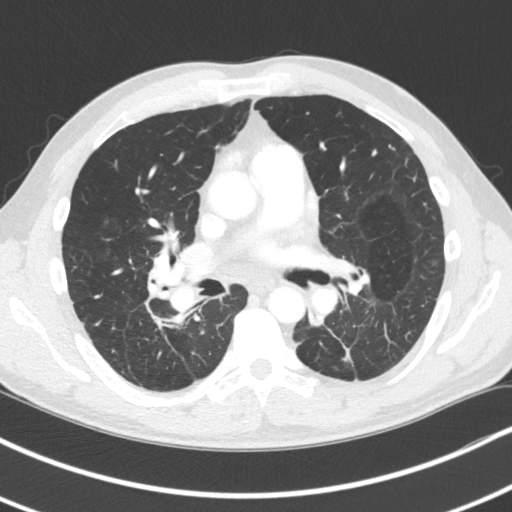
[im 122/177  mediastinal]
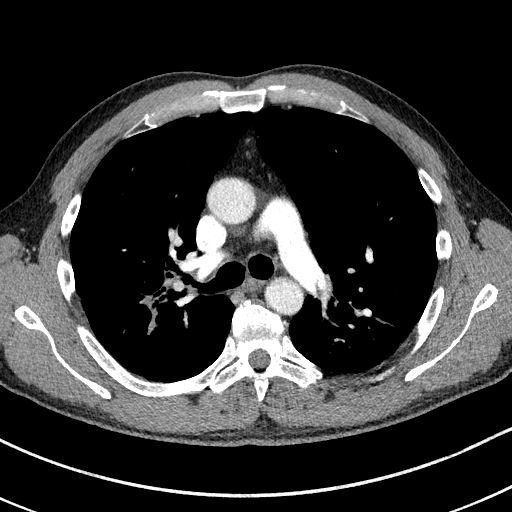
[im 122/177  lung]
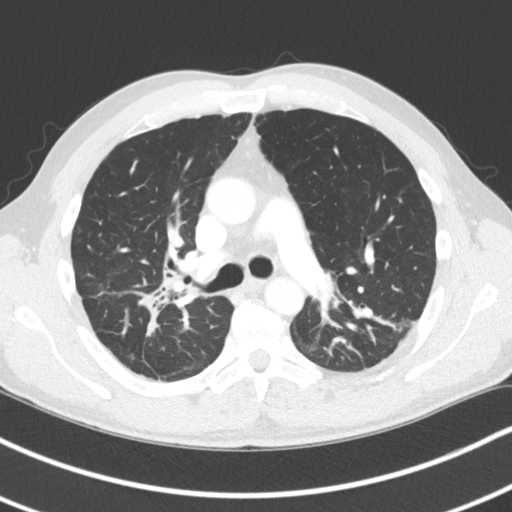
[im 136/177  lung]
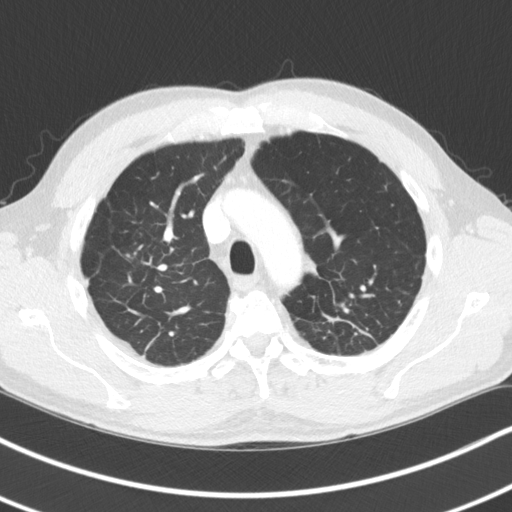
[im 149/177  lung]
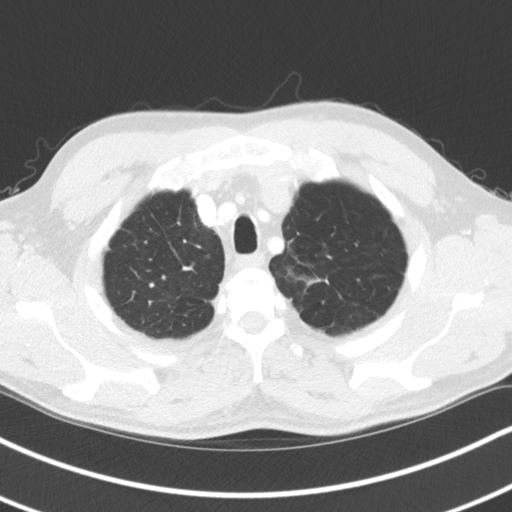
[im 163/177  lung]
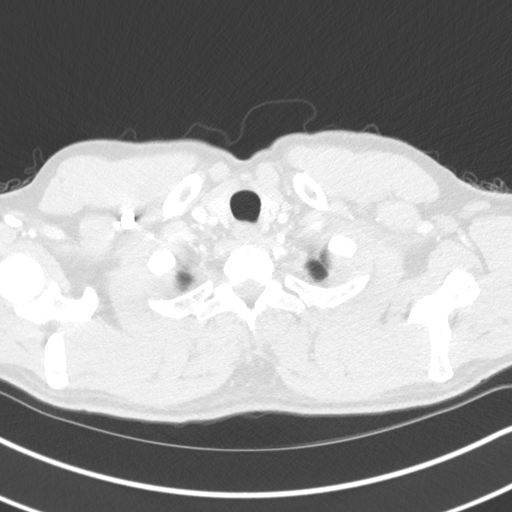

[Series 5: coronal · coronal · 0.69mm/px · 3 of 132 slices shown]
[im 27/132  lung]
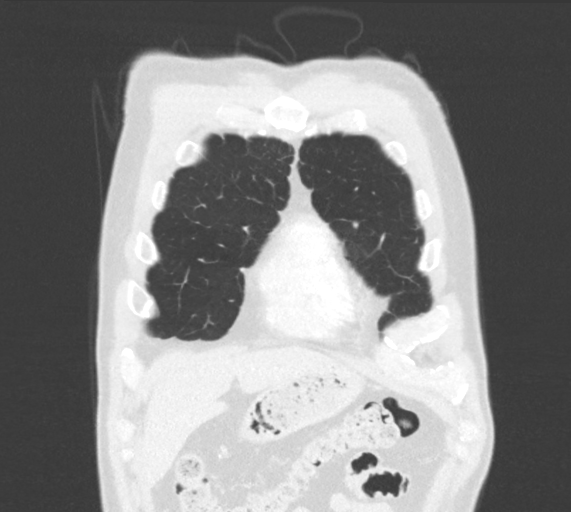
[im 53/132  lung]
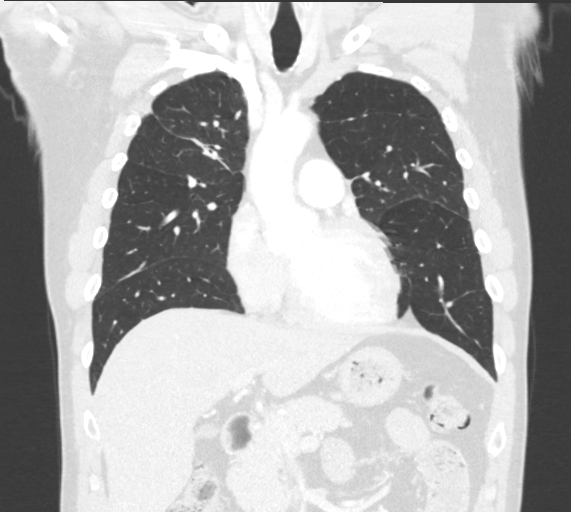
[im 79/132  lung]
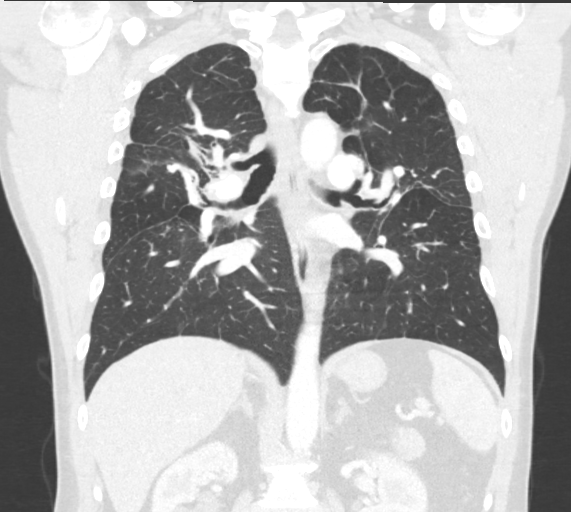

[15 of 36 positions shown; findings below may reference images not displayed]

RADIATION DOSE REDUCTION: This exam was performed according to the
departmental dose-optimization program which includes automated
exposure control, adjustment of the mA and/or kV according to
patient size and/or use of iterative reconstruction technique.

CONTRAST:  75mL OMNIPAQUE IOHEXOL 300 MG/ML  SOLN
FINDINGS: Cardiovascular: The heart size is normal. No substantial pericardial
effusion. No thoracic aortic aneurysm.

Mediastinum/Nodes: No mediastinal lymphadenopathy. There is no hilar
lymphadenopathy. The esophagus has normal imaging features. There is
no axillary lymphadenopathy.

Lungs/Pleura: Centrilobular emphsyema noted. Scattered areas of
architectural distortion and scarring are again noted in the lungs
bilaterally, similar to prior 3 mm left lower lobe pulmonary nodule
identified previously is stable on image 90/3 today. No new
suspicious nodule or mass. No focal airspace consolidation. No
pleural effusion.

Upper Abdomen: Unremarkable.

Musculoskeletal: No worrisome lytic or sclerotic osseous
abnormality.
IMPRESSION: 1. Stable exam. No new or progressive findings.
2. Scattered areas of architectural distortion and scarring
bilaterally, compatible with the reported history of sarcoidosis.
3. Stable 3 mm left lower lobe pulmonary nodule. 1 year of imaging
stability is consistent with benign etiology. No additional imaging
follow-up of this particular nodule indicated.
4. Emphysema ([id number]).

## 2023-06-13 ENCOUNTER — Ambulatory Visit (INDEPENDENT_AMBULATORY_CARE_PROVIDER_SITE_OTHER): Payer: BC Managed Care – PPO | Admitting: Pulmonary Disease

## 2023-06-13 ENCOUNTER — Encounter: Payer: Self-pay | Admitting: Pulmonary Disease

## 2023-06-13 VITALS — BP 124/82 | HR 75 | Temp 97.7°F | Ht 66.0 in | Wt 183.0 lb

## 2023-06-13 DIAGNOSIS — D869 Sarcoidosis, unspecified: Secondary | ICD-10-CM

## 2023-06-13 DIAGNOSIS — J449 Chronic obstructive pulmonary disease, unspecified: Secondary | ICD-10-CM

## 2023-06-13 DIAGNOSIS — R0602 Shortness of breath: Secondary | ICD-10-CM

## 2023-06-13 NOTE — Progress Notes (Signed)
Subjective:    Patient ID: Steve Wang, male    DOB: [birth year], 52 y.o.   MRN: [id number]  Patient Care Team: Malva Limes, MD as PCP - General (Family Medicine) Antonieta Iba, MD as PCP - Cardiology (Cardiology) Salena Saner, MD as Consulting Physician (Pulmonary Disease)  Chief Complaint  Patient presents with   Follow-up    DOE. No Wheezing. Cough with clear sputum this morning.     BACKGROUND/INTERVAL:Steve Wang is a 52 year old very complex former smoker with 25-pack-year history of smoking and a history of sarcoidosis first diagnosed in 2012 at the [facility] [place] by lung biopsy.  The patient was last seen here on 11 Dec 2022.  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  He has been started by [facility] rheumatology in March 2024 on Humira for management of his cardiac sarcoid as he has continued to show activity on methotrexate and prednisone.  He follows with cardiac PET/CTs at [facility]  He also follows with cardiology through [facility]  HPI Discussed the use of AI scribe software for clinical note transcription with the patient, who gave verbal consent to proceed.  History of Present Illness   The patient, with a history of COPD and sarcoidosis, reports a stable condition with a slight decline in pulmonary function noted on PFTs. He continues to use Stiolto, which provides intermittent relief. He does not use albuterol due to associated tachycardia, a side effect he also experienced with levoalbuterol.  The patient also has a history of cardiac sarcoidosis, for which he is on Humira and prednisone. The patient is currently on a dose of 5mg  of prednisone.  In terms of general health, the patient denies any recent fevers or chills. He reports persistent fatigue, which he has learned to live with. The patient has not received a flu shot due to past experiences of illness post-vaccination.  The  patient's overall condition appears to be stable with a slight decline in pulmonary function and ongoing fatigue. He is managing his multiple conditions with a combination of medications, including Stiolto, Humira, and prednisone.     He had pulmonary function testing performed on 31 May 2023 and we discussed these findings more consistent with COPD than with his sarcoidosis.  DATA: 10/13/2010 CT chest: Report only, performed at [facility] [place]: Upper lung predominant parenchymal changes, scarring and traction bronchiectasis with small peribronchial and perifissural nodules.  Enlarged bilateral hilar and mediastinal lymph nodes, findings all suggestive of sarcoidosis. 11/03/2010: ACE level 98, eosinophils elevated 8.1% 11/09/2010 Bronchoscopy specimens: [facility] [place], noncaseating granulomas right upper lobe, fungal culture negative for nocardia, Doratomyces species isolated, contaminant.  BAL 63% histiocytes, 33% lymphocytes, 2% neutrophils, 2% eosinophils.  Flow cytometry favored sarcoidosis. 09/21/2013 spirometry: Performed at [facility], FEV1 2.72 L or 76% predicted, FVC 4.42 L or 95% predicted, FEV1/FVC 61%, DLCO moderately reduced.  Consistent with mild obstructive defect (not congruent with sarcoidosis). 09/22/2018 2D echo: Performed in [facility] [place], LVEF 55 to 60% mild LV dilatation, no wall motion abnormalities, otherwise normal. 08/11/2020 angiotensin-converting enzyme: 61 (reference range 14 to 82 units/L) 08/30/2020 CT chest with contrast: Perihilar traction bronchiectasis, parenchymal retraction and architectural distortion consistent with history of sarcoid.  Left ventricular dilatation.  3 mm left lower lobe nodule. 08/30/2020 echocardiogram: LVEF 45 to 50%, LV with mildly decreased function, global hypokinesis, LV cavity size severely dilated, grade II diastolic dysfunction. 10/24/2020 cardiac morphology MR: Mild to moderate  dilated left ventricle,  normal LV thickness, global hypokinesis, no regional wall motion abnormalities.  Reduced RV and LV systolic function LVEF 36%.  No evidence for infiltrative disease.  Negative for cardiac sarcoidosis. 12/15/2020 CT coronary morphology: Coronary calcium score of 0, no evidence of CAD.  Lung windows findings as previous. 07/06/2021 myocardial PET/[facility]): Abnormal perfusion of the myocardium, normal wall motion and normal thickening.  No coronary artery calcifications, evidence of extracardiac areas in the chest of hypermetabolism suggest sarcoidosis.  Focal increased FDG uptake in the distal esophagus query esophagitis. 08/14/2021 CT chest with contrast: Stable exam from prior, no progressive findings, architectural distortion and scarring bilaterally compatible with prior history of sarcoidosis.  Stable 3 mm left lower lobe pulmonary nodule.  Pulmonary emphysema. 10/05/2021 PFTs: FEV1 2.21 L or 64% predicted, FVC 3.86 L or 87% predicted, FEV1/FVC 57%, low normal lung volumes, diffusion capacity normal.  Consistent with moderate obstruction.   10/19/2021 PET CT myocardial [facility]): Normal myocardial perfusion, normal left ventricular systolic function, wall motion and thickening, dilated left ventricle.  No areas of extracardiac hypermetabolic activity. 03/19/2022 2D echo: LVEF 40 to 45%, left ventricle demonstrates global hypokinesis. LVEF slightly worse than 08/2020 echocardiogram. 06/06/2022 PET/CT myocardial [facility]): 1. There are several segments ( 7 out of 17) with hypermetabolic activity suggestive of an active inflammatory process in the left ventricular myocardium. 2. Approximately 44% of the left ventricle is involved with predominantly mild/moderate/severe hypermetabolic activity. 3. There is evidence of abnormal metabolism in the right ventricle.  4. Overall findings are consistent with relapse of disease (Sarcoidosis).  06/11/2022 QuantiFERON gold: NEGATIVE 09/05/2022 PET/CT myocardial ([facility]):1.  There are several segments with hypermetabolic activity suggestive of an active inflammatory process in the left ventricular myocardium. Previously 7 out of 17 segments and now 9 out of 17 segments. 2. Approximately 58% of the left ventricle is involved with predominantly mild/moderate/severe hypermetabolic activity. 3. There is evidence of mild abnormal metabolism in the right ventricle.  10/11/2022 cardiopulmonary stress test: Spirometry pretest FEV1 2.19 L or 64% predicted, FVC 4.80 L or 94% predicted, FEV1/FVC 54%.  MVV 94 (67%) overall patient gave very good effort.  Pulsoxymeter remained 99 to 100% for the duration of the exercise.  The exercise was performed on a cycle ergometer.  The spirometry does not show significant change from 2023 spirometric values.  The test overall showed low normal functional capacity when compared to much sedentary norms.  Patient appears predominantly pulmonary limited with mixed restrictive/obstructive lung physiology. 05/31/2023 PFTs: FEV1 1.72 L or 50% predicted FVC 3.60 L or 82% predicted FEV1/FVC 48% lung volumes normal with air trapping noted by RV.  Diffusion capacity normal.  Findings consistent with severe obstructive lung disease.  Review of Systems A 10 point review of systems was performed and it is as noted above otherwise negative.   Patient Active Problem List   Diagnosis Date Noted   Adrenal insufficiency (HCC) 05/09/2023   Long term current use of systemic steroids 05/09/2023   High risk medication use 06/11/2022   Generalized osteoarthritis 06/11/2022   Cardiac sarcoidosis 11/06/2021   Stage 2 moderate COPD by GOLD classification (HCC) 11/06/2021   Former smoker 11/06/2021   Shortness of breath 11/06/2021   Right bundle branch block 09/10/2018   GERD (gastroesophageal reflux disease) 06/25/2018   Sarcoidosis of lung (HCC) 06/25/2018   Chronic right shoulder pain 06/25/2018   History of migraine 06/25/2018   History of kidney stones  06/25/2018    Social History   Tobacco Use  Smoking status: Former    Current packs/day: 0.00    Average packs/day: 1 pack/day for 25.0 years (25.0 ttl pk-yrs)    Types: Cigarettes    Start date: 07/24/1983    Quit date: 07/23/2008    Years since quitting: 14.9    Passive exposure: Never   Smokeless tobacco: Former    Types: Chew    Quit date: 07/24/2020  Substance Use Topics   Alcohol use: Not Currently    No Known Allergies  Current Meds  Medication Sig   adalimumab (HUMIRA) 40 MG/0.4ML pen Inject 0.4 mLs (40 mg total) into the skin every 14 (fourteen) days.   empagliflozin (JARDIANCE) 10 MG TABS tablet Take 1 tablet (10 mg total) by mouth daily before breakfast.   metoprolol succinate (TOPROL XL) 25 MG 24 hr tablet Take 0.5 tablets (12.5 mg total) by mouth daily.   predniSONE (DELTASONE) 5 MG tablet Take as directed: 7.5 mg once daily for 4 weeks, then 5 mg daily for 4 weeks, then 2.5 mg daily for 4 weeks (Patient taking differently: Take 5 mg by mouth daily. Take as directed: 7.5 mg once daily for 4 weeks, then 5 mg daily for 4 weeks, then 2.5 mg daily for 4 weeks)   spironolactone (ALDACTONE) 25 MG tablet Take 0.5 tablets by mouth once.   Tiotropium Bromide-Olodaterol (STIOLTO RESPIMAT) 2.5-2.5 MCG/ACT AERS Inhale 2 puffs into the lungs daily.    Immunization History  Administered Date(s) Administered   Influenza,inj,Quad PF,6+ Mos 09/08/2012   Influenza-Unspecified 05/07/2011   Pneumococcal Polysaccharide-23 06/25/2018   Tdap 03/24/2013, 06/25/2018      Objective:    BP 124/82 (BP Location: Right Arm, Cuff Size: Normal)   Pulse 75   Temp 97.7 F (36.5 C)   Ht 5\' 6"  (1.676 m)   Wt 183 lb (83 kg)   SpO2 97%   BMI 29.54 kg/m   SpO2: 97 % O2 Device: None (Room air)  GENERAL: Well-developed, well-nourished gentleman, no acute distress, fully ambulatory, no conversational dyspnea. HEAD: Normocephalic, atraumatic.  EYES: Pupils equal, round, reactive to light.  No  scleral icterus.  MOUTH: Natural dentition, moist.  No thrush. NECK: Supple. No thyromegaly. Trachea midline. No JVD.  No adenopathy. PULMONARY: Good air entry bilaterally. No adventitious sounds.  CARDIOVASCULAR: S1 and S2. Regular rate and rhythm.  No rubs, murmurs or gallops heard. ABDOMEN: Benign. MUSCULOSKELETAL: No joint deformity, no clubbing, no edema.  Tenderness along the right shoulder. NEUROLOGIC: No overt focal deficit, no gait disturbance noted.  Speech is fluent. SKIN: Intact,warm,dry.  On limited exam no rashes. PSYCH: Mood and behavior normal.   Assessment & Plan:     ICD-10-CM   1. Sarcoidosis - Pulmonary + Cardiac  D86.9     2. Stage 2 moderate COPD by GOLD classification (HCC) moderate/severe  J44.9     3. Shortness of breath  R06.02      Discussion:    Chronic Obstructive Pulmonary Disease (COPD) Minimal decline in pulmonary function tests, consistent with COPD. Reports intermittent relief with Stiolto and adverse reactions to albuterol and levoalbuterol (tachycardia). Discussed new medication, Ohtuvayre, an anti-inflammatory for COPD, administered via nebulizer twice daily. Potential benefits include improved COPD management and possible positive effects on sarcoidosis. Risks include cost and need for specialty pharmacy assistance. Informed about the assistance program for cost management. Steve Wang is being incorporated into COPD guidelines due to its efficacy. - Prescribe Ohtuvayre via specialty pharmacy - Continue Stiolto - Follow up in 3 months to assess response  to [facility] - Advise to contact the office if any issues arise before follow-up  Sarcoidosis Currently managed with Humira and tapering off prednisone (currently at 5 mg). Next PET scan scheduled for April 2025 to assess inflammation status post-prednisone taper. Discussed that the PET scan timing is to ensure Humira's efficacy without prednisone influence. - Continue Humira - Continue tapering  prednisone - Has cardiac PET scan scheduled for April 2025 [facility]) by cardiology  General Health Maintenance Declined flu shot due to past experiences of illness post-vaccination. - Document preference to decline flu shot  Follow-up - Schedule follow-up appointment in 3 months.      Gailen Shelter, MD Advanced Bronchoscopy PCCM [facility]    *This note was generated using voice recognition software/Dragon and/or AI transcription program.  Despite best efforts to proofread, errors can occur which can change the meaning. Any transcriptional errors that result from this process are unintentional and may not be fully corrected at the time of dictation.

## 2023-06-13 NOTE — Patient Instructions (Signed)
VISIT SUMMARY:  During today's visit, we reviewed your current health status, focusing on your COPD and sarcoidosis. We discussed your medications and made some adjustments to better manage your conditions. Your overall condition is stable, though there is a slight decline in your pulmonary function. We also talked about your general health maintenance and your preference regarding the flu shot.  YOUR PLAN:  -CHRONIC OBSTRUCTIVE PULMONARY DISEASE (COPD): COPD is a chronic lung disease that makes it hard to breathe. We noted a slight decline in your lung function. You will start a new medication called Ohtuvayre, which is an anti-inflammatory drug administered via nebulizer twice daily. This may help improve your COPD and possibly your sarcoidosis. You should continue using Stiolto and follow up in 3 months to see how you are responding to [facility]. Please contact the office if you experience any issues before your next appointment.  -SARCOIDOSIS: Sarcoidosis is an inflammatory disease that affects multiple organs, particularly the lungs and lymph glands. You are currently managing it with Humira and a tapering dose of prednisone, now at 5 mg. We have scheduled your next PET scan for April 2025 to check the inflammation status after you have tapered off prednisone. Continue with your current medications.  -GENERAL HEALTH MAINTENANCE: You have chosen to decline the flu shot due to past experiences of illness after vaccination. This preference has been documented.  INSTRUCTIONS:  Please schedule a follow-up appointment in 3 months. Additionally, your next cardiac PET scan is scheduled for April 2025 to assess the status of your cardiac sarcoid after tapering off prednisone.

## 2023-06-14 ENCOUNTER — Encounter: Payer: Self-pay | Admitting: Pulmonary Disease

## 2023-06-18 ENCOUNTER — Other Ambulatory Visit: Payer: Self-pay | Admitting: Internal Medicine

## 2023-06-18 DIAGNOSIS — D8685 Sarcoid myocarditis: Secondary | ICD-10-CM

## 2023-06-18 DIAGNOSIS — D860 Sarcoidosis of lung: Secondary | ICD-10-CM

## 2023-06-18 NOTE — Telephone Encounter (Signed)
Attempted to contact the patient to see where he is at on the prednisone taper. Left a message to call the office back.

## 2023-06-19 ENCOUNTER — Telehealth: Payer: Self-pay

## 2023-06-19 NOTE — Telephone Encounter (Signed)
Patient was seen in the office on 06/13/2023. Dr. Jayme Cloud has ordered [facility] for the patient.  He has signed the form and it has been faxed to the pharmacy team for completion.

## 2023-06-23 ENCOUNTER — Other Ambulatory Visit (HOSPITAL_COMMUNITY): Payer: Self-pay | Admitting: Cardiology

## 2023-06-24 NOTE — Telephone Encounter (Signed)
Please call to schedule an appointment for further refills.

## 2023-06-25 NOTE — Telephone Encounter (Signed)
Received [facility] referral form. Faxed to [employer] with clinicals and insurance card copy  Phone: [phone] Fax: [phone]  Chesley Mires, PharmD, MPH, BCPS, CPP Clinical Pharmacist (Rheumatology and Pulmonology)

## 2023-07-03 NOTE — Telephone Encounter (Signed)
Received fax from [facility] that patient's [facility] referral has been received. Rx will be triaged to [facility] specialty pharmacy  Patient ID: [id number]  Chesley Mires, PharmD, MPH, BCPS, CPP Clinical Pharmacist (Rheumatology and Pulmonology)

## 2023-07-08 ENCOUNTER — Other Ambulatory Visit: Payer: Self-pay

## 2023-07-08 DIAGNOSIS — D860 Sarcoidosis of lung: Secondary | ICD-10-CM

## 2023-07-08 DIAGNOSIS — D8685 Sarcoid myocarditis: Secondary | ICD-10-CM

## 2023-07-08 MED ORDER — ADALIMUMAB 40 MG/0.4ML ~~LOC~~ AJKT
40.0000 mg | AUTO-INJECTOR | SUBCUTANEOUS | 0 refills | Status: DC
Start: 2023-07-08 — End: 2023-07-30

## 2023-07-08 NOTE — Telephone Encounter (Signed)
Refill request received via fax from [facility] for Humira  Last Fill: 04/29/2023  Labs: 05/08/2023 Sedimentation rate of 2 remains completely normal.  Kidney and liver function are normal.  The tuberculosis screening test is negative.  His overall blood counts are fine the lymphocytes are slightly low at 800 this is probably a side effect of his medication.  This is not a large enough decrease to be any major concern unless it were to trend down much more in follow-up.   TB Gold: 05/08/2023 The tuberculosis screening test is negative.  Next Visit: 08/08/2023  Last Visit: 05/08/2023  DX: Cardiac sarcoidosis   Current Dose per office note 05/08/2023: Humira 40 mg subcu q. 14 days.   Okay to refill Humira?

## 2023-07-25 NOTE — Progress Notes (Addendum)
Office Visit Note  Patient: Steve Wang             Date of Birth: Nov 04, 1970           MRN: [id number]             PCP: Malva Limes, MD Referring: Malva Limes, MD Visit Date: 08/08/2023   Subjective:  Follow-up   History of Present Illness: Steve Wang is a 53 y.o. male here for follow up  for sarcoidosis with cardiac and pulmonary involvement currently on Humira 40 mg subcu q. 14 days and tapering prednisone currently at 1.25 mg daily.  He had labs checked this morning for monitoring with disease activity also checking a.m. cortisol level with history of long-term chronic steroid exposure.  No significant respiratory or circulatory symptoms most the time he still gets short of breath with exertion easier than usual and feels generally tired.  No cough or chest pain.  He has not had any known illness though with multiple sick contacts at home.  Has mild pain and stiffness in his hands with stiffness lasting up to 3 hours in the morning.  Not needing any extra medication for this specifically.   Previous HPI 05/08/2023 Steve Wang is a 53 y.o. male here for follow up for sarcoidosis with cardiac and pulmonary involvement currently on Humira 40 mg subcu q. 14 days and tapering prednisone currently 7.5 mg daily.  Since her last visit he had updated PET CT scan with cardiac disease appears quiescent.  He just followed up with his [facility] clinic visit on the ninth agreement with tapering steroid plan.  He still feels a high level of fatigue throughout the day and has dyspnea on exertion. He exercises at home regularly. He was recommended for a brain MRI to rule out neuro involvement due to persistent brain fog/cognitive difficulty. He took a course of doxycycline for a tooth infection but no other serious infections.   Previous HPI 02/05/2023 Steve Wang is a 53 y.o. male here for follow up for sarcoidosis with cardiac and pulmonary involvement currently on Humira 40 mg subcu q. 14 days  and prednisone 10 mg daily.  Since her last visit he thinks there is maybe slight improvement in his dyspnea on exertion but otherwise feels mostly the same.  He did break a toe on his right foot striking on a chair leg accidentally.  He is experiencing some hand stiffness and burning type pain in the morning sometimes lasting until noon.  Not associated with any increased swelling or visible changes.  No new rashes and he has not been sick with anything requiring antibiotics.  He is scheduled for an updated PET CT scan for cardiac involvement tomorrow.   Previous HPI 10/31/22 Steve Wang is a 53 y.o. male here for follow up for sarcoidosis with cardiac and pulmonary involvement on prednisone 15 mg daily and recently started Humira 40 mg subcu q. 14 days with 3 doses taken so far.  He has not noticed any particular side effect or intolerance with taking the medication.  He continues having some shortness of breath with exertion he can make it about 2 flights of stairs before getting winded.  He followed up with Dr. Jayme Cloud about asthma/COPD inhaler treatment and pulmonary rehab. Other symptom has been extensive body aches in the mornings in the past month. This has partially improved since the onset but still has hours of morning stiffness, and pain lasting throughout the day. He thinks decreasing prednisone  from 20 to 15 mg daily may have actually improved this, and saw no benefit with additional ibuprofen.   Previous HPI 06/11/22 Steve Wang is a 53 y.o. male here for management of sarcoidosis. He was originally diagnosed by bronchoscopy and possibly biopsy (not available for review to me at this time) in 2012 with initial treatment course of methotrexate and prednisone for a year but was not on long term immunosuppression and without disease activity. More recently concern due to decreased in EF under close monitoring with heart failure clinic possibly thought reltaed to COVID-19 infection and  inflammation. Cardiac MRI in 2022 unremarkable for inflammatory changes as was PET/CT surveillance in March. However he continued to developed symptomatic disease this improved on methotrexate and prednisone but had return of joint pains with prednisone tapering. Repeat PET/CT obtained 11/15 now showing evidence of increased metabolic uptake in both ventricular wall as mediastinal lymph nodes concerning for active sarcoidosis.  His symptoms are not very noticeable at baseline he can climb about 3 flights of stairs without resting but would be winded by the end of it and recover after several minutes.  He has not having any cough or chest pain.  He has noticed episodic skin rash usually breaking out on his scalp in the center of the chest and seems to correspond with when he has more exertional dyspnea or mild coughing.  He has some joint pain in hands and feet in the morning she was improved after 5 to 10 minutes.  He does not see any visible swelling or erythema.  He has never had any eye inflammation or vision change during this time. Recently this year he was resumed on methotrexate treatment but he did not tolerate this well felt a lot of brain fog and general malaise symptoms.  He never noticed much difference in symptoms with the methotrexate and so far is only seen a difference when prescribed steroids.  He stopped taking the methotrexate since about 5 weeks ago due to the side effects and not seeing a difference.  He is currently on a prolonged prednisone taper based on findings for active cardiac sarcoidosis currently 30 mg daily plan to taper down and maintain on 10 mg dose until February follow-up repeat scan.   Imaging reviewed 06/06/22 PET/CT Cardiac PET/CT metabolic study with F-18 FDG and scanned on PET [facility]: 1. There are several segments ( 7 out of 17) with hypermetabolic activity suggestive of an active inflammatory process in the left ventricular myocardium. 2. Approximately 44% of  the left ventricle is involved with predominantly mild/moderate/severe hypermetabolic activity. 3. There is evidence of abnormal metabolism in the right ventricle. 4. Overall findings are consistent with relapse of disease (Sarcoidosis).  Perfusion/Function.  Gated cardiac PET/CT rest myocardial perfusion study with [id number] demonstrates: 1. Normal myocardial perfusion. 2. Dilated left ventricle with normal left ventricular systolic function.  Non-diagnostic CT obtained for attenuation correction:  1. There are no discernible coronary artery calcifications. 2. There are extensive areas of extracardiac hypermetabolic activity noted on the limited field of view, specifically bilateral hilar, mediastinal lymph nodes, and extensive lung opacities, and retroperitoneal lymphnodes. Those findings were not present on most recent exam.   Review of Systems  Constitutional:  Negative for fatigue.  HENT:  Positive for mouth dryness. Negative for mouth sores.   Eyes:  Negative for dryness.  Respiratory:  Positive for shortness of breath.   Cardiovascular:  Negative for chest pain and palpitations.  Gastrointestinal:  Negative for blood in stool,  constipation and diarrhea.  Endocrine: Negative for increased urination.  Genitourinary:  Negative for involuntary urination.  Musculoskeletal:  Positive for joint pain, joint pain, myalgias, morning stiffness and myalgias. Negative for gait problem, joint swelling, muscle weakness and muscle tenderness.  Skin:  Negative for color change, rash, hair loss and sensitivity to sunlight.  Allergic/Immunologic: Negative for susceptible to infections.  Neurological:  Negative for dizziness and headaches.  Hematological:  Negative for swollen glands.  Psychiatric/Behavioral:  Positive for depressed mood. Negative for sleep disturbance. The patient is nervous/anxious.     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 09/2018 Echo: EF 55-60%; b. 08/2020 Echo: EF 45-50%, grade 2 diastolic dysfunction; c. 10/2020 cMRI: EF 36%, mild to mod dil LV. Mod red RV fxn. No LGE/evidence of sarcoid. No signif valvular dzs   NICM (nonischemic cardiomyopathy) (HCC)    a. 09/2018 Echo: EF 55-60%; b. 08/2020 Echo: EF 45-50%; c. 10/2020 cMRI: EF 36%, mild to mod dil LV. Mod red RV fxn. No LGE/evidence of sarcoid. No signif valvular dzs; d. 12/2020 Cor CTA: Ca2+= 0. Nl cors.   Pulmonary nodule    a. 12/2020 CT chest: 1.3cm posterior basal LUL nodule and 3mm LLL nodule - unchanged.   RBBB    Sarcoidosis    a. Dx 10/2020 in [place] - prev on steroids/methotrexate.    Family History  Problem Relation Age of Onset   COPD Mother    Colon cancer Neg Hx    Pancreatic cancer Neg Hx    Liver cancer Neg Hx    Esophageal cancer Neg Hx    Stomach cancer Neg Hx    Past Surgical History:  Procedure Laterality Date   BRONCHOSCOPY  11/09/2010   [facility] [place]   COLONOSCOPY     UPPER GI ENDOSCOPY     Social History   Social History Narrative   Not on file   Immunization History  Administered Date(s) Administered   Influenza,inj,Quad PF,6+ Mos  09/08/2012   Influenza-Unspecified 05/07/2011   Pneumococcal Polysaccharide-23 06/25/2018   Tdap 03/24/2013, 06/25/2018     Objective: Vital Signs: BP 108/70 (BP Location: Left Arm, Patient Position: Sitting, Cuff Size: Normal)   Pulse 74   Resp 14   Ht 5\' 6"  (1.676 m)   Wt 177 lb (80.3 kg)   BMI 28.57 kg/m    Physical Exam Eyes:     Conjunctiva/sclera: Conjunctivae normal.  Cardiovascular:     Rate and Rhythm: Normal rate and regular rhythm.  Pulmonary:     Effort: Pulmonary effort is normal.     Breath sounds: Normal breath sounds.  Musculoskeletal:  Right lower leg: No edema.     Left lower leg: No edema.  Lymphadenopathy:     Cervical: No cervical adenopathy.  Skin:    General: Skin is warm and dry.     Findings: No rash.  Neurological:     Mental Status: He is alert.  Psychiatric:        Mood and Affect: Mood normal.      Musculoskeletal Exam:  Neck full ROM no tenderness Shoulders full ROM no tenderness or swelling Elbows full ROM no tenderness or swelling Wrists full ROM no tenderness or swelling Fingers full ROM no tenderness or swelling Right hip decreased FADIR range of motion and anterior pain provoked, no focal tenderness to palpation Knees full ROM no tenderness or swelling   Investigation: No additional findings.  Imaging: No results found.  Recent Labs: Lab Results  Component Value Date   WBC 5.8 08/08/2023   HGB 15.6 08/08/2023   PLT 217 08/08/2023   NA 139 08/08/2023   K 4.2 08/08/2023   CL 103 08/08/2023   CO2 23 08/08/2023   GLUCOSE 92 08/08/2023   BUN 20 08/08/2023   CREATININE 1.07 08/08/2023   BILITOT 0.5 08/08/2023   ALKPHOS 52 08/08/2023   AST 19 08/08/2023   ALT 23 08/08/2023   PROT 6.9 08/08/2023   ALBUMIN 4.3 08/08/2023   CALCIUM 9.5 08/08/2023   GFRAA 116 06/25/2018   QFTBGOLDPLUS NEGATIVE 05/08/2023    Speciality Comments: Humira started 09/26/22  Procedures:  No procedures performed Allergies: Patient has  no known allergies.   Assessment / Plan:     Visit Diagnoses: Cardiac sarcoidosis Disease activity appears to be well-controlled now on the Humira and based on negative for disease activity on November PET scan.  Previous lack of response and unable to taper steroids on methotrexate monotherapy.  Currently he is on extremely low-dose of prednisone assuming labs are nonconcerning for secondary adrenal insufficiency we can aim to discontinue this completely. Currently having problems with medication access for his Humira despite TNF inhibitor treatment being well-documented effective treatment for systemic sarcoidosis including cardiac disease and with recommendation from his specialist consultation at ours and other facilities including pulmonology and cardiology. -Plan to continue Humira 40 mg subcu q. 14 days -If labs remain good taper off remaining prednisone  High risk medication use - Humira 40 mg subcu q. 14 days. Appears to be tolerating medication fine and no serious interval infections. -Checking CBC and CMP for medication monitoring on continued use of Humira  Long term (current) use of systemic steroids  -Checking serum ACTH and cortisol level this a.m.  Generalized osteoarthritis Hand pain likely related to osteoarthritis he does have mild Heberden's node changes in the fingers but without any focal tenderness or swelling on exam.  Also appears to have some degenerative arthritis of the right hip with mildly impaired internal rotation movement.   Orders: No orders of the defined types were placed in this encounter.  No orders of the defined types were placed in this encounter.    Follow-Up Instructions: Return in about 3 months (around 11/06/2023) for Sarcoidosis on ADA f/u 3mos.   Fuller Plan, MD  Note - This record has been created using [employer].  Chart creation errors have been sought, but may not always  have been located. Such creation errors do not  reflect on  the standard of medical care.

## 2023-07-26 ENCOUNTER — Telehealth: Payer: Self-pay

## 2023-07-26 DIAGNOSIS — Z7952 Long term (current) use of systemic steroids: Secondary | ICD-10-CM

## 2023-07-26 DIAGNOSIS — D8685 Sarcoid myocarditis: Secondary | ICD-10-CM

## 2023-07-26 DIAGNOSIS — Z79899 Other long term (current) drug therapy: Secondary | ICD-10-CM

## 2023-07-26 DIAGNOSIS — D860 Sarcoidosis of lung: Secondary | ICD-10-CM

## 2023-07-26 DIAGNOSIS — E2740 Unspecified adrenocortical insufficiency: Secondary | ICD-10-CM

## 2023-07-26 NOTE — Telephone Encounter (Signed)
 Patient approved for PAP through early March 2025 because insurance denied coerage. We need to try for PA approval through insurance again  Submitted an URGENT Prior Authorization request to [facility] for HUMIRA  via CoverMyMeds. Will update once we receive a response.  Key: [id number]

## 2023-07-26 NOTE — Telephone Encounter (Signed)
 Submitted a Prior Authorization request to [facility] for [employer] via CoverMyMeds. Will update once we receive a response.  Key: [id number]

## 2023-07-26 NOTE — Telephone Encounter (Signed)
 Patient contacted the office and states he received a patient assistance form from [employer] from his Humira  to fill out and does not know how to get it sent back to [employer]. Patient states the doctor also needs to fill out a portion of it. Advised the patient I would talk to the pharmacy team to see what they suggest. Please advise.

## 2023-07-30 MED ORDER — ADALIMUMAB 40 MG/0.4ML ~~LOC~~ AJKT: 40.0000 mg | AUTO-INJECTOR | SUBCUTANEOUS | 0 refills | Status: DC

## 2023-07-30 NOTE — Addendum Note (Signed)
 Addended by: Damar Petit P on: 07/30/2023 12:45 PM   Modules accepted: Orders

## 2023-07-30 NOTE — Telephone Encounter (Signed)
 Contacted [employer] and spoke to a Pharmacist named [employer]. Ronelle states they never received the prescription even after advising them we received the receipt. Advised Nakisha we can send in another prescription for the patient. Patient's labs will be pended as well.

## 2023-07-30 NOTE — Telephone Encounter (Addendum)
 Patient repeating labs next week on 08/07/22 at [facility] in [facility]). Please release orders accordingly. Thanks!  Patient also called [employer] today and they said they did not receive rx from 07/08/23. Can you please see what's going on with pharmacy? Shows confirmation of receipt  Reviewed re-approval of Humira  in detail with patient. He is concerned about lapse in access. I discussed that we will do our due diligence regarding this but cannot guarentee reapproval as it was significantly time-intensive   Sherry Pennant, PharmD, MPH, BCPS, CPP Clinical Pharmacist (Rheumatology and Pulmonology)

## 2023-08-01 NOTE — Telephone Encounter (Signed)
 Received PA form from [place] office with additional [employer] clinical questions. Completed and faxed  Fax: [phone]

## 2023-08-05 NOTE — Telephone Encounter (Signed)
 Received notification from [facility] regarding a prior authorization for OHTUVAYRE . Authorization has been APPROVED from 07/26/2023 to 07/25/2024. Approval letter sent to scan center.  Authorization # [id number]  Sherry Pennant, PharmD, MPH, BCPS, CPP Clinical Pharmacist (Rheumatology and Pulmonology)

## 2023-08-07 ENCOUNTER — Telehealth: Payer: Self-pay | Admitting: Pharmacist

## 2023-08-07 NOTE — Telephone Encounter (Signed)
 Contacted the patient about getting his labs done. Patient states he plans to go tomorrow morning so that he can still have the cortisol and ACTH  labs done along with his baseline labs. Per Dr. Rodell Citrin, okay if patient wants to do the Cortisol and ACTH  labs. Released lab orders to [facility].   Patient also states he received a denial letter for Humira  yesterday. Please advise.

## 2023-08-07 NOTE — Telephone Encounter (Signed)
 Paitent has appt tomorrow. We'll need appeal forms completed by patient for Dr. Rodell Citrin to submit appeal on his behalf  Geraldene Kleine, PharmD, MPH, BCPS, CPP Clinical Pharmacist (Rheumatology and Pulmonology)

## 2023-08-07 NOTE — Telephone Encounter (Signed)
 Received a fax regarding Prior Authorization from [facility] for HUMIRA . Authorization has been DENIED because patient does not have FDA-approved diagnosis for Humira  use.   Per denial letter, additional clinical information can be faxed to [phone] for reconsideration. This is not appeal. Appeal would be next step if reconsideration is denied.  Will have patient complete appeal forms at [facility] ton 08/08/2023  Patient form for PAP received  Itzael Liptak, PharmD, MPH, BCPS, CPP Clinical Pharmacist (Rheumatology and Pulmonology)

## 2023-08-08 ENCOUNTER — Ambulatory Visit: Payer: BC Managed Care – PPO | Attending: Internal Medicine | Admitting: Internal Medicine

## 2023-08-08 ENCOUNTER — Encounter: Payer: Self-pay | Admitting: Internal Medicine

## 2023-08-08 VITALS — BP 108/70 | HR 74 | Resp 14 | Ht 66.0 in | Wt 177.0 lb

## 2023-08-08 DIAGNOSIS — Z7952 Long term (current) use of systemic steroids: Secondary | ICD-10-CM | POA: Diagnosis not present

## 2023-08-08 DIAGNOSIS — M159 Polyosteoarthritis, unspecified: Secondary | ICD-10-CM | POA: Diagnosis not present

## 2023-08-08 DIAGNOSIS — Z79899 Other long term (current) drug therapy: Secondary | ICD-10-CM | POA: Diagnosis not present

## 2023-08-08 DIAGNOSIS — D8685 Sarcoid myocarditis: Secondary | ICD-10-CM | POA: Diagnosis not present

## 2023-08-08 NOTE — Telephone Encounter (Signed)
Appeal forms completed at [facility] today by patient. Provided copy of appeal packet to Dr Dimple Casey for possible peer-to-peer with clinical RPH.

## 2023-08-10 LAB — CMP14+EGFR
ALT: 23 [IU]/L (ref 0–44)
AST: 19 IU/L (ref 0–40)
Albumin: 4.3 g/dL (ref 3.8–4.9)
Alkaline Phosphatase: 52 IU/L (ref 44–121)
BUN/Creatinine Ratio: 19 (ref 9–20)
BUN: 20 mg/dL (ref 6–24)
Bilirubin Total: 0.5 mg/dL (ref 0.0–1.2)
CO2: 23 mmol/L (ref 20–29)
Calcium: 9.5 mg/dL (ref 8.7–10.2)
Chloride: 103 mmol/L (ref 96–106)
Creatinine, Ser: 1.07 mg/dL (ref 0.76–1.27)
Globulin, Total: 2.6 g/dL (ref 1.5–4.5)
Glucose: 92 mg/dL (ref 70–99)
Potassium: 4.2 mmol/L (ref 3.5–5.2)
Sodium: 139 mmol/L (ref 134–144)
Total Protein: 6.9 g/dL (ref 6.0–8.5)
eGFR: 84 mL/min/{1.73_m2} (ref >59)

## 2023-08-10 LAB — CBC WITH DIFFERENTIAL/PLATELET
Basophils Absolute: 0.1 10*3/uL (ref 0.0–0.2)
Basos: 1 %
EOS (ABSOLUTE): 0.7 10*3/uL — ABNORMAL HIGH (ref 0.0–0.4)
Eos: 11 %
Hematocrit: 46.5 % (ref 37.5–51.0)
Hemoglobin: 15.6 g/dL (ref 13.0–17.7)
Immature Grans (Abs): 0 10*3/uL (ref 0.0–0.1)
Immature Granulocytes: 0 %
Lymphocytes Absolute: 1.5 10*3/uL (ref 0.7–3.1)
Lymphs: 26 %
MCH: 30.9 pg (ref 26.6–33.0)
MCHC: 33.5 g/dL (ref 31.5–35.7)
MCV: 92 fL (ref 79–97)
Monocytes Absolute: 0.7 10*3/uL (ref 0.1–0.9)
Monocytes: 11 %
Neutrophils Absolute: 2.9 10*3/uL (ref 1.4–7.0)
Neutrophils: 51 %
Platelets: 217 10*3/uL (ref 150–450)
RBC: 5.05 x10E6/uL (ref 4.14–5.80)
RDW: 12.3 % (ref 11.6–15.4)
WBC: 5.8 10*3/uL (ref 3.4–10.8)

## 2023-08-10 LAB — ACTH: ACTH: 16.4 pg/mL (ref 7.2–63.3)

## 2023-08-10 LAB — SEDIMENTATION RATE: Sed Rate: 11 mm/h (ref 0–30)

## 2023-08-10 LAB — CORTISOL: Cortisol: 11.3 ug/dL (ref 6.2–19.4)

## 2023-08-14 NOTE — Telephone Encounter (Signed)
Dr. Dimple Casey scheduled P2P @ 1:30pm today

## 2023-08-19 NOTE — Telephone Encounter (Signed)
P2P for Humira did not overturn Humira denial  Submitted an URGENT appeal to [facility] for HUMIRA with ERS guideline.  Reference # [id number] Phone: [phone] Fax: [phone]  Chesley Mires, PharmD, MPH, BCPS, CPP Clinical Pharmacist (Rheumatology and Pulmonology)

## 2023-08-20 NOTE — Telephone Encounter (Signed)
Patient reached out and stated he received text from [place] stating MD form and insurance card copy are missing. He requested this be faxed. I advised that if we send this today they are still likely to hold his application in limbo since they needed appeal determination last time  He states he does not like being kept in the dark and is anxious to stay on treatment. I have faxed patient form, provider form to [place] today with appeal packet and insurance card copy per his request. Patient advised that P2P was denied and that we have submitted URGENT appeal to [employer] Phone: [phone] Denyse Amass Fax: [phone]  Chesley Mires, PharmD, MPH, BCPS, CPP Clinical Pharmacist (Rheumatology and Pulmonology)

## 2023-08-23 NOTE — Telephone Encounter (Signed)
Received a fax from  [employer] regarding an approval for HUMIRA patient assistance from 08/23/2023 to 08/22/2024. Approval letter sent to scan center.  Phone: [phone] Fax: [phone]  Patient notified via MyChart  Chesley Mires, PharmD, MPH, BCPS, CPP Clinical Pharmacist (Rheumatology and Pulmonology)

## 2023-08-24 IMAGING — DX DG CHEST 2V
2 series · 2 of 2 positions shown · non-contrast
Comparison: Chest x-ray 06/27/2018.

CLINICAL DATA: 51-year-old male with history of shortness of
breath. Sarcoidosis.

EXAM:
CHEST - 2 VIEW

[chest pa]
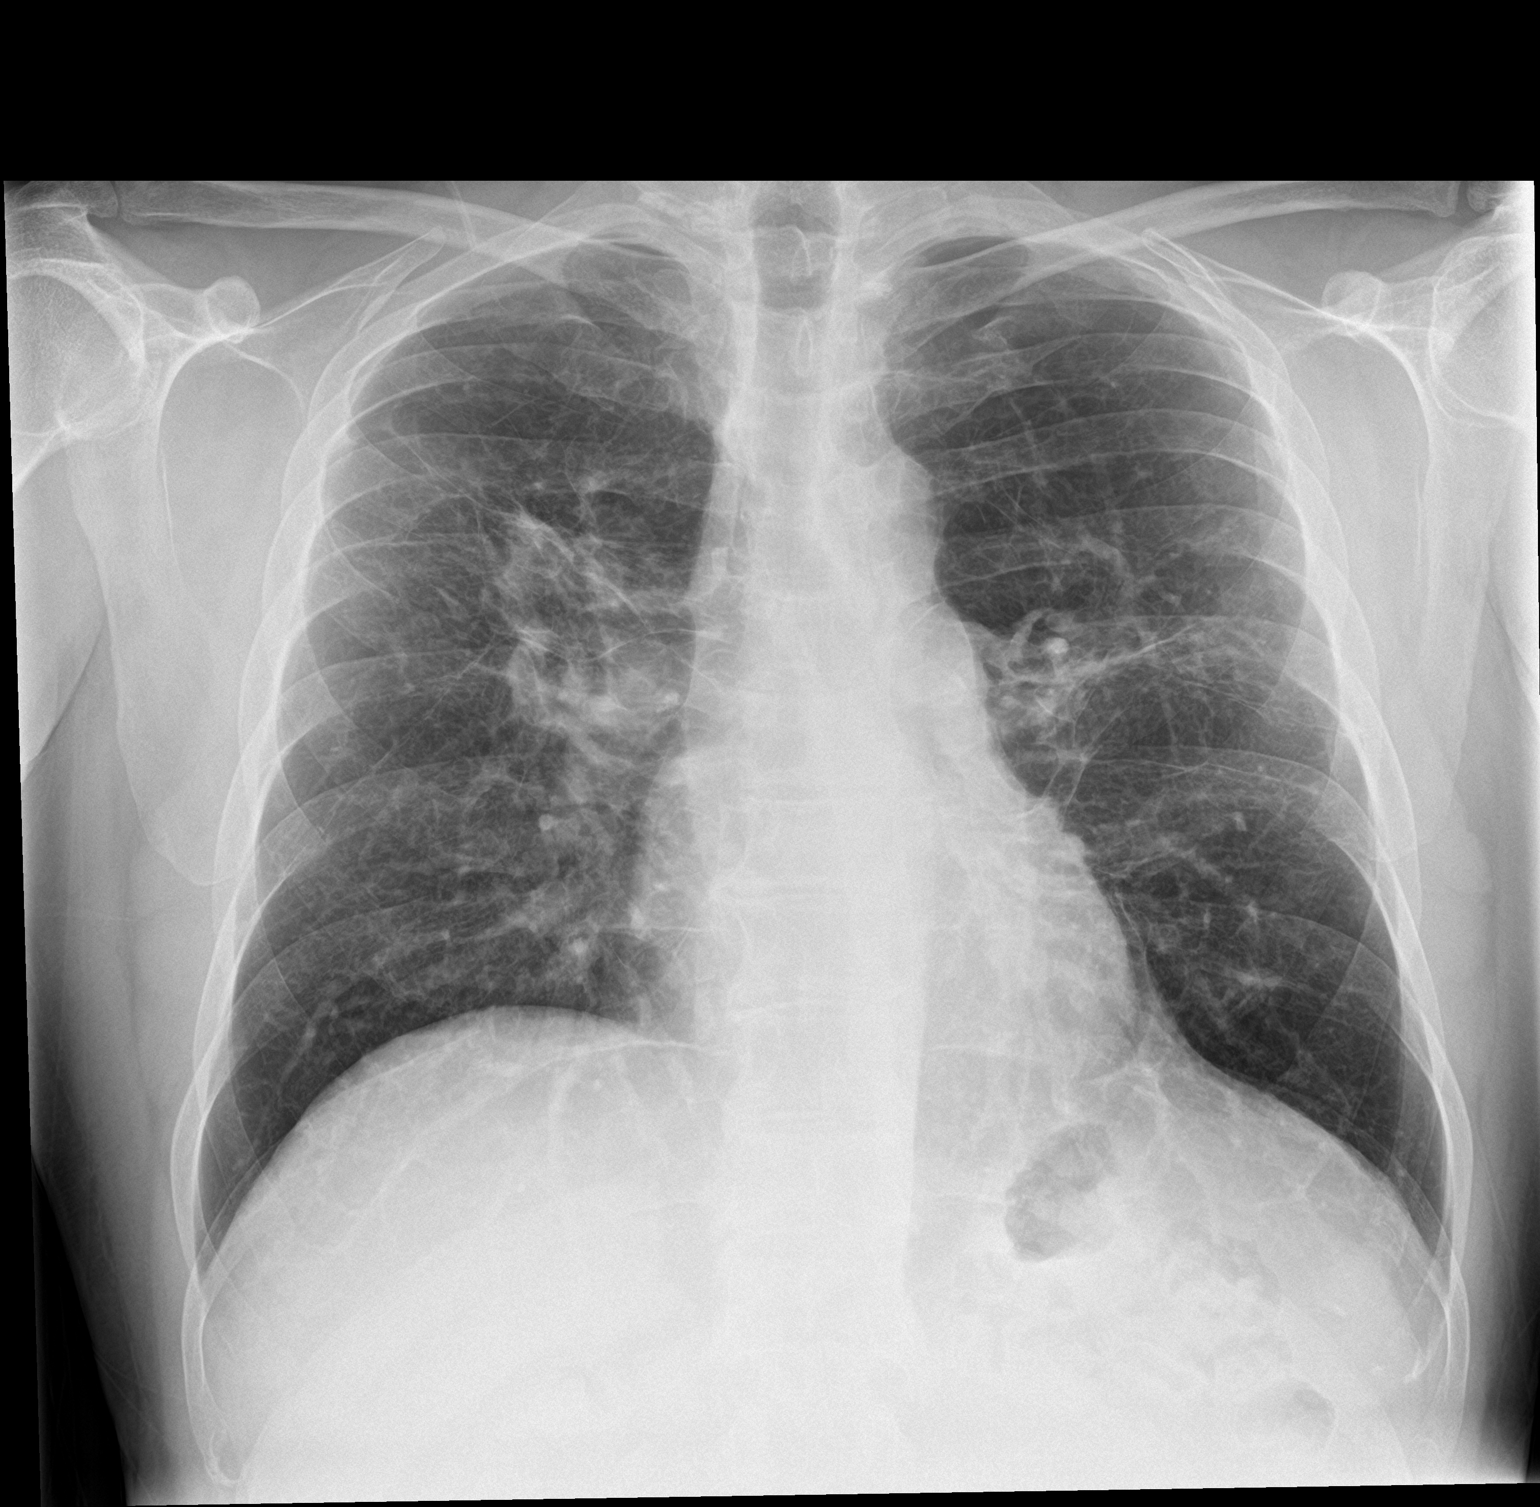

[chest lat]
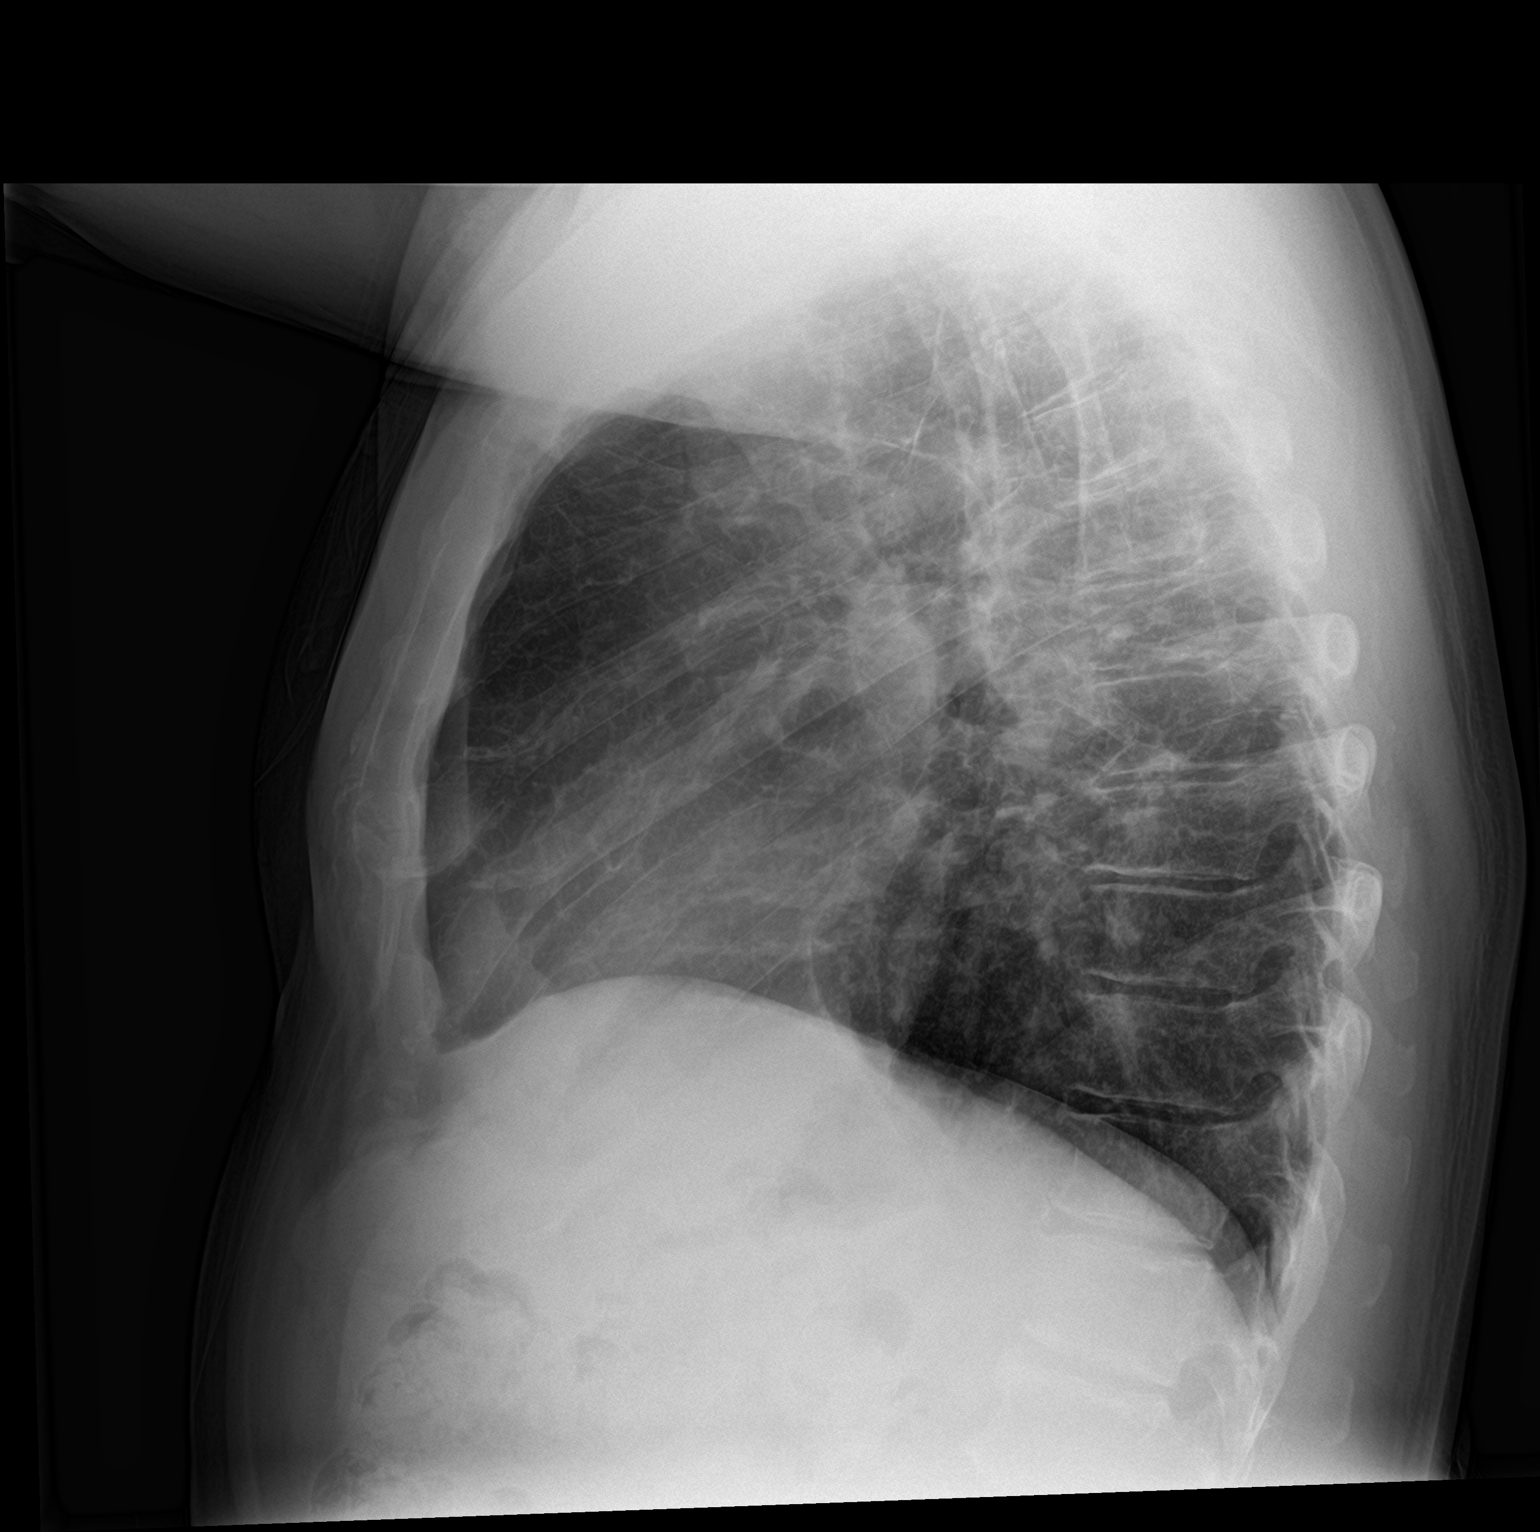

[2 of 2 positions shown; findings below may reference images not displayed]

FINDINGS: Lung volumes are normal. No consolidative airspace disease. No
pleural effusions. No pneumothorax. No evidence of pulmonary edema.
Widespread areas of interstitial prominence and reticulonodular
opacities are noted in the lungs bilaterally, most evident in the
perihilar regions where there is extensive nodular and mass-like
architectural distortion, along with upward retraction of hilar
structures, which is chronic and similar to prior studies. Heart
size is normal. Atherosclerotic calcifications in the thoracic
aorta.
IMPRESSION: 1. Chronic imaging stigmata compatible with reported clinical
history of sarcoidosis, as above, without radiographic evidence of
acute cardiopulmonary disease.
2. Aortic atherosclerosis.

## 2023-09-02 ENCOUNTER — Ambulatory Visit: Payer: BC Managed Care – PPO | Admitting: Dermatology

## 2023-09-02 DIAGNOSIS — Z79899 Other long term (current) drug therapy: Secondary | ICD-10-CM

## 2023-09-02 DIAGNOSIS — D492 Neoplasm of unspecified behavior of bone, soft tissue, and skin: Secondary | ICD-10-CM

## 2023-09-02 DIAGNOSIS — W908XXA Exposure to other nonionizing radiation, initial encounter: Secondary | ICD-10-CM

## 2023-09-02 DIAGNOSIS — L578 Other skin changes due to chronic exposure to nonionizing radiation: Secondary | ICD-10-CM

## 2023-09-02 DIAGNOSIS — L570 Actinic keratosis: Secondary | ICD-10-CM

## 2023-09-02 DIAGNOSIS — Z1283 Encounter for screening for malignant neoplasm of skin: Secondary | ICD-10-CM

## 2023-09-02 DIAGNOSIS — L814 Other melanin hyperpigmentation: Secondary | ICD-10-CM

## 2023-09-02 DIAGNOSIS — D225 Melanocytic nevi of trunk: Secondary | ICD-10-CM | POA: Diagnosis not present

## 2023-09-02 DIAGNOSIS — L821 Other seborrheic keratosis: Secondary | ICD-10-CM

## 2023-09-02 DIAGNOSIS — D1801 Hemangioma of skin and subcutaneous tissue: Secondary | ICD-10-CM

## 2023-09-02 DIAGNOSIS — D229 Melanocytic nevi, unspecified: Secondary | ICD-10-CM

## 2023-09-02 NOTE — Progress Notes (Signed)
 New Patient Visit   Subjective  Steve Wang is a 53 y.o. male who presents for the following: Skin Cancer Screening and Full Body Skin Exam. Pt does have spots on back, moles on back he would like removed, and some spots on face he would like looked at. No personal hx of skin cancers, no family hx of skin cancer.  Pt referred by his Rheumatologist for yearly skin checks, on Humira  TNF inhibitor; for sarcoidosis.    The patient presents for Total-Body Skin Exam (TBSE) for skin cancer screening and mole check. The patient has spots, moles and lesions to be evaluated, some may be new or changing and the patient may have concern these could be cancer.    The following portions of the chart were reviewed this encounter and updated as appropriate: medications, allergies, medical history  Review of Systems:  No other skin or systemic complaints except as noted in HPI or Assessment and Plan.  Objective  Well appearing patient in no apparent distress; mood and affect are within normal limits.  A full examination was performed including scalp, head, eyes, ears, nose, lips, neck, chest, axillae, abdomen, back, buttocks, bilateral upper extremities, bilateral lower extremities, hands, feet, fingers, toes, fingernails, and toenails. All findings within normal limits unless otherwise noted below.   Relevant physical exam findings are noted in the Assessment and Plan.  L malar cheek x1 Pink scaly macules Right Lower Back 6mm tan papule  Right Upper Back 4mm fleshy brown papule  Left Upper Back 3mm fleshy brown papule   Assessment & Plan   SKIN CANCER SCREENING PERFORMED TODAY.  Patient in on Humira  for Sarcoidosis  ACTINIC DAMAGE - Chronic condition, secondary to cumulative UV/sun exposure - diffuse scaly erythematous macules with underlying dyspigmentation - Recommend daily broad spectrum sunscreen SPF 30+ to sun-exposed areas, reapply every 2 hours as needed.  - Staying in the shade  or wearing long sleeves, sun glasses (UVA+UVB protection) and wide brim hats (4-inch brim around the entire circumference of the hat) are also recommended for sun protection.  - Call for new or changing lesions.  LENTIGINES, SEBORRHEIC KERATOSES, HEMANGIOMAS - Benign normal skin lesions - Benign-appearing - Call for any changes  MELANOCYTIC NEVI - Tan-brown and/or pink-flesh-colored symmetric macules and papules - Benign appearing on exam today - Observation - Call clinic for new or changing moles - Recommend daily use of broad spectrum spf 30+ sunscreen to sun-exposed areas.   AK (ACTINIC KERATOSIS) L malar cheek x1 Actinic keratoses are precancerous spots that appear secondary to cumulative UV radiation exposure/sun exposure over time. They are chronic with expected duration over 1 year. A portion of actinic keratoses will progress to squamous cell carcinoma of the skin. It is not possible to reliably predict which spots will progress to skin cancer and so treatment is recommended to prevent development of skin cancer.  Recommend daily broad spectrum sunscreen SPF 30+ to sun-exposed areas, reapply every 2 hours as needed.  Recommend staying in the shade or wearing long sleeves, sun glasses (UVA+UVB protection) and wide brim hats (4-inch brim around the entire circumference of the hat). Call for new or changing lesions. Destruction of lesion - L malar cheek x1  Destruction method: cryotherapy   Informed consent: discussed and consent obtained   Lesion destroyed using liquid nitrogen: Yes   Region frozen until ice ball extended beyond lesion: Yes   Outcome: patient tolerated procedure well with no complications   Post-procedure details: wound care instructions given  Additional details:  Prior to procedure, discussed risks of blister formation, small wound, skin dyspigmentation, or rare scar following cryotherapy. Recommend Vaseline ointment to treated areas while healing.  NEOPLASM OF  SKIN (3) Right Lower Back Epidermal / dermal shaving  Lesion diameter (cm):  0.6 Informed consent: discussed and consent obtained   Patient was prepped and draped in usual sterile fashion: area prepped with alcohol. Anesthesia: the lesion was anesthetized in a standard fashion   Anesthetic:  1% lidocaine w/ epinephrine 1-100,000 buffered w/ 8.4% NaHCO3 Instrument used: flexible razor blade   Hemostasis achieved with: pressure, aluminum chloride and electrodesiccation   Outcome: patient tolerated procedure well   Post-procedure details: wound care instructions given   Post-procedure details comment:  Ointment and small bandage applied Specimen 1 - Surgical pathology Differential Diagnosis: Irritated Nevi vs Other  Check Margins: No 6mm tan papule Right Upper Back Epidermal / dermal shaving  Lesion diameter (cm):  0.4 Informed consent: discussed and consent obtained   Patient was prepped and draped in usual sterile fashion: area prepped with alcohol. Anesthesia: the lesion was anesthetized in a standard fashion   Anesthetic:  1% lidocaine w/ epinephrine 1-100,000 buffered w/ 8.4% NaHCO3 Instrument used: flexible razor blade   Hemostasis achieved with: pressure, aluminum chloride and electrodesiccation   Outcome: patient tolerated procedure well   Post-procedure details: wound care instructions given   Post-procedure details comment:  Ointment and small bandage applied Specimen 2 - Surgical pathology Differential Diagnosis: Irritated Nevi vs Other  Check Margins: No 4mm fleshy brown papule Left Upper Back Epidermal / dermal shaving  Lesion diameter (cm):  0.3 Informed consent: discussed and consent obtained   Patient was prepped and draped in usual sterile fashion: area prepped with alcohol. Anesthesia: the lesion was anesthetized in a standard fashion   Anesthetic:  1% lidocaine w/ epinephrine 1-100,000 buffered w/ 8.4% NaHCO3 Instrument used: flexible razor blade    Hemostasis achieved with: pressure, aluminum chloride and electrodesiccation   Outcome: patient tolerated procedure well   Post-procedure details: wound care instructions given   Post-procedure details comment:  Ointment and small bandage applied Specimen 3 - Surgical pathology Differential Diagnosis: Irritated Nevi vs Other  Check Margins: No 3mm fleshy brown papule  Return in about 1 year (around 09/01/2024) for TBSE.  I, Jacquelynn Vera, CMA, am acting as scribe for Artemio Larry, MD .   Documentation: I have reviewed the above documentation for accuracy and completeness, and I agree with the above.  Artemio Larry, MD

## 2023-09-02 NOTE — Patient Instructions (Addendum)
 Cryotherapy Aftercare  Wash gently with soap and water everyday.   Apply Vaseline and Band-Aid daily until healed.    Wound Care Instructions  Cleanse wound gently with soap and water once a day then pat dry with clean gauze. Apply a thin coat of Petrolatum (petroleum jelly, "Vaseline") over the wound (unless you have an allergy to this). We recommend that you use a new, sterile tube of Vaseline. Do not pick or remove scabs. Do not remove the yellow or white "healing tissue" from the base of the wound.  Cover the wound with fresh, clean, nonstick gauze and secure with paper tape. You may use Band-Aids in place of gauze and tape if the wound is small enough, but would recommend trimming much of the tape off as there is often too much. Sometimes Band-Aids can irritate the skin.  You should call the office for your biopsy report after 1 week if you have not already been contacted.  If you experience any problems, such as abnormal amounts of bleeding, swelling, significant bruising, significant pain, or evidence of infection, please call the office immediately.  FOR ADULT SURGERY PATIENTS: If you need something for pain relief you may take 1 extra strength Tylenol (acetaminophen) AND 2 Ibuprofen (200mg  each) together every 4 hours as needed for pain. (do not take these if you are allergic to them or if you have a reason you should not take them.) Typically, you may only need pain medication for 1 to 3 days.    Melanoma ABCDEs  Melanoma is the most dangerous type of skin cancer, and is the leading cause of death from skin disease.  You are more likely to develop melanoma if you: Have light-colored skin, light-colored eyes, or red or blond hair Spend a lot of time in the sun Tan regularly, either outdoors or in a tanning bed Have had blistering sunburns, especially during childhood Have a close family member who has had a melanoma Have atypical moles or large birthmarks  Early detection of  melanoma is key since treatment is typically straightforward and cure rates are extremely high if we catch it early.   The first sign of melanoma is often a change in a mole or a new dark spot.  The ABCDE system is a way of remembering the signs of melanoma.  A for asymmetry:  The two halves do not match. B for border:  The edges of the growth are irregular. C for color:  A mixture of colors are present instead of an even brown color. D for diameter:  Melanomas are usually (but not always) greater than 6mm - the size of a pencil eraser. E for evolution:  The spot keeps changing in size, shape, and color.  Please check your skin once per month between visits. You can use a small mirror in front and a large mirror behind you to keep an eye on the back side or your body.   If you see any new or changing lesions before your next follow-up, please call to schedule a visit.  Please continue daily skin protection including broad spectrum sunscreen SPF 30+ to sun-exposed areas, reapplying every 2 hours as needed when you're outdoors.   Staying in the shade or wearing long sleeves, sun glasses (UVA+UVB protection) and wide brim hats (4-inch brim around the entire circumference of the hat) are also recommended for sun protection.    Due to recent changes in healthcare laws, you may see results of your pathology and/or laboratory studies  on MyChart before the doctors have had a chance to review them. We understand that in some cases there may be results that are confusing or concerning to you. Please understand that not all results are received at the same time and often the doctors may need to interpret multiple results in order to provide you with the best plan of care or course of treatment. Therefore, we ask that you please give [place] 2 business days to thoroughly review all your results before contacting the office for clarification. Should we see a critical lab result, you will be contacted sooner.   If  You Need Anything After Your Visit  If you have any questions or concerns for your doctor, please call our main line at [phone] and press option 4 to reach your doctor's medical assistant. If no one answers, please leave a voicemail as directed and we will return your call as soon as possible. Messages left after 4 pm will be answered the following business day.   You may also send [place] a message via MyChart. We typically respond to MyChart messages within 1-2 business days.  For prescription refills, please ask your pharmacy to contact our office. Our fax number is [phone].  If you have an urgent issue when the clinic is closed that cannot wait until the next business day, you can page your doctor at the number below.    Please note that while we do our best to be available for urgent issues outside of office hours, we are not available 24/7.   If you have an urgent issue and are unable to reach [place], you may choose to seek medical care at your doctor's office, retail clinic, urgent care center, or emergency room.  If you have a medical emergency, please immediately call 911 or go to the emergency department.  Pager Numbers  - Dr. Gwen Pounds: [phone]  - Dr. Roseanne Reno: [phone]  - Dr. Katrinka Blazing: [phone]   In the event of inclement weather, please call our main line at [phone] for an update on the status of any delays or closures.  Dermatology Medication Tips: Please keep the boxes that topical medications come in in order to help keep track of the instructions about where and how to use these. Pharmacies typically print the medication instructions only on the boxes and not directly on the medication tubes.   If your medication is too expensive, please contact our office at [phone] option 4 or send [place] a message through MyChart.   We are unable to tell what your co-pay for medications will be in advance as this is different depending on your insurance coverage.  However, we may be able to find a substitute medication at lower cost or fill out paperwork to get insurance to cover a needed medication.   If a prior authorization is required to get your medication covered by your insurance company, please allow [place] 1-2 business days to complete this process.  Drug prices often vary depending on where the prescription is filled and some pharmacies may offer cheaper prices.  The website www.goodrx.com contains coupons for medications through different pharmacies. The prices here do not account for what the cost may be with help from insurance (it may be cheaper with your insurance), but the website can give you the price if you did not use any insurance.  - You can print the associated coupon and take it with your prescription to the pharmacy.  - You may also stop by our office during  regular business hours and pick up a GoodRx coupon card.  - If you need your prescription sent electronically to a different pharmacy, notify our office through [facility] or by phone at [phone] option 4.     Si Usted Necesita Algo Despus de Su Visita  Tambin puede enviarnos un mensaje a travs de Clinical cytogeneticist. Por lo general respondemos a los mensajes de MyChart en el transcurso de 1 a 2 das hbiles.  Para renovar recetas, por favor pida a su farmacia que se ponga en contacto con nuestra oficina. Annie Sable de fax es [place] [phone].  Si tiene un asunto urgente cuando la clnica est cerrada y que no puede esperar hasta el siguiente da hbil, puede llamar/localizar a su doctor(a) al nmero que aparece a continuacin.   Por favor, tenga en cuenta que aunque hacemos todo lo posible para estar disponibles para asuntos urgentes fuera del horario de [place], no estamos disponibles las 24 horas del da, los 7 [address]  [address] de la [place].   Si tiene un problema urgente y no puede comunicarse con nosotros, puede optar por buscar atencin mdica  en el consultorio de su  doctor(a), en una clnica privada, en un centro de atencin urgente o en una sala de emergencias.  Si tiene Engineer, drilling, por favor llame inmediatamente al 911 o vaya a la sala de emergencias.  Nmeros de bper  - Dr. Gwen Pounds: [phone]  - Dra. Roseanne Reno: [phone]  - Dr. Katrinka Blazing: [phone]   En caso de inclemencias del tiempo, por favor llame a Lacy Duverney principal al [phone] para una actualizacin sobre el [place] de cualquier retraso o cierre.  Consejos para la medicacin en dermatologa: Por favor, guarde las cajas en las que vienen los medicamentos de uso tpico para ayudarle a seguir las instrucciones sobre dnde y cmo usarlos. Las farmacias generalmente imprimen las instrucciones del medicamento slo en las cajas y no directamente en los tubos del [place].   Si su medicamento es muy caro, por favor, pngase en contacto con Rolm Gala llamando al [phone] y presione la opcin 4 o envenos un mensaje a travs de Clinical cytogeneticist.   No podemos decirle cul ser su copago por los medicamentos por adelantado ya que esto es diferente dependiendo de la cobertura de su seguro. Sin embargo, es posible que podamos encontrar un medicamento sustituto a Audiological scientist un formulario para que el seguro cubra el medicamento que se considera necesario.   Si se requiere una autorizacin previa para que su compaa de seguros [place] su medicamento, por favor permtanos de 1 a 2 das hbiles para completar [address]  Los precios de los medicamentos varan con frecuencia dependiendo del Environmental consultant de dnde se surte la receta y alguna farmacias pueden ofrecer precios ms baratos.  El sitio web www.goodrx.com tiene cupones para medicamentos de Health and safety inspector. Los precios aqu no tienen en cuenta lo que podra costar con la ayuda del seguro (puede ser ms barato con su seguro), pero el sitio web puede darle el precio si no utiliz Tourist information centre manager.  - Puede imprimir el  cupn correspondiente y llevarlo con su receta a la farmacia.  - Tambin puede pasar por nuestra oficina durante el horario de atencin regular y Education officer, museum una tarjeta de cupones de GoodRx.  - Si necesita que su receta se enve electrnicamente a una farmacia diferente, informe a nuestra oficina a travs de MyChart de [place] o por telfono llamando al [phone] y presione la opcin 4.

## 2023-09-04 LAB — SURGICAL PATHOLOGY

## 2023-09-05 ENCOUNTER — Telehealth: Payer: Self-pay

## 2023-09-05 NOTE — Telephone Encounter (Signed)
Advised pt of bx results/sh ?

## 2023-09-05 NOTE — Telephone Encounter (Signed)
-----   Message from Willeen Niece sent at 09/05/2023  8:21 AM EST ----- 1. Skin, right lower back :       MELANOCYTIC NEVUS, COMPOUND TYPE, IRRITATED   2. Skin, right upper back :       MELANOCYTIC NEVUS, INTRADERMAL TYPE, IRRITATED  3. Skin, left upper back :       MELANOCYTIC NEVUS WITH HYPERPIGMENTATION, IRRITATED   All three are benign irritated moles - please call patient

## 2023-09-13 ENCOUNTER — Encounter: Payer: Self-pay | Admitting: Pulmonary Disease

## 2023-09-13 ENCOUNTER — Ambulatory Visit: Payer: BC Managed Care – PPO | Admitting: Pulmonary Disease

## 2023-09-13 VITALS — BP 110/62 | HR 70 | Temp 96.9°F | Ht 66.0 in | Wt 178.0 lb

## 2023-09-13 DIAGNOSIS — D869 Sarcoidosis, unspecified: Secondary | ICD-10-CM | POA: Diagnosis not present

## 2023-09-13 DIAGNOSIS — J449 Chronic obstructive pulmonary disease, unspecified: Secondary | ICD-10-CM

## 2023-09-13 NOTE — Patient Instructions (Addendum)
VISIT SUMMARY:  Steve Wang, a 53 year old male with sarcoidosis involving his lungs and heart, as well as chronic obstructive pulmonary disease (COPD), came in for a follow-up visit. He reported stable breathing but has experienced increased muscle and body aches after discontinuing prednisone. He is currently on Humira, Jardiance, metoprolol, and spironolactone. There are issues with insurance coverage for a nebulizer needed for his COPD. He is due for a PET scan in April and pulmonary function test will be due in November.  YOUR PLAN:  -SARCOIDOSIS WITH PULMONARY AND CARDIAC INVOLVEMENT: Sarcoidosis is an inflammatory disease that affects multiple organs, particularly the lungs and heart in your case. You reported no change in breathing but have muscle and body aches after stopping prednisone. We will continue with Humira and consider weaning off Jardiance, metoprolol, and spironolactone in the future. Pulmonary function tests will be due in November.  -CHRONIC OBSTRUCTIVE PULMONARY DISEASE (COPD): COPD is a chronic lung condition that makes it hard to breathe. You need a nebulizer for treatment, but insurance has denied coverage. We discussed alternative options for obtaining a nebulizer and necessary cup, including purchasing from justnebulizers.com or [employer].  I request will be sent to one of our DME companies for a Parineb Trek which is what you need for using Ohtuvayre.  Continue using Stiolto 2 puffs once daily.  Continue as needed albuterol.  -GENERAL HEALTH MAINTENANCE: We discussed the potential for weaning off some of your medications. For now, continue taking Humira, Jardiance, metoprolol, and spironolactone as prescribed. We will monitor your condition and adjust medications as needed.  INSTRUCTIONS:  -You have a scheduled PET scan in April by cardiology. - Follow-up appointment in three months. -You will be due for pulmonary function test in November.   For reference this is what  you need for nebulizer:

## 2023-09-13 NOTE — Progress Notes (Signed)
Subjective:    Patient ID: Steve Wang, male    DOB: 14-May-1971, 53 y.o.   MRN: [id number]  Patient Care Team: Malva Limes, MD as PCP - General (Family Medicine) Antonieta Iba, MD as PCP - Cardiology (Cardiology) Salena Saner, MD as Consulting Physician (Pulmonary Disease)  Chief Complaint  Patient presents with   Follow-up    DOE. Occasional wheezing. No cough.     BACKGROUND/INTERVAL: Steve Wang is a 53 year old very complex former smoker with 25-pack-year history of smoking and a history of sarcoidosis first diagnosed in 2012 at the [facility] [place] by lung biopsy. 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  He has been started by [facility] rheumatology in March 2024 on Humira for management of his cardiac sarcoid as he has continued to show activity on methotrexate and prednisone.  He follows with cardiac PET/CTs at [facility].  He also follows with cardiology through [facility]. The patient was last seen here on 13 June 2023.  Since that time he has weaned completely off of prednisone.  He also has a COPD component and is on Stiolto for the same.  Prescribed Ohtuvayre at his last appointment.  HPI Discussed the use of AI scribe software for clinical note transcription with the patient, who gave verbal consent to proceed.  History of Present Illness   Steve Wang is a 53 year old male with sarcoidosis with pulmonary and cardiac involvement and chronic obstructive pulmonary disease who presents for follow-up.  He has been experiencing stable breathing without significant changes and is not using oxygen at night. He discontinued prednisone over a month ago, resulting in increased muscle and body aches, particularly in his hands and fingertips.  Prednisone was for his cardiac sarcoid.  He is currently on Humira, which has been approved for another year, along with Jardiance, metoprolol, and spironolactone.  He is uncertain about the necessity of continuing these medications, except for Humira.  These medications however are managed by cardiology and he has upcoming appointment with them.  Advised to discuss this with cardiology at that time.  There are issues with insurance denying coverage for a nebulizer and aerosol delivery system needed for COPD medication (Ohtuvayre).  He is due for a PET scan in April, and his last pulmonary function tests were conducted in November 2024.  DATA: 10/13/2010 CT chest: Report only, performed at [facility] [place]: Upper lung predominant parenchymal changes, scarring and traction bronchiectasis with small peribronchial and perifissural nodules.  Enlarged bilateral hilar and mediastinal lymph nodes, findings all suggestive of sarcoidosis. 11/03/2010: ACE level 98, eosinophils elevated 8.1% 11/09/2010 Bronchoscopy specimens: [facility] [place], noncaseating granulomas right upper lobe, fungal culture negative for nocardia, Doratomyces species isolated, contaminant.  BAL 63% histiocytes, 33% lymphocytes, 2% neutrophils, 2% eosinophils.  Flow cytometry favored sarcoidosis. 09/21/2013 spirometry: Performed at [facility], FEV1 2.72 L or 76% predicted, FVC 4.42 L or 95% predicted, FEV1/FVC 61%, DLCO moderately reduced.  Consistent with mild obstructive defect (not congruent with sarcoidosis). 09/22/2018 2D echo: Performed in [facility], [place], LVEF 55 to 60% mild LV dilatation, no wall motion abnormalities, otherwise normal. 08/11/2020 angiotensin-converting enzyme: 61 (reference range 14 to 82 units/L) 08/30/2020 CT chest with contrast: Perihilar traction bronchiectasis, parenchymal retraction and architectural distortion consistent with history of sarcoid.  Left ventricular dilatation.  3 mm left lower lobe nodule. 08/30/2020 echocardiogram: LVEF 45 to 50%, LV with mildly decreased function, global hypokinesis, LV cavity size  severely dilated, grade II  diastolic dysfunction. 10/24/2020 cardiac morphology MR: Mild to moderate dilated left ventricle, normal LV thickness, global hypokinesis, no regional wall motion abnormalities.  Reduced RV and LV systolic function LVEF 36%.  No evidence for infiltrative disease.  Negative for cardiac sarcoidosis. 12/15/2020 CT coronary morphology: Coronary calcium score of 0, no evidence of CAD.  Lung windows findings as previous. 07/06/2021 myocardial PET/[facility]): Abnormal perfusion of the myocardium, normal wall motion and normal thickening.  No coronary artery calcifications, evidence of extracardiac areas in the chest of hypermetabolism suggest sarcoidosis.  Focal increased FDG uptake in the distal esophagus query esophagitis. 08/14/2021 CT chest with contrast: Stable exam from prior, no progressive findings, architectural distortion and scarring bilaterally compatible with prior history of sarcoidosis.  Stable 3 mm left lower lobe pulmonary nodule.  Pulmonary emphysema. 10/05/2021 PFTs: FEV1 2.21 L or 64% predicted, FVC 3.86 L or 87% predicted, FEV1/FVC 57%, low normal lung volumes, diffusion capacity normal.  Consistent with moderate obstruction.   10/19/2021 PET CT myocardial [facility]): Normal myocardial perfusion, normal left ventricular systolic function, wall motion and thickening, dilated left ventricle.  No areas of extracardiac hypermetabolic activity. 03/19/2022 2D echo: LVEF 40 to 45%, left ventricle demonstrates global hypokinesis. LVEF slightly worse than 08/2020 echocardiogram. 06/06/2022 PET/CT myocardial [facility]): 1. There are several segments ( 7 out of 17) with hypermetabolic activity suggestive of an active inflammatory process in the left ventricular myocardium. 2. Approximately 44% of the left ventricle is involved with predominantly mild/moderate/severe hypermetabolic activity. 3. There is evidence of abnormal metabolism in the right ventricle.  4. Overall findings are consistent with relapse of  disease (Sarcoidosis).  06/11/2022 QuantiFERON gold: NEGATIVE 09/05/2022 PET/CT myocardial ([facility]):1. There are several segments with hypermetabolic activity suggestive of an active inflammatory process in the left ventricular myocardium. Previously 7 out of 17 segments and now 9 out of 17 segments. 2. Approximately 58% of the left ventricle is involved with predominantly mild/moderate/severe hypermetabolic activity. 3. There is evidence of mild abnormal metabolism in the right ventricle.  10/11/2022 cardiopulmonary stress test: Spirometry pretest FEV1 2.19 L or 64% predicted, FVC 4.80 L or 94% predicted, FEV1/FVC 54%.  MVV 94 (67%) overall patient gave very good effort.  Pulsoxymeter remained 99 to 100% for the duration of the exercise.  The exercise was performed on a cycle ergometer.  The spirometry does not show significant change from 2023 spirometric values.  The test overall showed low normal functional capacity when compared to much sedentary norms.  Patient appears predominantly pulmonary limited with mixed restrictive/obstructive lung physiology. 05/31/2023 PFTs: FEV1 1.72 L or 50% predicted FVC 3.60 L or 82% predicted FEV1/FVC 48% lung volumes normal with air trapping noted by RV.  Diffusion capacity normal.  Findings consistent with severe obstructive lung disease.   Review of Systems A 10 point review of systems was performed and it is as noted above otherwise negative.   Patient Active Problem List   Diagnosis Date Noted   Adrenal insufficiency (HCC) 05/09/2023   Long term current use of systemic steroids 05/09/2023   High risk medication use 06/11/2022   Generalized osteoarthritis 06/11/2022   Cardiac sarcoidosis 11/06/2021   Stage 2 moderate COPD by GOLD classification (HCC) 11/06/2021   Former smoker 11/06/2021   Shortness of breath 11/06/2021   Right bundle branch block 09/10/2018   GERD (gastroesophageal reflux disease) 06/25/2018   Sarcoidosis of lung (HCC) 06/25/2018    Chronic right shoulder pain 06/25/2018   History of migraine 06/25/2018  History of kidney stones 06/25/2018    Social History   Tobacco Use   Smoking status: Former    Current packs/day: 0.00    Average packs/day: 1 pack/day for 25.0 years (25.0 ttl pk-yrs)    Types: Cigarettes    Start date: 07/24/1983    Quit date: 07/23/2008    Years since quitting: 15.1    Passive exposure: Never   Smokeless tobacco: Former    Types: Chew    Quit date: 07/24/2020  Substance Use Topics   Alcohol use: Not Currently    No Known Allergies  Current Meds  Medication Sig   adalimumab (HUMIRA) 40 MG/0.4ML pen Inject 0.4 mLs (40 mg total) into the skin every 14 (fourteen) days.   empagliflozin (JARDIANCE) 10 MG TABS tablet Take 1 tablet (10 mg total) by mouth daily before breakfast.   metoprolol succinate (TOPROL-XL) 25 MG 24 hr tablet TAKE 1/2 TABLET BY MOUTH EVERY DAY   Multiple Vitamins-Minerals (ZINC PO) Take by mouth.   OHTUVAYRE 3 MG/2.5ML SUSP Inhale 3 mg into the lungs in the morning and at bedtime.   spironolactone (ALDACTONE) 25 MG tablet Take 0.5 tablets by mouth once.   Tiotropium Bromide-Olodaterol (STIOLTO RESPIMAT) 2.5-2.5 MCG/ACT AERS Inhale 2 puffs into the lungs daily.    Immunization History  Administered Date(s) Administered   Influenza,inj,Quad PF,6+ Mos 09/08/2012   Influenza-Unspecified 05/07/2011   Pneumococcal Polysaccharide-23 06/25/2018   Tdap 03/24/2013, 06/25/2018      Objective:     BP 110/62 (BP Location: Right Arm, Cuff Size: Normal)   Pulse 70   Temp (!) 96.9 F (36.1 C)   Ht 5\' 6"  (1.676 m)   Wt 178 lb (80.7 kg)   SpO2 97%   BMI 28.73 kg/m   SpO2: 97 % O2 Device: None (Room air)  GENERAL: Well-developed, well-nourished gentleman, no acute distress, fully ambulatory, no conversational dyspnea. HEAD: Normocephalic, atraumatic.  EYES: Pupils equal, round, reactive to light.  No scleral icterus.  MOUTH: Natural dentition, moist.  No thrush. NECK:  Supple. No thyromegaly. Trachea midline. No JVD.  No adenopathy. PULMONARY: Good air entry bilaterally. No adventitious sounds.  CARDIOVASCULAR: S1 and S2. Regular rate and rhythm.  No rubs, murmurs or gallops heard. ABDOMEN: Benign. MUSCULOSKELETAL: No joint deformity, no clubbing, no edema.  Tenderness along the right shoulder. NEUROLOGIC: No overt focal deficit, no gait disturbance noted.  Speech is fluent. SKIN: Intact,warm,dry.  On limited exam no rashes. PSYCH: Mood and behavior normal.   Assessment & Plan:     ICD-10-CM   1. Stage 2 moderate COPD by GOLD classification (HCC)  J44.9 Ambulatory Referral for DME    2. Sarcoidosis  D86.9 Ambulatory Referral for DME      Orders Placed This Encounter  Procedures   Ambulatory Referral for DME    Referral Priority:   Routine    Referral Type:   Durable Medical Equipment Purchase    Number of Visits Requested:   1   Discussion:    Sarcoidosis with Pulmonary and Cardiac Involvement Chronic condition with pulmonary and cardiac involvement. Reports no change in breathing. Off prednisone for over a month, experiencing muscle and body aches, particularly in hands and fingertips. Next PET scan due in April. Humira approved for another year. Discussed potential weaning off Jardiance, metoprolol, and spironolactone, but no changes made at this time as the patient has to discuss these with cardiology. - PFTs will be due in November - Schedule follow-up in three months  Chronic Obstructive  Pulmonary Disease (COPD) Chronic condition requiring nebulizer treatment. Insurance denied aerosol delivery device. Discussed alternative options for obtaining nebulizer and necessary cup. Recommended purchasing from [email or web address] or [employer] if not available through DME companies. - Send request to DME company for nebulizer with supplies - Consider purchasing nebulizer from [email or web address] or [employer] if not available through DME  General Health  Maintenance Discussed potential weaning off medications. - Continue current medications: Humira, Jardiance, metoprolol, spironolactone - Monitor for potential weaning off medications as advised by cardiology   Follow-up - Schedule PET scan in April - cards to do - Enrolled in lung cancer screening program - Follow-up appointment in three months.      Advised if symptoms do not improve or worsen, to please contact office for sooner follow up or seek emergency care.    I spent 32 minutes of dedicated to the care of this patient on the date of this encounter to include pre-visit review of records, face-to-face time with the patient discussing conditions above, post visit ordering of testing, clinical documentation with the electronic health record, making appropriate referrals as documented, and communicating necessary findings to members of the patients care team.     C. Danice Goltz, MD Advanced Bronchoscopy PCCM [facility]    *This note was generated using voice recognition software/Dragon and/or AI transcription program.  Despite best efforts to proofread, errors can occur which can change the meaning. Any transcriptional errors that result from this process are unintentional and may not be fully corrected at the time of dictation.

## 2023-09-20 ENCOUNTER — Other Ambulatory Visit (HOSPITAL_COMMUNITY): Payer: Self-pay | Admitting: Cardiology

## 2023-10-23 NOTE — Progress Notes (Signed)
 Office Visit Note  Patient: Steve Wang             Date of Birth: 30-Jul-1970           MRN: [id number]             PCP: Lamon Pillow, MD Referring: Lamon Pillow, MD Visit Date: 11/06/2023   Subjective:  Follow-up   Discussed the use of AI scribe software for clinical note transcription with the patient, who gave verbal consent to proceed.  History of Present Illness   Steve Wang is a 53 y.o. male here for follow up for sarcoidosis with cardiac and pulmonary involvement currently on Humira  40 mg subcu q. 14 days    He is undergoing a repeat nuclear medicine scan to assess for any remaining heart muscle involvement. His previous PET scan showed no cardiac involvement, and he has been off prednisone  since December. He is currently on Humira , Jardiance , metoprolol , and spironolactone. He wants to reduce his medication regimen if possible, particularly wanting to remain on Humira  alone.  He experiences morning stiffness and joint pain affecting his wrists, ankles, and feet, which improves after one to two hours of activity. He takes ibuprofen  1200 mg once in the morning, though not daily, and is interested in trying an 800 mg prescription ibuprofen  for convenience. He dislikes taking multiple pills and prefers to manage his symptoms with minimal medication.  He experiences seasonal allergies, primarily due to pollen, and takes two Claritin in the morning and two at night. His eosinophil count was slightly elevated in his last blood test, which he attributes to his allergies.  He administers Humira  injections in his thigh, noting variability in sensation during administration.  He has a history of bone spurs in his right shoulder, causing occasional discomfort, especially at night depending on his sleeping position.  No recent illnesses aside from allergies and no leg swelling. He has seen a dermatologist for biopsies, which were benign.     Previous HPI 08/08/2023 Steve Wang is a 53 y.o. male here for follow up  for sarcoidosis with cardiac and pulmonary involvement currently on Humira  40 mg subcu q. 14 days and tapering prednisone  currently at 1.25 mg daily.  He had labs checked this morning for monitoring with disease activity also checking a.m. cortisol level with history of long-term chronic steroid exposure.  No significant respiratory or circulatory symptoms most the time he still gets short of breath with exertion easier than usual and feels generally tired.  No cough or chest pain.  He has not had any known illness though with multiple sick contacts at home.  Has mild pain and stiffness in his hands with stiffness lasting up to 3 hours in the morning.  Not needing any extra medication for this specifically.     Previous HPI 05/08/2023 Steve Wang is a 53 y.o. male here for follow up for sarcoidosis with cardiac and pulmonary involvement currently on Humira  40 mg subcu q. 14 days and tapering prednisone  currently 7.5 mg daily.  Since her last visit he had updated PET CT scan with cardiac disease appears quiescent.  He just followed up with his [facility] clinic visit on the ninth agreement with tapering steroid plan.  He still feels a high level of fatigue throughout the day and has dyspnea on exertion. He exercises at home regularly. He was recommended for a brain MRI to rule out neuro involvement due to persistent brain fog/cognitive difficulty. He took a course of  doxycycline  for a tooth infection but no other serious infections.   Previous HPI 02/05/2023 Steve Wang is a 53 y.o. male here for follow up for sarcoidosis with cardiac and pulmonary involvement currently on Humira  40 mg subcu q. 14 days and prednisone  10 mg daily.  Since her last visit he thinks there is maybe slight improvement in his dyspnea on exertion but otherwise feels mostly the same.  He did break a toe on his right foot striking on a chair leg accidentally.  He is experiencing some hand stiffness  and burning type pain in the morning sometimes lasting until noon.  Not associated with any increased swelling or visible changes.  No new rashes and he has not been sick with anything requiring antibiotics.  He is scheduled for an updated PET CT scan for cardiac involvement tomorrow.   Previous HPI 10/31/22 Steve Wang is a 53 y.o. male here for follow up for sarcoidosis with cardiac and pulmonary involvement on prednisone  15 mg daily and recently started Humira  40 mg subcu q. 14 days with 3 doses taken so far.  He has not noticed any particular side effect or intolerance with taking the medication.  He continues having some shortness of breath with exertion he can make it about 2 flights of stairs before getting winded.  He followed up with Dr. Viva Grise about asthma/COPD inhaler treatment and pulmonary rehab. Other symptom has been extensive body aches in the mornings in the past month. This has partially improved since the onset but still has hours of morning stiffness, and pain lasting throughout the day. He thinks decreasing prednisone  from 20 to 15 mg daily may have actually improved this, and saw no benefit with additional ibuprofen .   Previous HPI 06/11/22 Steve Wang is a 53 y.o. male here for management of sarcoidosis. He was originally diagnosed by bronchoscopy and possibly biopsy (not available for review to me at this time) in 2012 with initial treatment course of methotrexate  and prednisone  for a year but was not on long term immunosuppression and without disease activity. More recently concern due to decreased in EF under close monitoring with heart failure clinic possibly thought reltaed to COVID-19 infection and inflammation. Cardiac MRI in 2022 unremarkable for inflammatory changes as was PET/CT surveillance in March. However he continued to developed symptomatic disease this improved on methotrexate  and prednisone  but had return of joint pains with prednisone  tapering. Repeat PET/CT  obtained 11/15 now showing evidence of increased metabolic uptake in both ventricular wall as mediastinal lymph nodes concerning for active sarcoidosis.  His symptoms are not very noticeable at baseline he can climb about 3 flights of stairs without resting but would be winded by the end of it and recover after several minutes.  He has not having any cough or chest pain.  He has noticed episodic skin rash usually breaking out on his scalp in the center of the chest and seems to correspond with when he has more exertional dyspnea or mild coughing.  He has some joint pain in hands and feet in the morning she was improved after 5 to 10 minutes.  He does not see any visible swelling or erythema.  He has never had any eye inflammation or vision change during this time. Recently this year he was resumed on methotrexate  treatment but he did not tolerate this well felt a lot of brain fog and general malaise symptoms.  He never noticed much difference in symptoms with the methotrexate  and so far is only seen  a difference when prescribed steroids.  He stopped taking the methotrexate  since about 5 weeks ago due to the side effects and not seeing a difference.  He is currently on a prolonged prednisone  taper based on findings for active cardiac sarcoidosis currently 30 mg daily plan to taper down and maintain on 10 mg dose until February follow-up repeat scan.   Imaging reviewed 06/06/22 PET/CT Cardiac PET/CT metabolic study with F-18 FDG and scanned on PET [facility]: 1. There are several segments ( 7 out of 17) with hypermetabolic activity suggestive of an active inflammatory process in the left ventricular myocardium. 2. Approximately 44% of the left ventricle is involved with predominantly mild/moderate/severe hypermetabolic activity. 3. There is evidence of abnormal metabolism in the right ventricle. 4. Overall findings are consistent with relapse of disease (Sarcoidosis).  Perfusion/Function.  Gated cardiac  PET/CT rest myocardial perfusion study with [id number] demonstrates: 1. Normal myocardial perfusion. 2. Dilated left ventricle with normal left ventricular systolic function.  Non-diagnostic CT obtained for attenuation correction:  1. There are no discernible coronary artery calcifications. 2. There are extensive areas of extracardiac hypermetabolic activity noted on the limited field of view, specifically bilateral hilar, mediastinal lymph nodes, and extensive lung opacities, and retroperitoneal lymphnodes. Those findings were not present on most recent exam.   Review of Systems  Constitutional:  Negative for fatigue.  HENT:  Negative for mouth sores and mouth dryness.   Eyes:  Negative for dryness.  Respiratory:  Positive for shortness of breath.   Cardiovascular:  Negative for chest pain and palpitations.  Gastrointestinal:  Negative for blood in stool, constipation and diarrhea.  Endocrine: Negative for increased urination.  Genitourinary:  Negative for involuntary urination.  Musculoskeletal:  Positive for joint pain, joint pain, myalgias, morning stiffness and myalgias. Negative for gait problem, joint swelling, muscle weakness and muscle tenderness.  Skin:  Negative for color change, rash, hair loss and sensitivity to sunlight.  Allergic/Immunologic: Negative for susceptible to infections.  Neurological:  Positive for dizziness and headaches.  Hematological:  Negative for swollen glands.  Psychiatric/Behavioral:  Negative for depressed mood and sleep disturbance. The patient is not nervous/anxious.     PMFS History:  Patient Active Problem List   Diagnosis Date Noted   Adrenal insufficiency (HCC) 05/09/2023   Long term current use of systemic steroids 05/09/2023   High risk medication use 06/11/2022   Generalized osteoarthritis 06/11/2022   Cardiac sarcoidosis 11/06/2021   Stage 2 moderate COPD by GOLD classification (HCC) 11/06/2021   Former smoker 11/06/2021   Shortness of  breath 11/06/2021   Right bundle branch block 09/10/2018   GERD (gastroesophageal reflux disease) 06/25/2018   Sarcoidosis of lung (HCC) 06/25/2018   Chronic right shoulder pain 06/25/2018   History of migraine 06/25/2018   History of kidney stones 06/25/2018    Past Medical History:  Diagnosis Date   COPD (chronic obstructive pulmonary disease) (HCC)    Emphysema of lung (HCC)    GERD (gastroesophageal reflux disease)    HFrEF (heart failure with reduced ejection fraction) (HCC)    a. 09/2018 Echo: EF 55-60%; b. 08/2020 Echo: EF 45-50%, grade 2 diastolic dysfunction; c. 10/2020 cMRI: EF 36%, mild to mod dil LV. Mod red RV fxn. No LGE/evidence of sarcoid. No signif valvular dzs   NICM (nonischemic cardiomyopathy) (HCC)    a. 09/2018 Echo: EF 55-60%; b. 08/2020 Echo: EF 45-50%; c. 10/2020 cMRI: EF 36%, mild to mod dil LV. Mod red RV fxn. No LGE/evidence of sarcoid. No signif  valvular dzs; d. 12/2020 Cor CTA: Ca2+= 0. Nl cors.   Pulmonary nodule    a. 12/2020 CT chest: 1.3cm posterior basal LUL nodule and 3mm LLL nodule - unchanged.   RBBB    Sarcoidosis    a. Dx 10/2020 in [place]  - prev on steroids/methotrexate .    Family History  Problem Relation Age of Onset   COPD Mother    Colon cancer Neg Hx    Pancreatic cancer Neg Hx    Liver cancer Neg Hx    Esophageal cancer Neg Hx    Stomach cancer Neg Hx    Past Surgical History:  Procedure Laterality Date   BRONCHOSCOPY  11/09/2010   [facility] [place]    COLONOSCOPY     UPPER GI ENDOSCOPY     Social History   Social History Narrative   Not on file   Immunization History  Administered Date(s) Administered   Influenza,inj,Quad PF,6+ Mos 09/08/2012   Influenza-Unspecified 05/07/2011   Pneumococcal Polysaccharide-23 06/25/2018   Tdap 03/24/2013, 06/25/2018     Objective: Vital Signs: BP 118/77 (BP Location: Right Arm, Patient Position: Sitting, Cuff Size: Large)   Pulse 73   Resp 14   Ht 5\' 6"  (1.676 m)   Wt 171 lb (77.6  kg)   BMI 27.60 kg/m    Physical Exam Eyes:     Conjunctiva/sclera: Conjunctivae normal.  Cardiovascular:     Rate and Rhythm: Normal rate and regular rhythm.  Pulmonary:     Effort: Pulmonary effort is normal.     Breath sounds: Normal breath sounds.  Lymphadenopathy:     Cervical: No cervical adenopathy.  Skin:    General: Skin is warm and dry.  Neurological:     Mental Status: He is alert.  Psychiatric:        Mood and Affect: Mood normal.      Musculoskeletal Exam:  Neck full ROM no tenderness Shoulders full ROM , right shoulder pain with overhead abduction Elbows full ROM no tenderness or swelling Wrists full ROM no tenderness or swelling Fingers full ROM no tenderness or swelling Knees full ROM no tenderness or swelling   Investigation: No additional findings.  Imaging: No results found.  Recent Labs: Lab Results  Component Value Date   WBC 5.0 11/06/2023   HGB 15.6 11/06/2023   PLT 208 11/06/2023   NA 136 11/06/2023   K 4.7 11/06/2023   CL 103 11/06/2023   CO2 23 11/06/2023   GLUCOSE 89 11/06/2023   BUN 17 11/06/2023   CREATININE 0.88 11/06/2023   BILITOT 0.5 11/06/2023   ALKPHOS 52 08/08/2023   AST 21 11/06/2023   ALT 29 11/06/2023   PROT 7.5 11/06/2023   ALBUMIN 4.3 08/08/2023   CALCIUM  9.3 11/06/2023   GFRAA 116 06/25/2018   QFTBGOLDPLUS NEGATIVE 05/08/2023    Speciality Comments: Humira  started 09/26/22  Procedures:  No procedures performed Allergies: Patient has no known allergies.   Assessment / Plan:     Visit Diagnoses: Cardiac sarcoidosis - Plan: C-reactive protein No current cardiac involvement on PET scan. Ejection fraction improved to 50%. - Plan to continue Humira  40 mg subcu q. 14 days  - Repeat PET scan to assess cardiac involvement.  High risk medication use - Humira  40 mg subcu q. 14 days - Plan: CBC with Differential/Platelet, Comprehensive metabolic panel with GFR - No serious interval infections - Checking CBC and  CMP for medication monitoring on continued Humira   Osteoarthritis Morning stiffness and joint pain in wrists, ankles,  and feet. Exceeds recommended ibuprofen  dose. Right shoulder pain due to bone spurs, variable with sleeping position. - Prescribe 800 mg ibuprofen  tablets. - Advise not to exceed 800 mg per dose. - Encourage as-needed use of ibuprofen .  Allergic rhinitis Allergies with high eosinophil count, managed with Claritin.   Orders: Orders Placed This Encounter  Procedures   CBC with Differential/Platelet   Comprehensive metabolic panel with GFR   C-reactive protein   Meds ordered this encounter  Medications   ibuprofen  (ADVIL ) 800 MG tablet    Sig: Take 1 tablet (800 mg total) by mouth daily as needed.    Dispense:  30 tablet    Refill:  2     Follow-Up Instructions: Return in about 3 months (around 02/05/2024) for Sarcoidosis/OA on ADA/NSAID f/u 3mos.   Matt Song, MD  Note - This record has been created using [employer].  Chart creation errors have been sought, but may not always  have been located. Such creation errors do not reflect on  the standard of medical care.

## 2023-10-25 ENCOUNTER — Telehealth: Payer: Self-pay | Admitting: Pharmacist

## 2023-10-25 NOTE — Telephone Encounter (Signed)
 Patient appears to have change in insurance.  Submitted a Prior Authorization request to [facility] for [facility] via CoverMyMeds. Will update once we receive a response.  Key: [facility]   Patient may have to try and fail roflumilast first based on PA questions

## 2023-10-28 NOTE — Telephone Encounter (Signed)
 Received notification from [facility] regarding a prior authorization for [facility]. Authorization has been APPROVED from 10/25/23 to 10/24/24. Approval letter sent to scan center.  Authorization # [id number]  Chesley Mires, PharmD, MPH, BCPS, CPP Clinical Pharmacist (Rheumatology and Pulmonology)

## 2023-10-30 ENCOUNTER — Other Ambulatory Visit: Payer: Self-pay | Admitting: Internal Medicine

## 2023-10-30 DIAGNOSIS — D860 Sarcoidosis of lung: Secondary | ICD-10-CM

## 2023-10-30 DIAGNOSIS — D8685 Sarcoid myocarditis: Secondary | ICD-10-CM

## 2023-10-31 ENCOUNTER — Other Ambulatory Visit: Payer: Self-pay | Admitting: *Deleted

## 2023-10-31 ENCOUNTER — Encounter: Payer: Self-pay | Admitting: Internal Medicine

## 2023-10-31 MED ORDER — ADALIMUMAB 40 MG/0.4ML ~~LOC~~ AJKT
40.0000 mg | AUTO-INJECTOR | SUBCUTANEOUS | 0 refills | Status: DC
Start: 2023-10-31 — End: 2024-01-13

## 2023-10-31 NOTE — Telephone Encounter (Signed)
 See refill request note from 10/30/2023

## 2023-10-31 NOTE — Telephone Encounter (Signed)
 Last Fill: 07/30/2023  Labs: 08/08/2023 EOS 0.7  TB Gold: 05/08/2023 Negative  Next Visit: 11/06/2023  Last Visit: 08/08/2023  [id number] sarcoidosis   Current Dose per office note 08/08/2023: Humira 40 mg subcu q. 14 days   Okay to refill Humira?

## 2023-11-06 ENCOUNTER — Ambulatory Visit: Payer: BC Managed Care – PPO | Attending: Internal Medicine | Admitting: Internal Medicine

## 2023-11-06 ENCOUNTER — Encounter: Payer: Self-pay | Admitting: Internal Medicine

## 2023-11-06 VITALS — BP 118/77 | HR 73 | Resp 14 | Ht 66.0 in | Wt 171.0 lb

## 2023-11-06 DIAGNOSIS — D8685 Sarcoid myocarditis: Secondary | ICD-10-CM | POA: Diagnosis not present

## 2023-11-06 DIAGNOSIS — Z79899 Other long term (current) drug therapy: Secondary | ICD-10-CM

## 2023-11-06 DIAGNOSIS — Z7952 Long term (current) use of systemic steroids: Secondary | ICD-10-CM

## 2023-11-06 DIAGNOSIS — M159 Polyosteoarthritis, unspecified: Secondary | ICD-10-CM

## 2023-11-06 MED ORDER — IBUPROFEN 800 MG PO TABS: 800.0000 mg | ORAL_TABLET | Freq: Every day | ORAL | 2 refills | Status: DC | PRN

## 2023-11-07 LAB — C-REACTIVE PROTEIN: CRP: 19.7 mg/L — ABNORMAL HIGH (ref <8.0)

## 2023-11-07 LAB — COMPREHENSIVE METABOLIC PANEL WITH GFR
AG Ratio: 1.5 (calc) (ref 1.0–2.5)
ALT: 29 U/L (ref 9–46)
AST: 21 U/L (ref 10–35)
Albumin: 4.5 g/dL (ref 3.6–5.1)
Alkaline phosphatase (APISO): 56 U/L (ref 35–144)
BUN: 17 mg/dL (ref 7–25)
CO2: 23 mmol/L (ref 20–32)
Calcium: 9.3 mg/dL (ref 8.6–10.3)
Chloride: 103 mmol/L (ref 98–110)
Creat: 0.88 mg/dL (ref 0.70–1.30)
Globulin: 3 g/dL (ref 1.9–3.7)
Glucose, Bld: 89 mg/dL (ref 65–99)
Potassium: 4.7 mmol/L (ref 3.5–5.3)
Sodium: 136 mmol/L (ref 135–146)
Total Bilirubin: 0.5 mg/dL (ref 0.2–1.2)
Total Protein: 7.5 g/dL (ref 6.1–8.1)
eGFR: 103 mL/min/{1.73_m2} (ref >=60)

## 2023-11-07 LAB — CBC WITH DIFFERENTIAL/PLATELET
Absolute Lymphocytes: 1250 {cells}/uL (ref 850–3900)
Absolute Monocytes: 875 {cells}/uL (ref 200–950)
Basophils Absolute: 50 {cells}/uL (ref 0–200)
Basophils Relative: 1 %
Eosinophils Absolute: 525 {cells}/uL — ABNORMAL HIGH (ref 15–500)
Eosinophils Relative: 10.5 %
HCT: 47.1 % (ref 38.5–50.0)
Hemoglobin: 15.6 g/dL (ref 13.2–17.1)
MCH: 30.3 pg (ref 27.0–33.0)
MCHC: 33.1 g/dL (ref 32.0–36.0)
MCV: 91.5 fL (ref 80.0–100.0)
MPV: 11.8 fL (ref 7.5–12.5)
Monocytes Relative: 17.5 %
Neutro Abs: 2300 {cells}/uL (ref 1500–7800)
Neutrophils Relative %: 46 %
Platelets: 208 10*3/uL (ref 140–400)
RBC: 5.15 10*6/uL (ref 4.20–5.80)
RDW: 12.1 % (ref 11.0–15.0)
Total Lymphocyte: 25 %
WBC: 5 10*3/uL (ref 3.8–10.8)

## 2023-12-10 ENCOUNTER — Other Ambulatory Visit (HOSPITAL_COMMUNITY): Payer: Self-pay | Admitting: Cardiology

## 2023-12-11 ENCOUNTER — Ambulatory Visit: Payer: BC Managed Care – PPO | Admitting: Pulmonary Disease

## 2024-01-13 ENCOUNTER — Other Ambulatory Visit: Payer: Self-pay

## 2024-01-13 DIAGNOSIS — D8685 Sarcoid myocarditis: Secondary | ICD-10-CM

## 2024-01-13 DIAGNOSIS — D860 Sarcoidosis of lung: Secondary | ICD-10-CM

## 2024-01-13 MED ORDER — ADALIMUMAB 40 MG/0.4ML ~~LOC~~ AJKT: 40.0000 mg | AUTO-INJECTOR | SUBCUTANEOUS | 0 refills | Status: DC

## 2024-01-13 NOTE — Telephone Encounter (Signed)
 Refill request received via fax from [facility] for Humira   Last Fill: 10/31/2023  Labs: 11/06/2023 Eosinophils Absolute 525  TB Gold: 05/08/2023 Negative   Next Visit: 02/05/2024  Last Visit: 11/06/2023  [id number] sarcoidosis   Current Dose per office note 11/06/2023: Humira  40 mg subcu q. 14 days   Okay to refill Humira ?

## 2024-01-21 ENCOUNTER — Ambulatory Visit (INDEPENDENT_AMBULATORY_CARE_PROVIDER_SITE_OTHER): Admitting: Pulmonary Disease

## 2024-01-21 ENCOUNTER — Encounter: Payer: Self-pay | Admitting: Pulmonary Disease

## 2024-01-21 VITALS — BP 110/72 | HR 62 | Temp 99.0°F | Ht 66.0 in | Wt 165.0 lb

## 2024-01-21 DIAGNOSIS — J449 Chronic obstructive pulmonary disease, unspecified: Secondary | ICD-10-CM | POA: Diagnosis not present

## 2024-01-21 DIAGNOSIS — R0602 Shortness of breath: Secondary | ICD-10-CM

## 2024-01-21 DIAGNOSIS — D869 Sarcoidosis, unspecified: Secondary | ICD-10-CM | POA: Diagnosis not present

## 2024-01-21 DIAGNOSIS — Z87891 Personal history of nicotine dependence: Secondary | ICD-10-CM | POA: Diagnosis not present

## 2024-01-21 NOTE — Progress Notes (Unsigned)
 Subjective:    Patient ID: Steve Wang, male    DOB: 1971/02/17, 54 y.o.   MRN: [id number]  Patient Care Team: Steve Nancyann BRAVO, MD as PCP - General (Family Medicine) Steve Evalene PARAS, MD as PCP - Cardiology (Cardiology) Steve Dedra CROME, MD as Consulting Physician (Pulmonary Disease)  Chief Complaint  Patient presents with   Follow-up    No breathing problems.     BACKGROUND/INTERVAL:Steve Wang is a 53 year old very complex former smoker with 25-pack-year history of smoking and a history of sarcoidosis first diagnosed in 2012 at the [facility] [place]  by lung biopsy. 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  He has been started by [facility] rheumatology in March 2024 on Humira  for management of his cardiac sarcoid as he has continued to show activity on methotrexate  and prednisone .  He follows with cardiac PET/CTs at [facility].  He also follows with cardiology through [facility]. The patient was last seen here on 13 September 2023.  He also has a COPD component and is on Stiolto for the same.  He discontinued Ohtuvayre  due to feeling it did not help him.SABRA   HPI Discussed the use of AI scribe software for clinical note transcription with the patient, who gave verbal consent to proceed.  History of Present Illness   Steve Wang is a 53 year old male with sarcoidosis and COPD who presents for follow-up.  His symptoms have remained stable without significant worsening. He uses Stiolto for COPD as needed but not daily.  He cannot explain why he does not use the inhaler daily.  He is currently taking Jardiance , metoprolol , and Humira . He questions the effectiveness of Humira  for sarcoidosis, noting its benefit for cardiac sarcoidosis but persistent lung involvement.  However, it appears that the PET avidity on his lung was related to mediastinal adenopathy which was likely reactive.  He experiences joint stiffness, particularly  in the mornings or after lying down for extended periods.  He was advised this may be related to sarcoid arthritis.  Frequent PET scans have shown activity in the lungs, attributed to chronic scarring and inflammation.  Repeated chest CTs have not shown progression of disease.  He has had pulmonary function tests (PFTs) in the past.     DATA: 10/13/2010 CT chest: Report only, performed at [facility] [place] : Upper lung predominant parenchymal changes, scarring and traction bronchiectasis with small peribronchial and perifissural nodules.  Enlarged bilateral hilar and mediastinal lymph nodes, findings all suggestive of sarcoidosis. 11/03/2010: ACE level 98, eosinophils elevated 8.1% 11/09/2010 Bronchoscopy specimens: [facility] , noncaseating granulomas right upper lobe, fungal culture negative for nocardia, Doratomyces species isolated, contaminant.  BAL 63% histiocytes, 33% lymphocytes, 2% neutrophils, 2% eosinophils.  Flow cytometry favored sarcoidosis. 09/21/2013 spirometry: Performed at [facility] [place] , FEV1 2.72 L or 76% predicted, FVC 4.42 L or 95% predicted, FEV1/FVC 61%, DLCO moderately reduced.  Consistent with mild obstructive defect (not congruent with sarcoidosis). 09/22/2018 2D echo: Performed in [facility], [place], LVEF 55 to 60% mild LV dilatation, no wall motion abnormalities, otherwise normal. 08/11/2020 angiotensin-converting enzyme: 61 (reference range 14 to 82 units/L) 08/30/2020 CT chest with contrast: Perihilar traction bronchiectasis, parenchymal retraction and architectural distortion consistent with history of sarcoid.  Left ventricular dilatation.  3 mm left lower lobe nodule. 08/30/2020 echocardiogram: LVEF 45 to 50%, LV with mildly decreased function, global hypokinesis, LV cavity size severely dilated, grade II diastolic dysfunction. 10/24/2020 cardiac morphology MR: Mild to moderate  dilated left ventricle, normal LV thickness, global hypokinesis, no  regional wall motion abnormalities.  Reduced RV and LV systolic function LVEF 36%.  No evidence for infiltrative disease.  Negative for cardiac sarcoidosis. 12/15/2020 CT coronary morphology: Coronary calcium  score of 0, no evidence of CAD.  Lung windows findings as previous. 07/06/2021 myocardial PET/CT [facility]): Abnormal perfusion of the myocardium, normal wall motion and normal thickening.  No coronary artery calcifications, evidence of extracardiac areas in the chest of hypermetabolism suggest sarcoidosis.  Focal increased FDG uptake in the distal esophagus query esophagitis. 08/14/2021 CT chest with contrast: Stable exam from prior, no progressive findings, architectural distortion and scarring bilaterally compatible with prior history of sarcoidosis.  Stable 3 mm left lower lobe pulmonary nodule.  Pulmonary emphysema. 10/05/2021 PFTs: FEV1 2.21 L or 64% predicted, FVC 3.86 L or 87% predicted, FEV1/FVC 57%, low normal lung volumes, diffusion capacity normal.  Consistent with moderate obstruction.   10/19/2021 PET CT myocardial [facility]): Normal myocardial perfusion, normal left ventricular systolic function, wall motion and thickening, dilated left ventricle.  No areas of extracardiac hypermetabolic activity. 03/19/2022 2D echo: LVEF 40 to 45%, left ventricle demonstrates global hypokinesis. LVEF slightly worse than 08/2020 echocardiogram. 06/06/2022 PET/CT myocardial [facility]): 1. There are several segments ( 7 out of 17) with hypermetabolic activity suggestive of an active inflammatory process in the left ventricular myocardium. 2. Approximately 44% of the left ventricle is involved with predominantly mild/moderate/severe hypermetabolic activity. 3. There is evidence of abnormal metabolism in the right ventricle.  4. Overall findings are consistent with relapse of disease (Sarcoidosis).  06/11/2022 QuantiFERON gold: NEGATIVE 09/05/2022 PET/CT myocardial ([facility]):1. There are several segments with  hypermetabolic activity suggestive of an active inflammatory process in the left ventricular myocardium. Previously 7 out of 17 segments and now 9 out of 17 segments. 2. Approximately 58% of the left ventricle is involved with predominantly mild/moderate/severe hypermetabolic activity. 3. There is evidence of mild abnormal metabolism in the right ventricle.  10/11/2022 cardiopulmonary stress test: Spirometry pretest FEV1 2.19 L or 64% predicted, FVC 4.80 L or 94% predicted, FEV1/FVC 54%.  MVV 94 (67%) overall patient gave very good effort.  Pulsoxymeter remained 99 to 100% for the duration of the exercise.  The exercise was performed on a cycle ergometer.  The spirometry does not show significant change from 2023 spirometric values.  The test overall showed low normal functional capacity when compared to much sedentary norms.  Patient appears predominantly pulmonary limited with mixed restrictive/obstructive lung physiology. 05/31/2023 PFTs: FEV1 1.72 L or 50% predicted FVC 3.60 L or 82% predicted FEV1/FVC 48% lung volumes normal with air trapping noted by RV.  Diffusion capacity normal.  Findings consistent with severe obstructive lung disease. 11/20/2023 myocardial PET/CT [facility]): Normal myocardial perfusion.  Mildly reduced left ventricular systolic function.  No discernible coronary artery calcifications.  FDG avid hilar and mediastinal nodes likely inflammatory.  Hypermetabolic perihilar scarring slightly increased from prior.  Increased uptake corresponding to facet arthritis at T7-T8.  Review of Systems A 10 point review of systems was performed and it is as noted above otherwise negative.   Patient Active Problem List   Diagnosis Date Noted   Adrenal insufficiency (HCC) 05/09/2023   Long term current use of systemic steroids 05/09/2023   High risk medication use 06/11/2022   Generalized osteoarthritis 06/11/2022   Cardiac sarcoidosis 11/06/2021   Stage 2 moderate COPD by GOLD classification  (HCC) 11/06/2021   Former smoker 11/06/2021   Shortness of breath 11/06/2021   Right bundle  branch block 09/10/2018   GERD (gastroesophageal reflux disease) 06/25/2018   Sarcoidosis of lung (HCC) 06/25/2018   Chronic right shoulder pain 06/25/2018   History of migraine 06/25/2018   History of kidney stones 06/25/2018    Social History   Tobacco Use   Smoking status: Former    Current packs/day: 0.00    Average packs/day: 1 pack/day for 25.0 years (25.0 ttl pk-yrs)    Types: Cigarettes    Start date: 07/24/1983    Quit date: 07/23/2008    Years since quitting: 15.5    Passive exposure: Never   Smokeless tobacco: Former    Types: Chew    Quit date: 07/24/2020  Substance Use Topics   Alcohol use: Not Currently    No Known Allergies  Current Meds  Medication Sig   adalimumab  (HUMIRA ) 40 MG/0.4ML pen Inject 0.4 mLs (40 mg total) into the skin every 14 (fourteen) days.   empagliflozin  (JARDIANCE ) 10 MG TABS tablet Take 1 tablet (10 mg total) by mouth daily before breakfast.   ibuprofen  (ADVIL ) 800 MG tablet Take 1 tablet (800 mg total) by mouth daily as needed.   metoprolol  succinate (TOPROL -XL) 25 MG 24 hr tablet TAKE 1/2 TABLET BY MOUTH EVERY DAY   Multiple Vitamins-Minerals (ZINC PO) Take by mouth.   Tiotropium Bromide-Olodaterol (STIOLTO RESPIMAT ) 2.5-2.5 MCG/ACT AERS Inhale 2 puffs into the lungs daily.    Immunization History  Administered Date(s) Administered   Influenza,inj,Quad PF,6+ Mos 09/08/2012   Influenza-Unspecified 05/07/2011   Pneumococcal Polysaccharide-23 06/25/2018   Tdap 03/24/2013, 06/25/2018        Objective:     BP 110/72 (BP Location: Left Arm, Patient Position: Sitting, Cuff Size: Normal)   Pulse 62   Temp 99 F (37.2 C) (Oral)   Ht 5' 6 (1.676 m)   Wt 165 lb (74.8 kg)   SpO2 97%   BMI 26.63 kg/m   SpO2: 97 %  GENERAL: Well-developed, well-nourished gentleman, no acute distress, fully ambulatory, no conversational dyspnea. HEAD:  Normocephalic, atraumatic.  EYES: Pupils equal, round, reactive to light.  No scleral icterus.  MOUTH: Natural dentition, moist.  No thrush. NECK: Supple. No thyromegaly. Trachea midline. No JVD.  No adenopathy. PULMONARY: Good air entry bilaterally. No adventitious sounds.  CARDIOVASCULAR: S1 and S2. Regular rate and rhythm.  No rubs, murmurs or gallops heard. ABDOMEN: Benign. MUSCULOSKELETAL: No joint deformity, no clubbing, no edema.  NEUROLOGIC: No overt focal deficit, no gait disturbance noted.  Speech is fluent. SKIN: Intact,warm,dry.  On limited exam no rashes. PSYCH: Mood and behavior normal.   Assessment & Plan:     ICD-10-CM   1. Stage 2 moderate COPD by GOLD classification (HCC)  J44.9 Pulmonary function test    2. Sarcoidosis  D86.9 Pulmonary function test    CT Chest High Resolution    3. Shortness of breath  R06.02       Orders Placed This Encounter  Procedures   CT Chest High Resolution    Standing Status:   Future    Expected Date:   05/25/2024    Expiration Date:   01/20/2025    Preferred imaging location?:   [facility]   Pulmonary function test    Standing Status:   Future    Expected Date:   05/25/2024    Expiration Date:   01/20/2025    Where should this test be performed?:   Outpatient Pulmonary    What type of PFT is being ordered?:   Full PFT  Discussion:    COPD COPD is well-managed with no active coughing. Stiolto is used as needed, but not daily. - Encourage daily use of Stiolto for optimal management.  Sarcoidosis with lung scarring Sarcoidosis is inactive with lung scarring. PET scans indicate activity due to scarring and possible inflammation, but no active sarcoidosis. PFTs are unchanged, indicating a stable condition. Joint stiffness may be related to sarcoid arthritis, mimicking rheumatoid arthritis. Humira  discontinuation should be addressed by a rheumatologist.  Currently given his symptoms I do not believe that he can be taken off  Humira . - Order PFTs in November. - Order high-resolution CT scan in November.      Advised if symptoms do not improve or worsen, to please contact office for sooner follow up or seek emergency care.    I spent 31 minutes of dedicated to the care of this patient on the date of this encounter to include pre-visit review of records, face-to-face time with the patient discussing conditions above, post visit ordering of testing, clinical documentation with the electronic health record, making appropriate referrals as documented, and communicating necessary findings to members of the patients care team.     C. Leita Sanders, MD Advanced Bronchoscopy PCCM [facility]    *This note was generated using voice recognition software/Dragon and/or AI transcription program.  Despite best efforts to proofread, errors can occur which can change the meaning. Any transcriptional errors that result from this process are unintentional and may not be fully corrected at the time of dictation.

## 2024-01-21 NOTE — Patient Instructions (Signed)
 VISIT SUMMARY:  You came in today for a follow-up visit regarding your sarcoidosis and COPD. Your symptoms have remained stable, and you are currently using Stiolto as needed. We discussed your current medications, including Jardiance , metoprolol , and Humira , and your concerns about their effectiveness. You also mentioned experiencing joint stiffness, which may be related to sarcoid arthritis.  YOUR PLAN:  -COPD: Chronic Obstructive Pulmonary Disease (COPD) is a long-term lung condition that makes it hard to breathe. Your COPD is well-managed, but I recommend using Stiolto daily for optimal management.  -SARCOIDOSIS WITH LUNG SCARRING: Sarcoidosis is a condition where inflammatory cells grow in different parts of your body, often affecting the lungs. Your sarcoidosis is currently inactive, but you have lung scarring and possible inflammation. We will order pulmonary function tests (PFTs) and a high-resolution CT scan in November to monitor your condition. Your joint stiffness may be related to sarcoid arthritis, and we should discuss the discontinuation of Humira  with your rheumatologist.  INSTRUCTIONS:  Please schedule your pulmonary function tests (PFTs) and high-resolution CT scan for November. Also, consult with your rheumatologist about the potential discontinuation of Humira .

## 2024-01-22 ENCOUNTER — Encounter: Payer: Self-pay | Admitting: Pulmonary Disease

## 2024-01-22 NOTE — Progress Notes (Deleted)
 Office Visit Note  Patient: Steve Wang             Date of Birth: 1971-04-28           MRN: [id number]             PCP: [name], MD Referring: [name], MD Visit Date: 02/05/2024   Subjective:  No chief complaint on file.   History of Present Illness: Steve Wang is a 53 y.o. male here for follow up for sarcoidosis with cardiac and pulmonary involvement currently on Humira  40 mg subcu q. 14 days    Previous HPI 11/06/2023 Steve Wang is a 53 y.o. male here for follow up for sarcoidosis with cardiac and pulmonary involvement currently on Humira  40 mg subcu q. 14 days     He is undergoing a repeat nuclear medicine scan to assess for any remaining heart muscle involvement. His previous PET scan showed no cardiac involvement, and he has been off prednisone  since December. He is currently on Humira , Jardiance , metoprolol , and spironolactone. He wants to reduce his medication regimen if possible, particularly wanting to remain on Humira  alone.   He experiences morning stiffness and joint pain affecting his wrists, ankles, and feet, which improves after one to two hours of activity. He takes ibuprofen  1200 mg once in the morning, though not daily, and is interested in trying an 800 mg prescription ibuprofen  for convenience. He dislikes taking multiple pills and prefers to manage his symptoms with minimal medication.   He experiences seasonal allergies, primarily due to pollen, and takes two Claritin in the morning and two at night. His eosinophil count was slightly elevated in his last blood test, which he attributes to his allergies.   He administers Humira  injections in his thigh, noting variability in sensation during administration.   He has a history of bone spurs in his right shoulder, causing occasional discomfort, especially at night depending on his sleeping position.   No recent illnesses aside from allergies and no leg swelling. He has seen a dermatologist for  biopsies, which were benign.        Previous HPI 08/08/2023 Steve Wang is a 53 y.o. male here for follow up  for sarcoidosis with cardiac and pulmonary involvement currently on Humira  40 mg subcu q. 14 days and tapering prednisone  currently at 1.25 mg daily.  He had labs checked this morning for monitoring with disease activity also checking a.m. cortisol level with history of long-term chronic steroid exposure.  No significant respiratory or circulatory symptoms most the time he still gets short of breath with exertion easier than usual and feels generally tired.  No cough or chest pain.  He has not had any known illness though with multiple sick contacts at home.  Has mild pain and stiffness in his hands with stiffness lasting up to 3 hours in the morning.  Not needing any extra medication for this specifically.     Previous HPI 05/08/2023 Steve Wang is a 53 y.o. male here for follow up for sarcoidosis with cardiac and pulmonary involvement currently on Humira  40 mg subcu q. 14 days and tapering prednisone  currently 7.5 mg daily.  Since her last visit he had updated PET CT scan with cardiac disease appears quiescent.  He just followed up with his [facility] clinic visit on the ninth agreement with tapering steroid plan.  He still feels a high level of fatigue throughout the day and has dyspnea on exertion. He exercises at home regularly.  He was recommended for a brain MRI to rule out neuro involvement due to persistent brain fog/cognitive difficulty. He took a course of doxycycline  for a tooth infection but no other serious infections.   Previous HPI 02/05/2023 Steve Wang is a 53 y.o. male here for follow up for sarcoidosis with cardiac and pulmonary involvement currently on Humira  40 mg subcu q. 14 days and prednisone  10 mg daily.  Since her last visit he thinks there is maybe slight improvement in his dyspnea on exertion but otherwise feels mostly the same.  He did break a toe on his right foot  striking on a chair leg accidentally.  He is experiencing some hand stiffness and burning type pain in the morning sometimes lasting until noon.  Not associated with any increased swelling or visible changes.  No new rashes and he has not been sick with anything requiring antibiotics.  He is scheduled for an updated PET CT scan for cardiac involvement tomorrow.   Previous HPI 10/31/22 Steve Wang is a 53 y.o. male here for follow up for sarcoidosis with cardiac and pulmonary involvement on prednisone  15 mg daily and recently started Humira  40 mg subcu q. 14 days with 3 doses taken so far.  He has not noticed any particular side effect or intolerance with taking the medication.  He continues having some shortness of breath with exertion he can make it about 2 flights of stairs before getting winded.  He followed up with Dr. Tamea about asthma/COPD inhaler treatment and pulmonary rehab. Other symptom has been extensive body aches in the mornings in the past month. This has partially improved since the onset but still has hours of morning stiffness, and pain lasting throughout the day. He thinks decreasing prednisone  from 20 to 15 mg daily may have actually improved this, and saw no benefit with additional ibuprofen .   Previous HPI 06/11/22 Steve Wang is a 53 y.o. male here for management of sarcoidosis. He was originally diagnosed by bronchoscopy and possibly biopsy (not available for review to me at this time) in 2012 with initial treatment course of methotrexate  and prednisone  for a year but was not on long term immunosuppression and without disease activity. More recently concern due to decreased in EF under close monitoring with heart failure clinic possibly thought reltaed to COVID-19 infection and inflammation. Cardiac MRI in 2022 unremarkable for inflammatory changes as was PET/CT surveillance in March. However he continued to developed symptomatic disease this improved on methotrexate  and prednisone   but had return of joint pains with prednisone  tapering. Repeat PET/CT obtained 11/15 now showing evidence of increased metabolic uptake in both ventricular wall as mediastinal lymph nodes concerning for active sarcoidosis.  His symptoms are not very noticeable at baseline he can climb about 3 flights of stairs without resting but would be winded by the end of it and recover after several minutes.  He has not having any cough or chest pain.  He has noticed episodic skin rash usually breaking out on his scalp in the center of the chest and seems to correspond with when he has more exertional dyspnea or mild coughing.  He has some joint pain in hands and feet in the morning she was improved after 5 to 10 minutes.  He does not see any visible swelling or erythema.  He has never had any eye inflammation or vision change during this time. Recently this year he was resumed on methotrexate  treatment but he did not tolerate this well felt a lot of  brain fog and general malaise symptoms.  He never noticed much difference in symptoms with the methotrexate  and so far is only seen a difference when prescribed steroids.  He stopped taking the methotrexate  since about 5 weeks ago due to the side effects and not seeing a difference.  He is currently on a prolonged prednisone  taper based on findings for active cardiac sarcoidosis currently 30 mg daily plan to taper down and maintain on 10 mg dose until February follow-up repeat scan.   Imaging reviewed 06/06/22 PET/CT Cardiac PET/CT metabolic study with F-18 FDG and scanned on PET [facility]: 1. There are several segments ( 7 out of 17) with hypermetabolic activity suggestive of an active inflammatory process in the left ventricular myocardium. 2. Approximately 44% of the left ventricle is involved with predominantly mild/moderate/severe hypermetabolic activity. 3. There is evidence of abnormal metabolism in the right ventricle. 4. Overall findings are consistent with  relapse of disease (Sarcoidosis).  Perfusion/Function.  Gated cardiac PET/CT rest myocardial perfusion study with [id number] demonstrates: 1. Normal myocardial perfusion. 2. Dilated left ventricle with normal left ventricular systolic function.  Non-diagnostic CT obtained for attenuation correction:  1. There are no discernible coronary artery calcifications. 2. There are extensive areas of extracardiac hypermetabolic activity noted on the limited field of view, specifically bilateral hilar, mediastinal lymph nodes, and extensive lung opacities, and retroperitoneal lymphnodes. Those findings were not present on most recent exam.   No Rheumatology ROS completed.   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 09/2018 Echo: EF 55-60%; b. 08/2020 Echo: EF 45-50%, grade 2 diastolic dysfunction; c. 10/2020 cMRI: EF 36%, mild to mod dil LV. Mod red RV fxn. No LGE/evidence of sarcoid. No signif valvular dzs   NICM (nonischemic cardiomyopathy) (HCC)    a. 09/2018 Echo: EF 55-60%; b. 08/2020 Echo: EF 45-50%; c. 10/2020 cMRI: EF 36%, mild to mod dil LV. Mod red RV fxn. No LGE/evidence  of sarcoid. No signif valvular dzs; d. 12/2020 Cor CTA: Ca2+= 0. Nl cors.   Pulmonary nodule    a. 12/2020 CT chest: 1.3cm posterior basal LUL nodule and 3mm LLL nodule - unchanged.   RBBB    Sarcoidosis    a. Dx 10/2020 in [place]  - prev on steroids/methotrexate .    Family History  Problem Relation Age of Onset   COPD Mother    Colon cancer Neg Hx    Pancreatic cancer Neg Hx    Liver cancer Neg Hx    Esophageal cancer Neg Hx    Stomach cancer Neg Hx    Past Surgical History:  Procedure Laterality Date   BRONCHOSCOPY  11/09/2010   [facility] [place]    COLONOSCOPY     UPPER GI ENDOSCOPY     Social History   Social History Narrative   Not on file   Immunization History  Administered Date(s) Administered   Influenza,inj,Quad PF,6+ Mos 09/08/2012   Influenza-Unspecified 05/07/2011   Pneumococcal Polysaccharide-23 06/25/2018   Tdap 03/24/2013, 06/25/2018     Objective: Vital Signs: There were no vitals taken for this visit.   Physical Exam   Musculoskeletal Exam: ***  CDAI Exam: CDAI Score: -- Patient Global: --; Provider Global: -- Swollen: --; Tender: -- Joint Exam 02/05/2024   No joint exam has been documented for this visit   There is currently no information documented on the homunculus. Go to the Rheumatology activity and complete the homunculus joint exam.  Investigation: No additional findings.  Imaging: No results found.  Recent Labs: Lab Results  Component Value Date   WBC 5.0 11/06/2023   HGB 15.6 11/06/2023   PLT 208 11/06/2023   NA 136 11/06/2023   K 4.7 11/06/2023   CL 103 11/06/2023   CO2 23 11/06/2023   GLUCOSE 89 11/06/2023   BUN 17 11/06/2023   CREATININE 0.88 11/06/2023   BILITOT 0.5 11/06/2023   ALKPHOS 52 08/08/2023   AST 21 11/06/2023   ALT 29 11/06/2023   PROT 7.5 11/06/2023   ALBUMIN 4.3 08/08/2023   CALCIUM  9.3 11/06/2023   GFRAA 116 06/25/2018   QFTBGOLDPLUS NEGATIVE 05/08/2023    Speciality Comments: Humira   started 09/26/22  Procedures:  No procedures performed Allergies: Patient has no known allergies.   Assessment / Plan:     Visit Diagnoses: No diagnosis found.  ***  Orders: No orders of the defined types were placed in this encounter.  No orders of the defined types were placed in this encounter.    Follow-Up Instructions: No follow-ups on file.   Shelba SHAUNNA Potters, RT  Note - This record has been created using [employer].  Chart creation errors have been sought, but may not always  have been located. Such creation errors do not reflect on  the standard of medical care.

## 2024-02-05 ENCOUNTER — Ambulatory Visit: Admitting: Internal Medicine

## 2024-02-05 DIAGNOSIS — Z79899 Other long term (current) drug therapy: Secondary | ICD-10-CM

## 2024-02-05 DIAGNOSIS — D8685 Sarcoid myocarditis: Secondary | ICD-10-CM

## 2024-02-05 DIAGNOSIS — M159 Polyosteoarthritis, unspecified: Secondary | ICD-10-CM

## 2024-02-20 ENCOUNTER — Other Ambulatory Visit: Payer: Self-pay | Admitting: Pulmonary Disease

## 2024-02-20 DIAGNOSIS — D869 Sarcoidosis, unspecified: Secondary | ICD-10-CM

## 2024-02-20 DIAGNOSIS — J449 Chronic obstructive pulmonary disease, unspecified: Secondary | ICD-10-CM

## 2024-02-20 DIAGNOSIS — R0602 Shortness of breath: Secondary | ICD-10-CM

## 2024-02-28 NOTE — Progress Notes (Deleted)
 Office Visit Note  Patient: Steve Wang             Date of Birth: 1970/09/01           MRN: [id number]             PCP: [name], MD Referring: [name], MD Visit Date: 03/12/2024   Subjective:  No chief complaint on file.   History of Present Illness: Steve Wang is a 53 y.o. male here for follow up for sarcoidosis with cardiac and pulmonary involvement currently on Humira  40 mg subcu q. 14 days     Previous HPI 11/06/2023 Steve Wang is a 53 y.o. male here for follow up for sarcoidosis with cardiac and pulmonary involvement currently on Humira  40 mg subcu q. 14 days     He is undergoing a repeat nuclear medicine scan to assess for any remaining heart muscle involvement. His previous PET scan showed no cardiac involvement, and he has been off prednisone  since December. He is currently on Humira , Jardiance , metoprolol , and spironolactone. He wants to reduce his medication regimen if possible, particularly wanting to remain on Humira  alone.   He experiences morning stiffness and joint pain affecting his wrists, ankles, and feet, which improves after one to two hours of activity. He takes ibuprofen  1200 mg once in the morning, though not daily, and is interested in trying an 800 mg prescription ibuprofen  for convenience. He dislikes taking multiple pills and prefers to manage his symptoms with minimal medication.   He experiences seasonal allergies, primarily due to pollen, and takes two Claritin in the morning and two at night. His eosinophil count was slightly elevated in his last blood test, which he attributes to his allergies.   He administers Humira  injections in his thigh, noting variability in sensation during administration.   He has a history of bone spurs in his right shoulder, causing occasional discomfort, especially at night depending on his sleeping position.   No recent illnesses aside from allergies and no leg swelling. He has seen a dermatologist for  biopsies, which were benign.        Previous HPI 08/08/2023 Steve Wang is a 53 y.o. male here for follow up  for sarcoidosis with cardiac and pulmonary involvement currently on Humira  40 mg subcu q. 14 days and tapering prednisone  currently at 1.25 mg daily.  He had labs checked this morning for monitoring with disease activity also checking a.m. cortisol level with history of long-term chronic steroid exposure.  No significant respiratory or circulatory symptoms most the time he still gets short of breath with exertion easier than usual and feels generally tired.  No cough or chest pain.  He has not had any known illness though with multiple sick contacts at home.  Has mild pain and stiffness in his hands with stiffness lasting up to 3 hours in the morning.  Not needing any extra medication for this specifically.     Previous HPI 05/08/2023 Steve Wang is a 53 y.o. male here for follow up for sarcoidosis with cardiac and pulmonary involvement currently on Humira  40 mg subcu q. 14 days and tapering prednisone  currently 7.5 mg daily.  Since her last visit he had updated PET CT scan with cardiac disease appears quiescent.  He just followed up with his [facility] clinic visit on the ninth agreement with tapering steroid plan.  He still feels a high level of fatigue throughout the day and has dyspnea on exertion. He exercises at home  regularly. He was recommended for a brain MRI to rule out neuro involvement due to persistent brain fog/cognitive difficulty. He took a course of doxycycline  for a tooth infection but no other serious infections.   Previous HPI 02/05/2023 Steve Wang is a 53 y.o. male here for follow up for sarcoidosis with cardiac and pulmonary involvement currently on Humira  40 mg subcu q. 14 days and prednisone  10 mg daily.  Since her last visit he thinks there is maybe slight improvement in his dyspnea on exertion but otherwise feels mostly the same.  He did break a toe on his right foot  striking on a chair leg accidentally.  He is experiencing some hand stiffness and burning type pain in the morning sometimes lasting until noon.  Not associated with any increased swelling or visible changes.  No new rashes and he has not been sick with anything requiring antibiotics.  He is scheduled for an updated PET CT scan for cardiac involvement tomorrow.   Previous HPI 10/31/22 Steve Wang is a 53 y.o. male here for follow up for sarcoidosis with cardiac and pulmonary involvement on prednisone  15 mg daily and recently started Humira  40 mg subcu q. 14 days with 3 doses taken so far.  He has not noticed any particular side effect or intolerance with taking the medication.  He continues having some shortness of breath with exertion he can make it about 2 flights of stairs before getting winded.  He followed up with Dr. Tamea about asthma/COPD inhaler treatment and pulmonary rehab. Other symptom has been extensive body aches in the mornings in the past month. This has partially improved since the onset but still has hours of morning stiffness, and pain lasting throughout the day. He thinks decreasing prednisone  from 20 to 15 mg daily may have actually improved this, and saw no benefit with additional ibuprofen .   Previous HPI 06/11/22 Steve Wang is a 53 y.o. male here for management of sarcoidosis. He was originally diagnosed by bronchoscopy and possibly biopsy (not available for review to me at this time) in 2012 with initial treatment course of methotrexate  and prednisone  for a year but was not on long term immunosuppression and without disease activity. More recently concern due to decreased in EF under close monitoring with heart failure clinic possibly thought reltaed to COVID-19 infection and inflammation. Cardiac MRI in 2022 unremarkable for inflammatory changes as was PET/CT surveillance in March. However he continued to developed symptomatic disease this improved on methotrexate  and prednisone   but had return of joint pains with prednisone  tapering. Repeat PET/CT obtained 11/15 now showing evidence of increased metabolic uptake in both ventricular wall as mediastinal lymph nodes concerning for active sarcoidosis.  His symptoms are not very noticeable at baseline he can climb about 3 flights of stairs without resting but would be winded by the end of it and recover after several minutes.  He has not having any cough or chest pain.  He has noticed episodic skin rash usually breaking out on his scalp in the center of the chest and seems to correspond with when he has more exertional dyspnea or mild coughing.  He has some joint pain in hands and feet in the morning she was improved after 5 to 10 minutes.  He does not see any visible swelling or erythema.  He has never had any eye inflammation or vision change during this time. Recently this year he was resumed on methotrexate  treatment but he did not tolerate this well felt a lot  of brain fog and general malaise symptoms.  He never noticed much difference in symptoms with the methotrexate  and so far is only seen a difference when prescribed steroids.  He stopped taking the methotrexate  since about 5 weeks ago due to the side effects and not seeing a difference.  He is currently on a prolonged prednisone  taper based on findings for active cardiac sarcoidosis currently 30 mg daily plan to taper down and maintain on 10 mg dose until February follow-up repeat scan.   Imaging reviewed 06/06/22 PET/CT Cardiac PET/CT metabolic study with F-18 FDG and scanned on PET [facility]: 1. There are several segments ( 7 out of 17) with hypermetabolic activity suggestive of an active inflammatory process in the left ventricular myocardium. 2. Approximately 44% of the left ventricle is involved with predominantly mild/moderate/severe hypermetabolic activity. 3. There is evidence of abnormal metabolism in the right ventricle. 4. Overall findings are consistent with  relapse of disease (Sarcoidosis).  Perfusion/Function.  Gated cardiac PET/CT rest myocardial perfusion study with [id number] demonstrates: 1. Normal myocardial perfusion. 2. Dilated left ventricle with normal left ventricular systolic function.  Non-diagnostic CT obtained for attenuation correction:  1. There are no discernible coronary artery calcifications. 2. There are extensive areas of extracardiac hypermetabolic activity noted on the limited field of view, specifically bilateral hilar, mediastinal lymph nodes, and extensive lung opacities, and retroperitoneal lymphnodes. Those findings were not present on most recent exam.  No Rheumatology ROS completed.   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 09/2018 Echo: EF 55-60%; b. 08/2020 Echo: EF 45-50%, grade 2 diastolic dysfunction; c. 10/2020 cMRI: EF 36%, mild to mod dil LV. Mod red RV fxn. No LGE/evidence of sarcoid. No signif valvular dzs   NICM (nonischemic cardiomyopathy) (HCC)    a. 09/2018 Echo: EF 55-60%; b. 08/2020 Echo: EF 45-50%; c. 10/2020 cMRI: EF 36%, mild to mod dil LV. Mod red RV fxn. No LGE/evidence of  sarcoid. No signif valvular dzs; d. 12/2020 Cor CTA: Ca2+= 0. Nl cors.   Pulmonary nodule    a. 12/2020 CT chest: 1.3cm posterior basal LUL nodule and 3mm LLL nodule - unchanged.   RBBB    Sarcoidosis    a. Dx 10/2020 in [place]  - prev on steroids/methotrexate .    Family History  Problem Relation Age of Onset   COPD Mother    Colon cancer Neg Hx    Pancreatic cancer Neg Hx    Liver cancer Neg Hx    Esophageal cancer Neg Hx    Stomach cancer Neg Hx    Past Surgical History:  Procedure Laterality Date   BRONCHOSCOPY  11/09/2010   [facility] [place]    COLONOSCOPY     UPPER GI ENDOSCOPY     Social History   Social History Narrative   Not on file   Immunization History  Administered Date(s) Administered   Influenza,inj,Quad PF,6+ Mos 09/08/2012   Influenza-Unspecified 05/07/2011   Pneumococcal Polysaccharide-23 06/25/2018   Tdap 03/24/2013, 06/25/2018     Objective: Vital Signs: There were no vitals taken for this visit.   Physical Exam   Musculoskeletal Exam: ***  CDAI Exam: CDAI Score: -- Patient Global: --; Provider Global: -- Swollen: --; Tender: -- Joint Exam 03/12/2024   No joint exam has been documented for this visit   There is currently no information documented on the homunculus. Go to the Rheumatology activity and complete the homunculus joint exam.  Investigation: No additional findings.  Imaging: No results found.  Recent Labs: Lab Results  Component Value Date   WBC 5.0 11/06/2023   HGB 15.6 11/06/2023   PLT 208 11/06/2023   NA 136 11/06/2023   K 4.7 11/06/2023   CL 103 11/06/2023   CO2 23 11/06/2023   GLUCOSE 89 11/06/2023   BUN 17 11/06/2023   CREATININE 0.88 11/06/2023   BILITOT 0.5 11/06/2023   ALKPHOS 52 08/08/2023   AST 21 11/06/2023   ALT 29 11/06/2023   PROT 7.5 11/06/2023   ALBUMIN 4.3 08/08/2023   CALCIUM  9.3 11/06/2023   GFRAA 116 06/25/2018   QFTBGOLDPLUS NEGATIVE 05/08/2023    Speciality Comments: Humira   started 09/26/22  Procedures:  No procedures performed Allergies: Patient has no known allergies.   Assessment / Plan:     Visit Diagnoses: No diagnosis found.  ***  Orders: No orders of the defined types were placed in this encounter.  No orders of the defined types were placed in this encounter.    Follow-Up Instructions: No follow-ups on file.   Lunetta Marina M Elizabeht Suto, CMA  Note - This record has been created using Animal nutritionist.  Chart creation errors have been sought, but may not always  have been located. Such creation errors do not reflect on  the standard of medical care.

## 2024-03-12 ENCOUNTER — Ambulatory Visit: Admitting: Internal Medicine

## 2024-03-12 DIAGNOSIS — D8685 Sarcoid myocarditis: Secondary | ICD-10-CM

## 2024-03-12 DIAGNOSIS — Z79899 Other long term (current) drug therapy: Secondary | ICD-10-CM

## 2024-03-12 DIAGNOSIS — M159 Polyosteoarthritis, unspecified: Secondary | ICD-10-CM

## 2024-04-07 ENCOUNTER — Other Ambulatory Visit: Payer: Self-pay | Admitting: *Deleted

## 2024-04-07 DIAGNOSIS — D860 Sarcoidosis of lung: Secondary | ICD-10-CM

## 2024-04-07 DIAGNOSIS — Z9225 Personal history of immunosupression therapy: Secondary | ICD-10-CM

## 2024-04-07 DIAGNOSIS — D8685 Sarcoid myocarditis: Secondary | ICD-10-CM

## 2024-04-07 DIAGNOSIS — Z79899 Other long term (current) drug therapy: Secondary | ICD-10-CM

## 2024-04-07 DIAGNOSIS — Z111 Encounter for screening for respiratory tuberculosis: Secondary | ICD-10-CM

## 2024-04-07 NOTE — Telephone Encounter (Signed)
 Refill request received via fax from [facility] for Humira   Last Fill: 01/13/2024  Labs: 11/06/2023 Eosinophils Absolute 525  TB Gold: 05/08/2023 Neg   Next Visit: 06/02/2024  Last Visit: 11/06/2023  IK:[id number] sarcoidosis   Current Dose per office note 11/06/2023: Humira  40 mg subcu q. 14 days   Patient advised he is due to update labs. Patient plans to update on Friday. Orders released.   Okay to refill Humira ?

## 2024-04-08 MED ORDER — ADALIMUMAB 40 MG/0.4ML ~~LOC~~ AJKT: 40.0000 mg | AUTO-INJECTOR | SUBCUTANEOUS | 0 refills | Status: DC

## 2024-05-03 ENCOUNTER — Other Ambulatory Visit: Payer: Self-pay | Admitting: Internal Medicine

## 2024-05-03 DIAGNOSIS — M159 Polyosteoarthritis, unspecified: Secondary | ICD-10-CM

## 2024-05-04 NOTE — Telephone Encounter (Signed)
 Last Fill: 11/06/2023  Next Visit: 06/02/2024  Last Visit: 11/06/2023  Dx: Cardiac sarcoidosis   Current Dose per office note on 11/06/2023: Dose not mentioned  Okay to refill Ibuprofen  800?

## 2024-05-07 ENCOUNTER — Ambulatory Visit
Admission: RE | Admit: 2024-05-07 | Discharge: 2024-05-07 | Disposition: A | Discharge status: Home / Self Care | Source: Ambulatory Visit | Attending: Pulmonary Disease | Admitting: Pulmonary Disease

## 2024-05-07 ENCOUNTER — Telehealth: Payer: Self-pay | Admitting: Internal Medicine

## 2024-05-07 DIAGNOSIS — Z79899 Other long term (current) drug therapy: Secondary | ICD-10-CM

## 2024-05-07 DIAGNOSIS — D869 Sarcoidosis, unspecified: Secondary | ICD-10-CM | POA: Insufficient documentation

## 2024-05-07 DIAGNOSIS — Z9225 Personal history of immunosupression therapy: Secondary | ICD-10-CM

## 2024-05-07 DIAGNOSIS — Z111 Encounter for screening for respiratory tuberculosis: Secondary | ICD-10-CM

## 2024-05-07 NOTE — Telephone Encounter (Signed)
 Lab Orders released.

## 2024-05-07 NOTE — Telephone Encounter (Signed)
 Patient contacted the office requesting lab orders to be be release to [facility]   Patient plans to have labs on today.

## 2024-05-09 ENCOUNTER — Ambulatory Visit: Payer: Self-pay | Admitting: Pulmonary Disease

## 2024-05-09 DIAGNOSIS — D869 Sarcoidosis, unspecified: Secondary | ICD-10-CM

## 2024-05-11 NOTE — Telephone Encounter (Signed)
 The PET scan will allow me to see the areas that are more active to biopsy.

## 2024-05-11 NOTE — Telephone Encounter (Signed)
 I spoke with the patient. He is okay with having the PET scan done. He is asking what you are looking for with the PET scan.

## 2024-05-12 LAB — QUANTIFERON-TB GOLD PLUS
QuantiFERON Mitogen Value: >10 [IU]/mL
QuantiFERON Nil Value: 0.1 [IU]/mL
QuantiFERON TB1 Ag Value: 0.1 [IU]/mL
QuantiFERON TB2 Ag Value: 0.1 [IU]/mL

## 2024-05-12 LAB — CMP14+EGFR
ALT: 30 IU/L (ref 0–44)
AST: 25 IU/L (ref 0–40)
Albumin: 4.4 g/dL (ref 3.8–4.9)
Alkaline Phosphatase: 68 IU/L (ref 47–123)
BUN/Creatinine Ratio: 24 — ABNORMAL HIGH (ref 9–20)
BUN: 23 mg/dL (ref 6–24)
Bilirubin Total: 0.3 mg/dL (ref 0.0–1.2)
CO2: 21 mmol/L (ref 20–29)
Calcium: 9.5 mg/dL (ref 8.7–10.2)
Chloride: 105 mmol/L (ref 96–106)
Creatinine, Ser: 0.97 mg/dL (ref 0.76–1.27)
Globulin, Total: 2.9 g/dL (ref 1.5–4.5)
Glucose: 90 mg/dL (ref 70–99)
Potassium: 4.7 mmol/L (ref 3.5–5.2)
Sodium: 142 mmol/L (ref 134–144)
Total Protein: 7.3 g/dL (ref 6.0–8.5)
eGFR: 94 mL/min/1.73 (ref >59)

## 2024-05-12 LAB — CBC WITH DIFFERENTIAL/PLATELET
Basophils Absolute: 0 x10E3/uL (ref 0.0–0.2)
Basos: 1 %
EOS (ABSOLUTE): 0.5 x10E3/uL — ABNORMAL HIGH (ref 0.0–0.4)
Eos: 11 %
Hematocrit: 47.5 % (ref 37.5–51.0)
Hemoglobin: 15.4 g/dL (ref 13.0–17.7)
Immature Grans (Abs): 0 x10E3/uL (ref 0.0–0.1)
Immature Granulocytes: 0 %
Lymphocytes Absolute: 1.2 x10E3/uL (ref 0.7–3.1)
Lymphs: 24 %
MCH: 30.4 pg (ref 26.6–33.0)
MCHC: 32.4 g/dL (ref 31.5–35.7)
MCV: 94 fL (ref 79–97)
Monocytes Absolute: 0.7 x10E3/uL (ref 0.1–0.9)
Monocytes: 15 %
Neutrophils Absolute: 2.5 x10E3/uL (ref 1.4–7.0)
Neutrophils: 49 %
Platelets: 204 x10E3/uL (ref 150–450)
RBC: 5.06 x10E6/uL (ref 4.14–5.80)
RDW: 12.7 % (ref 11.6–15.4)
WBC: 5 x10E3/uL (ref 3.4–10.8)

## 2024-05-12 NOTE — Telephone Encounter (Deleted)
-----   Message from Evalene VEAR Louder, [place] sent at 05/11/2024  5:04 PM EDT -----

## 2024-05-18 ENCOUNTER — Ambulatory Visit
Admission: RE | Admit: 2024-05-18 | Discharge: 2024-05-18 | Disposition: A | Discharge status: Home / Self Care | Source: Ambulatory Visit | Attending: Pulmonary Disease | Admitting: Pulmonary Disease

## 2024-05-18 DIAGNOSIS — D869 Sarcoidosis, unspecified: Secondary | ICD-10-CM | POA: Diagnosis present

## 2024-05-18 DIAGNOSIS — D860 Sarcoidosis of lung: Secondary | ICD-10-CM | POA: Insufficient documentation

## 2024-05-18 DIAGNOSIS — R590 Localized enlarged lymph nodes: Secondary | ICD-10-CM | POA: Diagnosis not present

## 2024-05-18 LAB — GLUCOSE, CAPILLARY: Glucose-Capillary: 78 mg/dL (ref 70–99)

## 2024-05-18 MED ORDER — FLUDEOXYGLUCOSE F - 18 (FDG) INJECTION
9.2000 | Freq: Once | INTRAVENOUS | Status: AC | PRN
Start: 2024-05-18 — End: 2024-05-18
  Administered 2024-05-18: 9.2 via INTRAVENOUS

## 2024-05-19 ENCOUNTER — Ambulatory Visit: Payer: Self-pay | Admitting: Pulmonary Disease

## 2024-05-20 ENCOUNTER — Other Ambulatory Visit: Payer: Self-pay

## 2024-05-20 DIAGNOSIS — D8685 Sarcoid myocarditis: Secondary | ICD-10-CM

## 2024-05-20 DIAGNOSIS — D860 Sarcoidosis of lung: Secondary | ICD-10-CM

## 2024-05-20 NOTE — Progress Notes (Deleted)
 Office Visit Note  Patient: Steve Wang             Date of Birth: 1971-03-02           MRN: [id number]             PCP: [name], MD Referring: [name], MD Visit Date: 06/02/2024   Subjective:  No chief complaint on file.   History of Present Illness: Steve Wang is a 53 y.o. male here for follow up for sarcoidosis with cardiac and pulmonary involvement currently on Humira  40 mg subcu q. 14 days     Previous HPI 11/06/2023 Steve Wang is a 53 y.o. male here for follow up for sarcoidosis with cardiac and pulmonary involvement currently on Humira  40 mg subcu q. 14 days     He is undergoing a repeat nuclear medicine scan to assess for any remaining heart muscle involvement. His previous PET scan showed no cardiac involvement, and he has been off prednisone  since December. He is currently on Humira , Jardiance , metoprolol , and spironolactone. He wants to reduce his medication regimen if possible, particularly wanting to remain on Humira  alone.   He experiences morning stiffness and joint pain affecting his wrists, ankles, and feet, which improves after one to two hours of activity. He takes ibuprofen  1200 mg once in the morning, though not daily, and is interested in trying an 800 mg prescription ibuprofen  for convenience. He dislikes taking multiple pills and prefers to manage his symptoms with minimal medication.   He experiences seasonal allergies, primarily due to pollen, and takes two Claritin in the morning and two at night. His eosinophil count was slightly elevated in his last blood test, which he attributes to his allergies.   He administers Humira  injections in his thigh, noting variability in sensation during administration.   He has a history of bone spurs in his right shoulder, causing occasional discomfort, especially at night depending on his sleeping position.   No recent illnesses aside from allergies and no leg swelling. He has seen a dermatologist for  biopsies, which were benign.        Previous HPI 08/08/2023 Steve Wang is a 53 y.o. male here for follow up  for sarcoidosis with cardiac and pulmonary involvement currently on Humira  40 mg subcu q. 14 days and tapering prednisone  currently at 1.25 mg daily.  He had labs checked this morning for monitoring with disease activity also checking a.m. cortisol level with history of long-term chronic steroid exposure.  No significant respiratory or circulatory symptoms most the time he still gets short of breath with exertion easier than usual and feels generally tired.  No cough or chest pain.  He has not had any known illness though with multiple sick contacts at home.  Has mild pain and stiffness in his hands with stiffness lasting up to 3 hours in the morning.  Not needing any extra medication for this specifically.     Previous HPI 05/08/2023 Steve Wang is a 53 y.o. male here for follow up for sarcoidosis with cardiac and pulmonary involvement currently on Humira  40 mg subcu q. 14 days and tapering prednisone  currently 7.5 mg daily.  Since her last visit he had updated PET CT scan with cardiac disease appears quiescent.  He just followed up with his [facility] clinic visit on the ninth agreement with tapering steroid plan.  He still feels a high level of fatigue throughout the day and has dyspnea on exertion. He exercises at home  regularly. He was recommended for a brain MRI to rule out neuro involvement due to persistent brain fog/cognitive difficulty. He took a course of doxycycline  for a tooth infection but no other serious infections.   Previous HPI 02/05/2023 Steve Wang is a 53 y.o. male here for follow up for sarcoidosis with cardiac and pulmonary involvement currently on Humira  40 mg subcu q. 14 days and prednisone  10 mg daily.  Since her last visit he thinks there is maybe slight improvement in his dyspnea on exertion but otherwise feels mostly the same.  He did break a toe on his right foot  striking on a chair leg accidentally.  He is experiencing some hand stiffness and burning type pain in the morning sometimes lasting until noon.  Not associated with any increased swelling or visible changes.  No new rashes and he has not been sick with anything requiring antibiotics.  He is scheduled for an updated PET CT scan for cardiac involvement tomorrow.   Previous HPI 10/31/22 Steve Wang is a 53 y.o. male here for follow up for sarcoidosis with cardiac and pulmonary involvement on prednisone  15 mg daily and recently started Humira  40 mg subcu q. 14 days with 3 doses taken so far.  He has not noticed any particular side effect or intolerance with taking the medication.  He continues having some shortness of breath with exertion he can make it about 2 flights of stairs before getting winded.  He followed up with Dr. Tamea about asthma/COPD inhaler treatment and pulmonary rehab. Other symptom has been extensive body aches in the mornings in the past month. This has partially improved since the onset but still has hours of morning stiffness, and pain lasting throughout the day. He thinks decreasing prednisone  from 20 to 15 mg daily may have actually improved this, and saw no benefit with additional ibuprofen .   Previous HPI 06/11/22 Steve Wang is a 53 y.o. male here for management of sarcoidosis. He was originally diagnosed by bronchoscopy and possibly biopsy (not available for review to me at this time) in 2012 with initial treatment course of methotrexate  and prednisone  for a year but was not on long term immunosuppression and without disease activity. More recently concern due to decreased in EF under close monitoring with heart failure clinic possibly thought reltaed to COVID-19 infection and inflammation. Cardiac MRI in 2022 unremarkable for inflammatory changes as was PET/CT surveillance in March. However he continued to developed symptomatic disease this improved on methotrexate  and prednisone   but had return of joint pains with prednisone  tapering. Repeat PET/CT obtained 11/15 now showing evidence of increased metabolic uptake in both ventricular wall as mediastinal lymph nodes concerning for active sarcoidosis.  His symptoms are not very noticeable at baseline he can climb about 3 flights of stairs without resting but would be winded by the end of it and recover after several minutes.  He has not having any cough or chest pain.  He has noticed episodic skin rash usually breaking out on his scalp in the center of the chest and seems to correspond with when he has more exertional dyspnea or mild coughing.  He has some joint pain in hands and feet in the morning she was improved after 5 to 10 minutes.  He does not see any visible swelling or erythema.  He has never had any eye inflammation or vision change during this time. Recently this year he was resumed on methotrexate  treatment but he did not tolerate this well felt a lot  of brain fog and general malaise symptoms.  He never noticed much difference in symptoms with the methotrexate  and so far is only seen a difference when prescribed steroids.  He stopped taking the methotrexate  since about 5 weeks ago due to the side effects and not seeing a difference.  He is currently on a prolonged prednisone  taper based on findings for active cardiac sarcoidosis currently 30 mg daily plan to taper down and maintain on 10 mg dose until February follow-up repeat scan.   Imaging reviewed 06/06/22 PET/CT Cardiac PET/CT metabolic study with F-18 FDG and scanned on PET [facility]: 1. There are several segments ( 7 out of 17) with hypermetabolic activity suggestive of an active inflammatory process in the left ventricular myocardium. 2. Approximately 44% of the left ventricle is involved with predominantly mild/moderate/severe hypermetabolic activity. 3. There is evidence of abnormal metabolism in the right ventricle. 4. Overall findings are consistent with  relapse of disease (Sarcoidosis).  Perfusion/Function.  Gated cardiac PET/CT rest myocardial perfusion study with [id number] demonstrates: 1. Normal myocardial perfusion. 2. Dilated left ventricle with normal left ventricular systolic function.  Non-diagnostic CT obtained for attenuation correction:  1. There are no discernible coronary artery calcifications. 2. There are extensive areas of extracardiac hypermetabolic activity noted on the limited field of view, specifically bilateral hilar, mediastinal lymph nodes, and extensive lung opacities, and retroperitoneal lymphnodes. Those findings were not present on most recent exam.   No Rheumatology ROS completed.   PMFS History:  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   Stage 2 moderate COPD by GOLD classification (HCC) 11/06/2021   Former smoker 11/06/2021   Shortness of breath 11/06/2021   Right bundle branch block 09/10/2018   GERD (gastroesophageal reflux disease) 06/25/2018   Sarcoidosis of lung 06/25/2018   Chronic right shoulder pain 06/25/2018   History of migraine 06/25/2018   History of kidney stones 06/25/2018    Past Medical History:  Diagnosis Date   COPD (chronic obstructive pulmonary disease) (HCC)    Emphysema of lung (HCC)    GERD (gastroesophageal reflux disease)    HFrEF (heart failure with reduced ejection fraction) (HCC)    a. 09/2018 Echo: EF 55-60%; b. 08/2020 Echo: EF 45-50%, grade 2 diastolic dysfunction; c. 10/2020 cMRI: EF 36%, mild to mod dil LV. Mod red RV fxn. No LGE/evidence of sarcoid. No signif valvular dzs   NICM (nonischemic cardiomyopathy) (HCC)    a. 09/2018 Echo: EF 55-60%; b. 08/2020 Echo: EF 45-50%; c. 10/2020 cMRI: EF 36%, mild to mod dil LV. Mod red RV fxn. No LGE/evidence of sarcoid.  No signif valvular dzs; d. 12/2020 Cor CTA: Ca2+= 0. Nl cors.   Pulmonary nodule    a. 12/2020 CT chest: 1.3cm posterior basal LUL nodule and 3mm LLL nodule - unchanged.   RBBB    Sarcoidosis    a. Dx 10/2020 in [place]  - prev on steroids/methotrexate .    Family History  Problem Relation Age of Onset   COPD Mother    Colon cancer Neg Hx    Pancreatic cancer Neg Hx    Liver cancer Neg Hx    Esophageal cancer Neg Hx    Stomach cancer Neg Hx    Past Surgical History:  Procedure Laterality Date   BRONCHOSCOPY  11/09/2010   [facility]  [place]    COLONOSCOPY     UPPER GI ENDOSCOPY     Social History   Social History Narrative   Not on file   Immunization History  Administered Date(s) Administered   Influenza,inj,Quad PF,6+ Mos 09/08/2012   Influenza-Unspecified 05/07/2011   Pneumococcal Polysaccharide-23 06/25/2018   Tdap 03/24/2013, 06/25/2018     Objective: Vital Signs: There were no vitals taken for this visit.   Physical Exam   Musculoskeletal Exam: ***  CDAI Exam: CDAI Score: -- Patient Global: --; Provider Global: -- Swollen: --; Tender: -- Joint Exam 06/02/2024   No joint exam has been documented for this visit   There is currently no information documented on the homunculus. Go to the Rheumatology activity and complete the homunculus joint exam.  Investigation: No additional findings.  Imaging: NM PET Image Restage (PS) Whole Body (F-18 FDG) Result Date: 05/19/2024 EXAM: PET AND CT SKULL BASE TO MID THIGH 05/18/2024 09:30:50 AM TECHNIQUE: RADIOPHARMACEUTICAL: 9.2 mCi F-18 FDG Uptake time 60 minutes. Glucose level 78 mg/dl. PET imaging was acquired from the base of the skull to the mid thighs. Non-contrast enhanced computed tomography was obtained for attenuation correction and anatomic localization. COMPARISON: CT 05/07/2024. CLINICAL HISTORY: Sarcoidosis with new activity. 9.2 mCi F18 FDG Given left AC; Blood Glucose: 78 mg/dl; LUNG SARCOIDOSIS; NPO; NOT  DIABETIC; NO RECENT SURGERY/BIOPSY; NOT TAKING ANTIBIOTICS/STEROIDS; NO HX OF CHEMO/RADIATION THERAPY. FINDINGS: HEAD AND NECK: No metabolically active cervical lymphadenopathy. CHEST: There is intense hypermetabolic activity associated with the perihilar bronchovascular thickening in the left and right upper lobes. Hypermetabolic mildly enlarged subcarinal lymph nodes with a maximum SUV of 6.1, similar to the parenchymal hypermetabolic activity. Small hypermetabolic paratracheal nodes. ABDOMEN AND PELVIS: No metabolically active intraperitoneal mass. No abnormal metabolic activity within the spleen. No hypermetabolic abdominal or pelvic lymph nodes. Physiologic activity within the gastrointestinal and genitourinary systems. BONES AND SOFT TISSUE: No abnormal FDG activity localizes to the bones. No metabolically active aggressive osseous lesion. No evidence of active sarcoidosis outside of the thorax. IMPRESSION: 1. Hypermetabolic perihilar peribronchovascular parenchymal thickening and hypermetabolic mediastinal lymphadenopathy, consistent with pulmonary sarcoidosis and mediastinal lymph node sarcoidosis. 2. No evidence of active sarcoidosis outside of the thorax. Electronically signed by: Norleen Boxer MD 05/19/2024 10:21 AM EDT RP Workstation: [id number]   CT Chest High Resolution Result Date: 05/08/2024 CLINICAL DATA:  Sarcoid. EXAM: CT CHEST WITHOUT CONTRAST TECHNIQUE: Multidetector CT imaging of the chest was performed following the standard protocol without intravenous contrast. High resolution imaging of the lungs, as well as inspiratory and expiratory imaging, was performed. RADIATION DOSE REDUCTION: This exam was performed according to the departmental dose-optimization program which includes automated exposure control, adjustment of the mA and/or kV according to patient size and/or use of iterative reconstruction technique. COMPARISON:  08/14/2021. FINDINGS: Cardiovascular: Heart is at the upper limits  of normal in size. No pericardial effusion. Enlarged pulmonic trunk. Mediastinum/Nodes: Mediastinal lymph nodes have increased somewhat in size in the interval. Index subcarinal lymph node measures 1.7 cm, previously 9 mm. Hilar regions are difficult to definitively evaluate without IV contrast. No axillary adenopathy. Esophagus is grossly unremarkable. Lungs/Pleura: Interval progression and perihilar predominant parenchymal retraction, architectural distortion, micronodularity and traction bronchiectasis. No pleural fluid. Airway is unremarkable. No air trapping. Upper Abdomen: Visualized portions of the liver, gallbladder, adrenal glands, kidneys, spleen, pancreas, stomach and bowel are grossly unremarkable. No upper abdominal adenopathy. Musculoskeletal: Degenerative changes in the spine. IMPRESSION: 1. Progressive mediastinal and pulmonary parenchymal sarcoid. 2. Enlarged pulmonic trunk, indicative of pulmonary  arterial hypertension. Electronically Signed   By: Newell Eke M.D.   On: 05/08/2024 11:46    Recent Labs: Lab Results  Component Value Date   WBC 5.0 05/07/2024   HGB 15.4 05/07/2024   PLT 204 05/07/2024   NA 142 05/07/2024   K 4.7 05/07/2024   CL 105 05/07/2024   CO2 21 05/07/2024   GLUCOSE 90 05/07/2024   BUN 23 05/07/2024   CREATININE 0.97 05/07/2024   BILITOT 0.3 05/07/2024   ALKPHOS 68 05/07/2024   AST 25 05/07/2024   ALT 30 05/07/2024   PROT 7.3 05/07/2024   ALBUMIN 4.4 05/07/2024   CALCIUM  9.5 05/07/2024   GFRAA 116 06/25/2018   QFTBGOLDPLUS Negative 05/07/2024    Speciality Comments: Humira  started 09/26/22  Procedures:  No procedures performed Allergies: Patient has no known allergies.   Assessment / Plan:     Visit Diagnoses: No diagnosis found.  ***  Orders: No orders of the defined types were placed in this encounter.  No orders of the defined types were placed in this encounter.    Follow-Up Instructions: No follow-ups on file.   Donicia Druck M  Claramae Rigdon, CMA  Note - This record has been created using Animal nutritionist.  Chart creation errors have been sought, but may not always  have been located. Such creation errors do not reflect on  the standard of medical care.

## 2024-05-20 NOTE — Telephone Encounter (Signed)
 Last Fill: 04/08/2024 (30 day)  Labs: 05/07/2024 BUN 24 EOS abs 0.5 Rest of CBC and CMP WNL  TB Gold: 05/07/2024 TB Gold Negative   Next Visit: 08/03/2023  Last Visit: 11/06/2023  DX: Cardiac sarcoidosis   Current Dose per office note 11/06/2023: Humira  40 mg subcu q. 14 days   Okay to refill Humira ?

## 2024-05-21 ENCOUNTER — Encounter: Payer: Self-pay | Admitting: Pulmonary Disease

## 2024-05-21 ENCOUNTER — Ambulatory Visit

## 2024-05-21 ENCOUNTER — Ambulatory Visit: Admitting: Pulmonary Disease

## 2024-05-21 VITALS — BP 110/82 | HR 68 | Temp 98.1°F | Ht 66.0 in | Wt 166.0 lb

## 2024-05-21 DIAGNOSIS — R0602 Shortness of breath: Secondary | ICD-10-CM

## 2024-05-21 DIAGNOSIS — D869 Sarcoidosis, unspecified: Secondary | ICD-10-CM | POA: Diagnosis not present

## 2024-05-21 DIAGNOSIS — J449 Chronic obstructive pulmonary disease, unspecified: Secondary | ICD-10-CM

## 2024-05-21 LAB — PULMONARY FUNCTION TEST
DL/VA % pred: 97 %
DL/VA: 4.34 ml/min/mmHg/L
DLCO cor % pred: 81 %
DLCO cor: 20.74 ml/min/mmHg
DLCO unc % pred: 83 %
DLCO unc: 21.2 ml/min/mmHg
FEF 25-75 Post: 1.17 L/s
FEF 25-75 Pre: 0.83 L/s
FEF2575-%Change-Post: 41 %
FEF2575-%Pred-Post: 38 %
FEF2575-%Pred-Pre: 27 %
FEV1-%Change-Post: 14 %
FEV1-%Pred-Post: 61 %
FEV1-%Pred-Pre: 53 %
FEV1-Post: 2.06 L
FEV1-Pre: 1.8 L
FEV1FVC-%Change-Post: 15 %
FEV1FVC-%Pred-Pre: 65 %
FEV6-%Change-Post: 0 %
FEV6-%Pred-Post: 83 %
FEV6-%Pred-Pre: 84 %
FEV6-Post: 3.51 L
FEV6-Pre: 3.52 L
FEV6FVC-%Change-Post: 0 %
FEV6FVC-%Pred-Post: 102 %
FEV6FVC-%Pred-Pre: 102 %
FVC-%Change-Post: 0 %
FVC-%Pred-Post: 81 %
FVC-%Pred-Pre: 82 %
FVC-Post: 3.54 L
FVC-Pre: 3.58 L
Post FEV1/FVC ratio: 58 %
Post FEV6/FVC ratio: 99 %
Pre FEV1/FVC ratio: 50 %
Pre FEV6/FVC Ratio: 98 %
RV % pred: 107 %
RV: 1.99 L
TLC % pred: 93 %
TLC: 5.76 L

## 2024-05-21 MED ORDER — PREDNISONE 10 MG PO TABS: 20.0000 mg | ORAL_TABLET | Freq: Every day | ORAL | 1 refills | Status: DC

## 2024-05-21 NOTE — Patient Instructions (Signed)
 Full PFT completed today ? ?

## 2024-05-21 NOTE — Telephone Encounter (Signed)
 Contacted patient to advise what Dr. Jeannetta had to say about the Humira , patient states he has already talked to his pulmonologist about it an was aware of the request to stop the humira .

## 2024-05-21 NOTE — Patient Instructions (Signed)
 VISIT SUMMARY:  During your visit, we discussed your worsening shortness of breath and reviewed recent imaging results. The scans showed increased activity in your lungs and a growth in the size of a spot, indicating active sarcoidosis. We also noted that your emphysema has not worsened. We discussed your current treatment and made some adjustments to better manage your symptoms.  YOUR PLAN:  -PULMONARY SARCOIDOSIS WITH ACTIVE LUNG INVOLVEMENT: Pulmonary sarcoidosis is an inflammatory disease that affects the lungs. Your recent PET scan shows increased activity, indicating that the disease is active. We have decided to stop your current medication, Humira , as it may be causing a flare-up in your lungs. Instead, we have prescribed prednisone  20 mg daily to help reduce inflammation. We are also referring you to your rheumatologist to discuss starting a new medication, Remicade, and will consider Arava if Remicade is not suitable. We will follow up with you in 3-4 weeks to monitor your progress.  -CHRONIC OBSTRUCTIVE PULMONARY DISEASE (COPD): COPD is a chronic lung disease that makes it hard to breathe. Your recent imaging shows that your emphysema, a type of COPD, has not worsened. However, your lung function has slightly decreased, likely due to the sarcoidosis flare-up. We will continue to monitor your condition and adjust your treatment as needed.  INSTRUCTIONS:  Please follow up with your rheumatologist, Dr. Jeannetta, on the eleventh to discuss starting Remicade. We will see you again in 3-4 weeks to check on your progress. If you experience any new or worsening symptoms, please contact our office immediately.

## 2024-05-21 NOTE — Progress Notes (Signed)
 Subjective:    Patient ID: Steve Wang, male    DOB: Dec 11, 1970, 53 y.o.   MRN: [id number]  Patient Care Team: Steve Nancyann BRAVO, MD as PCP - General (Family Medicine) Steve Evalene PARAS, MD as PCP - Cardiology (Cardiology) Steve Dedra CROME, MD as Consulting Physician (Pulmonary Disease)  Chief Complaint  Patient presents with   COPD    Cough, shortness of breath and wheezing.     BACKGROUND/INTERVAL: Steve Wang is a 53 year old very complex former smoker with 25-pack-year history of smoking and a history of sarcoidosis first diagnosed in 2012 at the [facility] [place]  by lung biopsy. 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  He has been started by [facility] rheumatology in March 2024 on Humira  for management of his cardiac sarcoid as he has continued to show activity on methotrexate  and prednisone .  At that time his pulmonary sarcoid was quiescent.  He follows with cardiac PET/CTs at [facility].  He also follows with cardiology through [facility]. The patient was last seen here on 21 January 2024.  He also has a COPD component and is on Stiolto for the same.  He discontinued Ohtuvayre  due to feeling it did not help him.  HPI Discussed the use of AI scribe software for clinical note transcription with the patient, who gave verbal consent to proceed.  History of Present Illness   Steve Wang is a 53 year old male with sarcoidosis and stage two COPD who presents with worsening shortness of breath. He is accompanied by his wife, Steve Wang.  He has been experiencing worsening shortness of breath, which he describes as 'bad' and notes it has significantly worsened recently.  A recent high-resolution CT scan and PET scan showed increased activity in the lungs. There is a noted increase in the size of a lymph node from 9 mm to 1.7 cm which is consistent with sarcoidosis activity. The scans also revealed some emphysema, although it has not  worsened.  He has an upcoming appointment with his rheumatologist, Steve Wang, on November eleventh. His wife, Steve Wang, mentioned that he has stayed with Steve Wang because he was able to get approval for Humira .  The patient is currently on Humira  and there are reports in the literature of Humira  being effective for cardiac sarcoidosis however there can be a paradoxical flare of sarcoidosis in the lung at this agent.  He appears to have this paradoxical reaction.  QuantiFERON gold performed on October 16 was negative.  We discussed that it would be prudent to give a brief hold of Humira  and a short course of steroids and consider switching to infliximab (Remicade) which seems to have less of a paradoxical effect in the lung and actually is more effective against pulmonary sarcoid.  No new cardiac symptoms reported.  He has cardiology follow-up and echocardiogram scheduled in the next few weeks.     DATA: 10/13/2010 CT chest: Report only, performed at [facility] [place] : Upper lung predominant parenchymal changes, scarring and traction bronchiectasis with small peribronchial and perifissural nodules.  Enlarged bilateral hilar and mediastinal lymph nodes, findings all suggestive of sarcoidosis. 11/03/2010: ACE level 98, eosinophils elevated 8.1% 11/09/2010 Bronchoscopy specimens: [facility] , noncaseating granulomas right upper lobe, fungal culture negative for nocardia, Doratomyces species isolated, contaminant.  BAL 63% histiocytes, 33% lymphocytes, 2% neutrophils, 2% eosinophils.  Flow cytometry favored sarcoidosis. 09/21/2013 spirometry: Performed at [facility] [place] , FEV1 2.72 L or 76% predicted, FVC 4.42 L  or 95% predicted, FEV1/FVC 61%, DLCO moderately reduced.  Consistent with mild obstructive defect (not congruent with sarcoidosis). 09/22/2018 2D echo: Performed in [facility], [place], LVEF 55 to 60% mild LV dilatation, no wall motion abnormalities, otherwise normal. 08/11/2020  angiotensin-converting enzyme: 61 (reference range 14 to 82 units/L) 08/30/2020 CT chest with contrast: Perihilar traction bronchiectasis, parenchymal retraction and architectural distortion consistent with history of sarcoid.  Left ventricular dilatation.  3 mm left lower lobe nodule. 08/30/2020 echocardiogram: LVEF 45 to 50%, LV with mildly decreased function, global hypokinesis, LV cavity size severely dilated, grade II diastolic dysfunction. 10/24/2020 cardiac morphology MR: Mild to moderate dilated left ventricle, normal LV thickness, global hypokinesis, no regional wall motion abnormalities.  Reduced RV and LV systolic function LVEF 36%.  No evidence for infiltrative disease.  Negative for cardiac sarcoidosis. 12/15/2020 CT coronary morphology: Coronary calcium  score of 0, no evidence of CAD.  Lung windows findings as previous. 07/06/2021 myocardial PET/CT [facility]): Abnormal perfusion of the myocardium, normal wall motion and normal thickening.  No coronary artery calcifications, evidence of extracardiac areas in the chest of hypermetabolism suggest sarcoidosis.  Focal increased FDG uptake in the distal esophagus query esophagitis. 08/14/2021 CT chest with contrast: Stable exam from prior, no progressive findings, architectural distortion and scarring bilaterally compatible with prior history of sarcoidosis.  Stable 3 mm left lower lobe pulmonary nodule.  Pulmonary emphysema. 10/05/2021 PFTs: FEV1 2.21 L or 64% predicted, FVC 3.86 L or 87% predicted, FEV1/FVC 57%, low normal lung volumes, diffusion capacity normal.  Consistent with moderate obstruction.   10/19/2021 PET CT myocardial [facility]): Normal myocardial perfusion, normal left ventricular systolic function, wall motion and thickening, dilated left ventricle.  No areas of extracardiac hypermetabolic activity. 03/19/2022 2D echo: LVEF 40 to 45%, left ventricle demonstrates global hypokinesis. LVEF slightly worse than 08/2020  echocardiogram. 06/06/2022 PET/CT myocardial [facility]): 1. There are several segments ( 7 out of 17) with hypermetabolic activity suggestive of an active inflammatory process in the left ventricular myocardium. 2. Approximately 44% of the left ventricle is involved with predominantly mild/moderate/severe hypermetabolic activity. 3. There is evidence of abnormal metabolism in the right ventricle.  4. Overall findings are consistent with relapse of disease (Sarcoidosis).  06/11/2022 QuantiFERON gold: NEGATIVE 09/05/2022 PET/CT myocardial ([facility]):1. There are several segments with hypermetabolic activity suggestive of an active inflammatory process in the left ventricular myocardium. Previously 7 out of 17 segments and now 9 out of 17 segments. 2. Approximately 58% of the left ventricle is involved with predominantly mild/moderate/severe hypermetabolic activity. 3. There is evidence of mild abnormal metabolism in the right ventricle.  10/11/2022 cardiopulmonary stress test: Spirometry pretest FEV1 2.19 L or 64% predicted, FVC 4.80 L or 94% predicted, FEV1/FVC 54%.  MVV 94 (67%) overall patient gave very good effort.  Pulsoxymeter remained 99 to 100% for the duration of the exercise.  The exercise was performed on a cycle ergometer.  The spirometry does not show significant change from 2023 spirometric values.  The test overall showed low normal functional capacity when compared to much sedentary norms.  Patient appears predominantly pulmonary limited with mixed restrictive/obstructive lung physiology. 05/31/2023 PFTs: FEV1 1.72 L or 50% predicted FVC 3.60 L or 82% predicted FEV1/FVC 48% lung volumes normal with air trapping noted by RV.  Diffusion capacity normal.  Findings consistent with severe obstructive lung disease. 06/06/2023 MRI brain: No acute intracranial abnormalities.  No CNS manifestation of sarcoidosis. 11/20/2023 myocardial PET/CT [facility]): Normal myocardial perfusion.  Mildly reduced left ventricular  systolic function.  No discernible  coronary artery calcifications.  FDG avid hilar and mediastinal nodes likely inflammatory.  Hypermetabolic perihilar scarring slightly increased from prior.  Increased uptake corresponding to facet arthritis at T7-T8. 05/07/2024 CT chest high-resolution: Progressive mediastinal and pulmonary parenchymal sarcoid. 05/07/2024 QuantiFERON TB Gold: Negative. 05/07/2024 eosinophils: 500 cells per microliter 05/21/2024 PFTs: FEV1 1.80 L or 53% predicted, FVC 3.58 L or 82% predicted, FEV1/FVC 50%, there is significant bronchodilator response in on FEV1.  Lung volumes are normal.  Diffusion capacity is normal.  Compared to prior study performed 31 May 2023 there has been no appreciable change.  Review of Systems A 10 point review of systems was performed and it is as noted above otherwise negative.   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   Stage 2 moderate COPD by GOLD classification (HCC) 11/06/2021   Former smoker 11/06/2021   Shortness of breath 11/06/2021   Right bundle branch block 09/10/2018   GERD (gastroesophageal reflux disease) 06/25/2018   Sarcoidosis of lung 06/25/2018   Chronic right shoulder pain 06/25/2018   History of migraine 06/25/2018   History of kidney stones 06/25/2018    Social History   Tobacco Use   Smoking status: Former    Current packs/day: 0.00    Average packs/day: 1 pack/day for 25.0 years (25.0 ttl pk-yrs)    Types: Cigarettes    Start date: 07/24/1983    Quit date: 07/23/2008    Years since quitting: 15.8    Passive exposure: Never   Smokeless tobacco: Former    Types: Chew    Quit date: 07/24/2020  Substance Use Topics   Alcohol use: Not Currently    No Known Allergies  Current Meds  Medication Sig    adalimumab  (HUMIRA ) 40 MG/0.4ML pen Inject 0.4 mLs (40 mg total) into the skin every 14 (fourteen) days.   empagliflozin  (JARDIANCE ) 10 MG TABS tablet Take 1 tablet (10 mg total) by mouth daily before breakfast.   ibuprofen  (ADVIL ) 800 MG tablet TAKE 1 TABLET (800 MG TOTAL) BY MOUTH DAILY AS NEEDED.   metoprolol  succinate (TOPROL -XL) 25 MG 24 hr tablet TAKE 1/2 TABLET BY MOUTH EVERY DAY   Tiotropium Bromide-Olodaterol (STIOLTO RESPIMAT ) 2.5-2.5 MCG/ACT AERS INHALE 2 PUFFS BY MOUTH INTO THE LUNGS DAILY    Immunization History  Administered Date(s) Administered   Influenza,inj,Quad PF,6+ Mos 09/08/2012   Influenza-Unspecified 05/07/2011   Pneumococcal Polysaccharide-23 06/25/2018   Tdap 03/24/2013, 06/25/2018        Objective:     BP 110/82   Pulse 68   Temp 98.1 F (36.7 C) (Temporal)   Ht 5' 6 (1.676 m)   Wt 166 lb (75.3 kg)   SpO2 95%   BMI 26.79 kg/m   SpO2: 95 %  GENERAL: Well-developed, well-nourished gentleman, no acute distress, fully ambulatory, no conversational dyspnea. HEAD: Normocephalic, atraumatic.  EYES: Pupils equal, round, reactive to light.  No scleral icterus.  MOUTH: Natural dentition, moist.  No thrush. NECK: Supple. No thyromegaly. Trachea midline. No JVD.  No adenopathy. PULMONARY: Good air entry bilaterally. No adventitious sounds.  CARDIOVASCULAR: S1 and S2. Regular rate and rhythm.  No rubs, murmurs or gallops heard. ABDOMEN: Benign. MUSCULOSKELETAL: No joint deformity, no clubbing, no edema.  NEUROLOGIC: No overt focal deficit, no gait disturbance noted.  Speech is fluent. SKIN: Intact,warm,dry.  On  limited exam no rashes. PSYCH: Mood and behavior normal.  Recent Results (from the past 2160 hours)  CBC with Differential/Platelet     Status: Abnormal   Collection Time: 05/07/24  8:38 AM  Result Value Ref Range   WBC 5.0 3.4 - 10.8 x10E3/uL   RBC 5.06 4.14 - 5.80 x10E6/uL   Hemoglobin 15.4 13.0 - 17.7 g/dL   Hematocrit 52.4 62.4 - 51.0 %    MCV 94 79 - 97 fL   MCH 30.4 26.6 - 33.0 pg   MCHC 32.4 31.5 - 35.7 g/dL   RDW 87.2 88.3 - 84.5 %   Platelets 204 150 - 450 x10E3/uL   Neutrophils 49 Not Estab. %   Lymphs 24 Not Estab. %   Monocytes 15 Not Estab. %   Eos 11 Not Estab. %   Basos 1 Not Estab. %   Neutrophils Absolute 2.5 1.4 - 7.0 x10E3/uL   Lymphocytes Absolute 1.2 0.7 - 3.1 x10E3/uL   Monocytes Absolute 0.7 0.1 - 0.9 x10E3/uL   EOS (ABSOLUTE) 0.5 (H) 0.0 - 0.4 x10E3/uL   Basophils Absolute 0.0 0.0 - 0.2 x10E3/uL   Immature Granulocytes 0 Not Estab. %   Immature Grans (Abs) 0.0 0.0 - 0.1 x10E3/uL  QuantiFERON-TB Gold Plus     Status: None   Collection Time: 05/07/24  8:38 AM  Result Value Ref Range   QuantiFERON Incubation Incubation performed.    QuantiFERON Criteria Comment     Comment: QuantiFERON-TB Gold Plus is a qualitative indirect test for M tuberculosis infection (including disease) and is intended for use in conjunction with risk assessment, radiography, and other medical and diagnostic evaluations. The QuantiFERON-TB Gold Plus result is determined by subtracting the Nil value from either TB antigen (Ag) value. The Mitogen tube serves as a control for the test.    QuantiFERON TB1 Ag Value 0.10 IU/mL   QuantiFERON TB2 Ag Value 0.10 IU/mL   QuantiFERON Nil Value 0.10 IU/mL   QuantiFERON Mitogen Value >10.00 IU/mL   QuantiFERON-TB Gold Plus Negative Negative    Comment: No response to M tuberculosis antigens detected. Infection with M tuberculosis is unlikely, but high risk individuals should be considered for additional testing (ATS/IDSA/CDC Clinical Practice Guidelines, 2017). The reference range is an Antigen minus Nil result of <0.35 IU/mL. Chemiluminescence immunoassay methodology   CMP14+EGFR     Status: Abnormal   Collection Time: 05/07/24  8:38 AM  Result Value Ref Range   Glucose 90 70 - 99 mg/dL   BUN 23 6 - 24 mg/dL   Creatinine, Ser 9.02 0.76 - 1.27 mg/dL   eGFR 94 >40 fO/fpw/8.26    BUN/Creatinine Ratio 24 (H) 9 - 20   Sodium 142 134 - 144 mmol/L   Potassium 4.7 3.5 - 5.2 mmol/L   Chloride 105 96 - 106 mmol/L   CO2 21 20 - 29 mmol/L   Calcium  9.5 8.7 - 10.2 mg/dL   Total Protein 7.3 6.0 - 8.5 g/dL   Albumin 4.4 3.8 - 4.9 g/dL   Globulin, Total 2.9 1.5 - 4.5 g/dL   Bilirubin Total 0.3 0.0 - 1.2 mg/dL   Alkaline Phosphatase 68 47 - 123 IU/L   AST 25 0 - 40 IU/L   ALT 30 0 - 44 IU/L  Glucose, capillary     Status: None   Collection Time: 05/18/24  7:57 AM  Result Value Ref Range   Glucose-Capillary 78 70 - 99 mg/dL    Comment: Glucose reference range applies only to samples  taken after fasting for at least 8 hours.  Pulmonary function test     Status: None (Preliminary result)   Collection Time: 05/21/24  8:42 AM  Result Value Ref Range   FVC-Pre 3.58 L   FVC-%Pred-Pre 82 %   FVC-Post 3.54 L   FVC-%Pred-Post 81 %   FVC-%Change-Post 0 %   FEV1-Pre 1.80 L   FEV1-%Pred-Pre 53 %   FEV1-Post 2.06 L   FEV1-%Pred-Post 61 %   FEV1-%Change-Post 14 %   FEV6-Pre 3.52 L   FEV6-%Pred-Pre 84 %   FEV6-Post 3.51 L   FEV6-%Pred-Post 83 %   FEV6-%Change-Post 0 %   Pre FEV1/FVC ratio 50 %   FEV1FVC-%Pred-Pre 65 %   Post FEV1/FVC ratio 58 %   FEV1FVC-%Change-Post 15 %   Pre FEV6/FVC Ratio 98 %   FEV6FVC-%Pred-Pre 102 %   Post FEV6/FVC ratio 99 %   FEV6FVC-%Pred-Post 102 %   FEV6FVC-%Change-Post 0 %   FEF 25-75 Pre 0.83 L/sec   FEF2575-%Pred-Pre 27 %   FEF 25-75 Post 1.17 L/sec   FEF2575-%Pred-Post 38 %   FEF2575-%Change-Post 41 %   RV 1.99 L   RV % pred 107 %   TLC 5.76 L   TLC % pred 93 %   DLCO unc 21.20 ml/min/mmHg   DLCO unc % pred 83 %   DLCO cor 20.74 ml/min/mmHg   DLCO cor % pred 81 %   DL/VA 5.65 ml/min/mmHg/L   DL/VA % pred 97 %  *PFTs discussed with patient.       Assessment & Plan:     ICD-10-CM   1. Sarcoidosis - Pulmonary/Cardiac  D86.9    Paradoxical reaction to Humira  given for cardiac sarcoid Increase activity of pulmonary sarcoid     2. Stage 2 moderate COPD by GOLD classification (HCC)  J44.9     3. Shortness of breath  R06.02      No orders of the defined types were placed in this encounter.  Meds ordered this encounter  Medications   predniSONE  (DELTASONE ) 10 MG tablet    Sig: Take 2 tablets (20 mg total) by mouth daily with breakfast.    Dispense:  60 tablet    Refill:  1   Discussion:    Pulmonary sarcoidosis with active lung involvement Paradoxical reaction to Humira , effective for cardiac sarcoidosis but causing lung flare-up. PET scan shows increased lung activity, indicating active sarcoidosis. No evidence of malignancy. Lung function slightly decreased, likely due to flare-up. Shortness of breath has worsened. - Held Humira . - Prescribed prednisone  20 mg daily. - Referred to rheumatologist for Remicade initiation. - Scheduled follow-up in 3-4 weeks. - Has upcoming appointment for echocardiogram with cardiology at [facility].  Chronic obstructive pulmonary disease (COPD) Stage 2 COPD with no worsening of emphysema on imaging. Lung function stable to slightly decreased, possibly due to sarcoidosis flare-up.   Advised if symptoms do not improve or worsen, to please contact office for sooner follow up or seek emergency care.    I spent 45 minutes of dedicated to the care of this patient on the date of this encounter to include pre-visit review of records, face-to-face time with the patient discussing conditions above, post visit ordering of testing, clinical documentation with the electronic health record, making appropriate referrals as documented, and communicating necessary findings to members of the patients care team.     C. Leita Sanders, MD Advanced Bronchoscopy PCCM [facility]    *This note was generated using voice recognition software/Dragon and/or AI  transcription program.  Despite best efforts to proofread, errors can occur which can change the meaning. Any  transcriptional errors that result from this process are unintentional and may not be fully corrected at the time of dictation.

## 2024-05-21 NOTE — Progress Notes (Signed)
 Full PFT completed today ? ?

## 2024-05-21 NOTE — Telephone Encounter (Signed)
 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  She thinks this may be a paradoxical effect of Humira  which is a documented phenomenon.  I think the the best alternative to try to first would be to switch to Remicade infusions and he would need to stop the Humira .  I think they are also restarting him on steroid treatment as well in the short-term.  We have an upcoming appointment within 2 weeks and can discuss this in more detail at that time as well.

## 2024-05-26 ENCOUNTER — Ambulatory Visit: Admitting: Pulmonary Disease

## 2024-05-26 ENCOUNTER — Encounter

## 2024-06-01 ENCOUNTER — Encounter: Payer: Self-pay | Admitting: Internal Medicine

## 2024-06-01 ENCOUNTER — Other Ambulatory Visit: Payer: Self-pay | Admitting: Pharmacist

## 2024-06-01 DIAGNOSIS — Z111 Encounter for screening for respiratory tuberculosis: Secondary | ICD-10-CM | POA: Insufficient documentation

## 2024-06-01 NOTE — Progress Notes (Signed)
 Referral placed for Avsola [phone]) at [facility] [phone]) - [place] to start benefits investigation. He is switching from Humira  (joint decision between cardiology and pulmonology)  Diagnosis: sarcoidosis  Provider: Dr. Lonni Ester  Dose: 5 mg/kg at Week 0, Week 2, Week 6 then every 8 weeks thereafter  Premedications: acetaminophen 650mg  p.o., diphenhydramine 25mg  p.o., and Solumedrol 40mg  IV Labs to be drawn with infusions: CBC/CMP at Week 6 then every 8 weeks  Last Clinic Visit: 11/06/2023 Next Clinic Visit: not yet scheduled  Pertinent baseline labs: TB gold negative on 05/07/2024, CBC/CMP on 05/07/2024, Hepatitis B/C negative on 06/11/2022  Once benefits are processed, infusion center will contact patient to schedule.   Sherry Pennant, PharmD, MPH, BCPS, CPP Clinical Pharmacist [facility])

## 2024-06-02 ENCOUNTER — Ambulatory Visit: Admitting: Internal Medicine

## 2024-06-02 DIAGNOSIS — Z79899 Other long term (current) drug therapy: Secondary | ICD-10-CM

## 2024-06-02 DIAGNOSIS — D8685 Sarcoid myocarditis: Secondary | ICD-10-CM

## 2024-06-02 NOTE — Telephone Encounter (Signed)
 Referral for infliximab infusions has been placed to [facility]  Sherry Pennant, PharmD, MPH, BCPS, CPP Clinical Pharmacist [facility])

## 2024-06-23 ENCOUNTER — Ambulatory Visit: Admitting: Pulmonary Disease

## 2024-06-25 ENCOUNTER — Encounter: Payer: Self-pay | Admitting: Pulmonary Disease

## 2024-06-25 ENCOUNTER — Encounter: Payer: Self-pay | Admitting: Internal Medicine

## 2024-06-26 NOTE — Telephone Encounter (Signed)
 Since his heart function has declined some, this is of concern.  This could be due to scarring in the heart that has been leftover from the sarcoidosis.  This does not usually show up on PET if it is not active however, the scarring itself is irreversible.  This of course raises a concern with the infliximab.  The other alternatives are have also their issues.  Methotrexate  failed for his heart but was okay in the lungs, azathioprine can cause pretty serious side effects particularly on the bone marrow, ACTH  gel is contraindicated with cardiac failure and can cause cardiac hypertrophy (I have also had very bad experiences treating patients with this), rituximab can cause significant pulmonary toxicity which can be irreversible, mycophenolate or CellCept may be a good option in this regard.   What I would like to do is bring him in for further discussion next week.  I am proposing doing a bronchoscopy and repeat biopsies as well as cultures.  I would like to discuss all of these issues in person with him.

## 2024-06-29 ENCOUNTER — Telehealth (HOSPITAL_COMMUNITY): Payer: Self-pay | Admitting: Pharmacy Technician

## 2024-06-29 NOTE — Telephone Encounter (Signed)
 Auth Submission: APPROVED Site of care: [facility] Payer: [employer] Commercial Medication & CPT/J Code(s) submitted: Avsola (infliximab-axxq) [id number] Diagnosis Code: D86.85, D86.0, Z11.1 Route of submission (phone, fax, portal): portal Phone # Fax # Auth type: Buy/Bill HB Units/visits requested: 5 mg/kg at Week 0, Week 2, Week 6 then every 8 weeks thereafter  Reference number: [id number] Call ref # [id number] Approval from: 06/02/24 to 11/30/24    Dagoberto Armour, CPhT [facility] Phone: [phone] 06/29/2024

## 2024-07-02 ENCOUNTER — Ambulatory Visit: Admitting: Pulmonary Disease

## 2024-07-02 ENCOUNTER — Telehealth: Payer: Self-pay

## 2024-07-02 ENCOUNTER — Encounter: Payer: Self-pay | Admitting: Pulmonary Disease

## 2024-07-02 VITALS — BP 106/80 | HR 76 | Temp 98.1°F | Ht 66.0 in | Wt 166.0 lb

## 2024-07-02 DIAGNOSIS — R590 Localized enlarged lymph nodes: Secondary | ICD-10-CM

## 2024-07-02 DIAGNOSIS — Z87891 Personal history of nicotine dependence: Secondary | ICD-10-CM | POA: Diagnosis not present

## 2024-07-02 DIAGNOSIS — D869 Sarcoidosis, unspecified: Secondary | ICD-10-CM | POA: Diagnosis not present

## 2024-07-02 DIAGNOSIS — R918 Other nonspecific abnormal finding of lung field: Secondary | ICD-10-CM

## 2024-07-02 NOTE — H&P (View-Only) (Signed)
 "  Subjective:    Patient ID: Steve Wang, male    DOB: [birth year], 53 y.o.   MRN: [id number]  Patient Care Team: [name], MD as PCP - General (Family Medicine) [name], MD as PCP - Cardiology (Cardiology) [name], MD as Consulting Physician (Pulmonary Disease)  Chief Complaint  Patient presents with   Sarcoidosis    Worsening shortness of breath. Humira  on hold. Taking Stiolto, most days.     BACKGROUND/INTERVAL: Steve Wang is a 53 year old very complex former smoker with 25-pack-year history of smoking and a history of sarcoidosis first diagnosed in 2012 at the [facility] [place]  by lung biopsy. 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  He has been started by [facility] rheumatology in March 2024 on Humira  for management of his cardiac sarcoid as he has continued to show activity on methotrexate  and prednisone .  At that time his pulmonary sarcoid was quiescent.  He follows with cardiac PET/CTs at [facility].  He also follows with cardiology through [facility]. The patient was last seen here on 01 May 2024.  He also has a COPD component and is on Stiolto for the same.  He discontinued Ohtuvayre  due to feeling it did not help him.  In the interim PET/CT has now shown reactivity of pulmonary sarcoid despite no activity cardiac wise.  Patient has worsening shortness of breath.  Echocardiogram shows diminished cardiac function.  HPI Discussed the use of AI scribe software for clinical note transcription with the patient, who gave verbal consent to proceed.  History of Present Illness   Steve Wang is a 53 year old male with sarcoidosis who presents for follow-up.  He presents with his wife, [name].  He has a history of sarcoidosis with cardiac complications, including decreased ejection fraction. 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  Discussion about what new agent to use.  Discussed all options.  Methotrexate  failed previously.  Are concerns about the side effects of azathioprine due to bone marrow effects and ACTH  gel due to his cardiac condition (known side effect of cardiac hypertrophy and contraindicated in cardiac failure). Rituximab is not preferred due to potential pulmonary toxicity.  Mycophenolate may be an okay alternative.  Discussed that it would be prudent to repeat bronchoscopy and endobronchial ultrasound to sample area as active on PET/CT to ensure there is no other secondary issues that have developed.  His cardiac history includes consultations with Dr. Gollan and Dr. Rolan, and he has sought care at a heart failure clinic at [facility].  He is dissatisfied with the care he is receiving at [facility].  He would like to return to [facility] health for cardiac care however prefers it to be with a new physician.  There is a history of mold exposure and Epstein-Barr virus, identified during previous functional medicine evaluations. He has the MTHFR gene mutation, affecting his ability to process folic acid , complicating methotrexate  treatment.  He previously consulted with a functional medicine doctor but discontinued supplements after not feeling improvement.     DATA: 10/13/2010 CT chest: Report only, performed at [facility] [place] : Upper lung predominant parenchymal changes, scarring and traction bronchiectasis with small peribronchial and perifissural nodules.  Enlarged bilateral hilar and mediastinal lymph nodes, findings all suggestive of  sarcoidosis. 11/03/2010: ACE level 98, eosinophils elevated 8.1% 11/09/2010 Bronchoscopy specimens: [facility] , noncaseating granulomas right upper lobe, fungal culture negative for nocardia, Doratomyces species isolated,  contaminant.  BAL 63% histiocytes, 33% lymphocytes, 2% neutrophils, 2% eosinophils.  Flow cytometry favored sarcoidosis. 09/21/2013 spirometry: Performed at [facility] [place] , FEV1 2.72 L or 76% predicted, FVC 4.42 L or 95% predicted, FEV1/FVC 61%, DLCO moderately reduced.  Consistent with mild obstructive defect (not congruent with sarcoidosis). 09/22/2018 2D echo: Performed in [facility] [place], LVEF 55 to 60% mild LV dilatation, no wall motion abnormalities, otherwise normal. 08/11/2020 angiotensin-converting enzyme: 61 (reference range 14 to 82 units/L) 08/30/2020 CT chest with contrast: Perihilar traction bronchiectasis, parenchymal retraction and architectural distortion consistent with history of sarcoid.  Left ventricular dilatation.  3 mm left lower lobe nodule. 08/30/2020 echocardiogram: LVEF 45 to 50%, LV with mildly decreased function, global hypokinesis, LV cavity size severely dilated, grade II diastolic dysfunction. 10/24/2020 cardiac morphology MR: Mild to moderate dilated left ventricle, normal LV thickness, global hypokinesis, no regional wall motion abnormalities.  Reduced RV and LV systolic function LVEF 36%.  No evidence for infiltrative disease.  Negative for cardiac sarcoidosis. 12/15/2020 CT coronary morphology: Coronary calcium  score of 0, no evidence of CAD.  Lung windows findings as previous. 07/06/2021 myocardial PET/CT [facility]): Abnormal perfusion of the myocardium, normal wall motion and normal thickening.  No coronary artery calcifications, evidence of extracardiac areas in the chest of hypermetabolism suggest sarcoidosis.  Focal increased FDG uptake in the distal esophagus query esophagitis. 08/14/2021 CT chest with contrast: Stable exam from prior, no progressive findings, architectural distortion and scarring bilaterally compatible with prior history of sarcoidosis.  Stable 3 mm left lower lobe pulmonary nodule.  Pulmonary emphysema. 10/05/2021 PFTs: FEV1 2.21 L or 64%  predicted, FVC 3.86 L or 87% predicted, FEV1/FVC 57%, low normal lung volumes, diffusion capacity normal.  Consistent with moderate obstruction.   10/19/2021 PET CT myocardial [facility]): Normal myocardial perfusion, normal left ventricular systolic function, wall motion and thickening, dilated left ventricle.  No areas of extracardiac hypermetabolic activity. 03/19/2022 2D echo: LVEF 40 to 45%, left ventricle demonstrates global hypokinesis. LVEF slightly worse than 08/2020 echocardiogram. 06/06/2022 PET/CT myocardial [facility]): 1. There are several segments ( 7 out of 17) with hypermetabolic activity suggestive of an active inflammatory process in the left ventricular myocardium. 2. Approximately 44% of the left ventricle is involved with predominantly mild/moderate/severe hypermetabolic activity. 3. There is evidence of abnormal metabolism in the right ventricle.  4. Overall findings are consistent with relapse of disease (Sarcoidosis).  06/11/2022 QuantiFERON gold: NEGATIVE 09/05/2022 PET/CT myocardial ([facility]):1. There are several segments with hypermetabolic activity suggestive of an active inflammatory process in the left ventricular myocardium. Previously 7 out of 17 segments and now 9 out of 17 segments. 2. Approximately 58% of the left ventricle is involved with predominantly mild/moderate/severe hypermetabolic activity. 3. There is evidence of mild abnormal metabolism in the right ventricle.  10/11/2022 cardiopulmonary stress test: Spirometry pretest FEV1 2.19 L or 64% predicted, FVC 4.80 L or 94% predicted, FEV1/FVC 54%.  MVV 94 (67%) overall patient gave very good effort.  Pulsoxymeter remained 99 to 100% for the duration of the exercise.  The exercise was performed on a cycle ergometer.  The spirometry does not show significant change from 2023 spirometric values.  The test overall showed low normal functional capacity when compared to much sedentary norms.  Patient appears predominantly pulmonary limited  with mixed restrictive/obstructive lung physiology. 05/31/2023 PFTs: FEV1 1.72 L or 50% predicted  FVC 3.60 L or 82% predicted FEV1/FVC 48% lung volumes normal with air trapping noted by RV.  Diffusion capacity normal.  Findings consistent with severe obstructive lung disease. 06/06/2023 MRI brain: No acute intracranial abnormalities.  No CNS manifestation of sarcoidosis. 11/20/2023 myocardial PET/CT [facility]): Normal myocardial perfusion.  Mildly reduced left ventricular systolic function.  No discernible coronary artery calcifications.  FDG avid hilar and mediastinal nodes likely inflammatory.  Hypermetabolic perihilar scarring slightly increased from prior.  Increased uptake corresponding to facet arthritis at T7-T8. 05/07/2024 CT chest high-resolution: Progressive mediastinal and pulmonary parenchymal sarcoid. 05/07/2024 QuantiFERON TB Gold: Negative. 05/07/2024 eosinophils: 500 cells per microliter 05/18/2024 PET/CT: Hypermetabolic perihilar peribronchial vascular and parenchymal thickening and hypermetabolic mediastinal lymphadenopathy consistent with pulmonary sarcoidosis and mediastinal lymph node sarcoidosis.  No evidence of active sarcoidosis outside of the thorax. 05/21/2024 PFTs: FEV1 1.80 L or 53% predicted, FVC 3.58 L or 82% predicted, FEV1/FVC 50%, there is significant bronchodilator response in on FEV1.  Lung volumes are normal.  Diffusion capacity is normal.  Compared to prior study performed 31 May 2023 there has been no appreciable change. 06/03/2024 echocardiogram [facility]): Moderate ventricular systolic dysfunction with no LVH, LV size is moderately enlarged, estimated EF 40%.  Anterior, anterolateral, lateral, posterior lateral and apical hypokinesis.  Normal LA pressures with diastolic dysfunction (grade 1).  Normal right ventricular systolic function.  Trivial MR, PR and TR.  Review of Systems A 10 point review of systems was performed and it is as noted above otherwise negative.    Patient Active Problem List   Diagnosis Date Noted   Screening for tuberculosis 06/01/2024   Adrenal insufficiency 05/09/2023   Long term current use of systemic steroids 05/09/2023   High risk medication use 06/11/2022   Generalized osteoarthritis 06/11/2022   Cardiac sarcoidosis 11/06/2021   Stage 2 moderate COPD by GOLD classification (HCC) 11/06/2021   Former smoker 11/06/2021   Shortness of breath 11/06/2021   Right bundle branch block 09/10/2018   GERD (gastroesophageal reflux disease) 06/25/2018   Sarcoidosis of lung 06/25/2018   Chronic right shoulder pain 06/25/2018   History of migraine 06/25/2018   History of kidney stones 06/25/2018    Social History   Tobacco Use   Smoking status: Former    Current packs/day: 0.00    Average packs/day: 1 pack/day for 25.0 years (25.0 ttl pk-yrs)    Types: Cigarettes    Start date: 07/24/1983    Quit date: 07/23/2008    Years since quitting: 15.9    Passive exposure: Never   Smokeless tobacco: Former    Types: Chew    Quit date: 07/24/2020  Substance Use Topics   Alcohol use: Not Currently    Allergies[1]  Active Medications[2]  Immunization History  Administered Date(s) Administered   Influenza,inj,Quad PF,6+ Mos 09/08/2012   Influenza-Unspecified 05/07/2011   Pneumococcal Polysaccharide-23 06/25/2018   Tdap 03/24/2013, 06/25/2018        Objective:     Vitals:   07/02/24 1451  BP: 106/80  Pulse: 76  Temp: 98.1 F (36.7 C)  Height: 5' 6 (1.676 m)  Weight: 166 lb (75.3 kg)  SpO2: 96%  TempSrc: Temporal  BMI (Calculated): 26.81     SpO2: 96 %  GENERAL: Well-developed, well-nourished gentleman, no acute distress, fully ambulatory, no conversational dyspnea. HEAD: Normocephalic, atraumatic.  EYES: Pupils equal, round, reactive to light.  No scleral icterus.  MOUTH: Natural dentition, moist.  No thrush. NECK: Supple. No thyromegaly. Trachea midline. No JVD.  No adenopathy. PULMONARY: Good  air entry  bilaterally. No adventitious sounds.  CARDIOVASCULAR: S1 and S2. Regular rate and rhythm.  No rubs, murmurs or gallops heard. ABDOMEN: Benign. MUSCULOSKELETAL: No joint deformity, no clubbing, no edema.  NEUROLOGIC: No overt focal deficit, no gait disturbance noted.  Speech is fluent. SKIN: Intact,warm,dry.  On limited exam no rashes. PSYCH: Mood and behavior normal.   Assessment & Plan:     ICD-10-CM   1. Opacities of both lungs present on chest x-ray  R91.8 CT SUPER D CHEST WO [facility] PILOT    2. Sarcoidosis - Pulmonary/Cardiac  D86.9 CT SUPER D CHEST WO [facility] PILOT    AMB referral to CHF clinic    3. Mediastinal adenopathy  R59.0       Orders Placed This Encounter  Procedures   CT SUPER D CHEST WO [facility] PILOT    Standing Status:   Future    Expiration Date:   07/02/2025    Scheduling Instructions:     Please do before 13 Jul 2024.  Patient has robotic assisted navigational bronchoscopy on 22 Dec.    Preferred imaging location?:   [facility]   AMB referral to CHF clinic    Referral Priority:   Routine    Referral Type:   Consultation    Referral Reason:   Specialty Services Required    Referred to Provider:   Cherrie Toribio SAUNDERS, MD    Number of Visits Requested:   1   Discussion:    Sarcoidosis Chronic sarcoidosis with potential superimposed infection or other pathology. Previous treatments with methotrexate  and Humira  were unsuccessful. Methotrexate  was ineffective, and Humira  has caused a reactivation of his pulmonary sarcoid, a known paradoxical reaction. Azathioprine poses a risk of bone marrow suppression, ACTH  gel is contraindicated due to cardiac issues, and rituximab can cause irreversible pulmonary fibrosis. Mycophenolate (CellCept) is considered the least toxic option. Bronchoscopy is planned to sample lymph nodes and areas of interest to rule out infection or other causes of PET scan findings.  - Scheduled bronchoscopy under general anesthesia with  robotic assistance to sample lymph nodes and areas of interest.  Procedure will be on 22 December, 8 AM. - Will initiate mycophenolate (CellCept) post-bronchoscopy if no infection or other pathology is found. - Will coordinate with pharmacy team for mycophenolate management. - Will send message to Dr. Dan Bensimhon for heart failure clinic referral (discussed with Dr. Cherrie who agrees to evaluate the patient). - Will order CT scan to map airways for bronchoscopy.    Follow-up in 3 to 4 weeks post bronchoscopy.   Advised if symptoms do not improve or worsen, to please contact office for sooner follow up or seek emergency care.    I spent 40 minutes of dedicated to the care of this patient on the date of this encounter to include pre-visit review of records, face-to-face time with the patient discussing conditions above, post visit ordering of testing, clinical documentation with the electronic health record, making appropriate referrals as documented, and communicating necessary findings to members of the patients care team.     C. Leita Sanders, MD Advanced Bronchoscopy PCCM [facility]    *This note was generated using voice recognition software/Dragon and/or AI transcription program.  Despite best efforts to proofread, errors can occur which can change the meaning. Any transcriptional errors that result from this process are unintentional and may not be fully corrected at the time of dictation.     [1] No Known Allergies [2]  Current Meds  Medication  Sig   adalimumab  (HUMIRA ) 40 MG/0.4ML pen Inject 0.4 mLs (40 mg total) into the skin every 14 (fourteen) days.   empagliflozin  (JARDIANCE ) 10 MG TABS tablet Take 1 tablet (10 mg total) by mouth daily before breakfast.   ibuprofen  (ADVIL ) 800 MG tablet TAKE 1 TABLET (800 MG TOTAL) BY MOUTH DAILY AS NEEDED.   metoprolol  succinate (TOPROL -XL) 25 MG 24 hr tablet TAKE 1/2 TABLET BY MOUTH EVERY DAY   OHTUVAYRE  3  MG/2.5ML SUSP Inhale 3 mg into the lungs in the morning and at bedtime.   predniSONE  (DELTASONE ) 10 MG tablet Take 2 tablets (20 mg total) by mouth daily with breakfast.   Tiotropium Bromide-Olodaterol (STIOLTO RESPIMAT ) 2.5-2.5 MCG/ACT AERS INHALE 2 PUFFS BY MOUTH INTO THE LUNGS DAILY   "

## 2024-07-02 NOTE — Patient Instructions (Addendum)
 VISIT SUMMARY:  You came in today for a follow-up visit regarding your sarcoidosis, which has affected your heart and lungs. We discussed your previous treatments and the challenges you've faced with them. We also reviewed your history of mold exposure, Epstein-Barr virus, and the MTHFR gene mutation, which complicates your treatment options.  YOUR PLAN:  -SARCOIDOSIS: Sarcoidosis is a condition where clusters of inflammatory cells form in various organs, often affecting the lungs and lymph nodes. We plan to perform a bronchoscopy under general anesthesia with robotic assistance to sample lymph nodes and areas of interest identified on your PET scan. If no infection or other pathology is found, we will start you on mycophenolate (CellCept), which is considered the least toxic option for your condition. We will also coordinate with the pharmacy team for managing this medication. Additionally, we will send a message to Dr. Dan Bensimhon for a referral to the heart failure clinic and order a scan to map your airways for the bronchoscopy.  INSTRUCTIONS:  Please follow up with the heart failure clinic as referred. We will contact you to schedule the bronchoscopy and the airway mapping scan. If you have any questions or concerns, feel free to reach out to our office.   We discussed that the procedure would have to be done under general anesthesia. The anesthesia team will discuss his part of the process with you.  Complications from the procedure itself are usually minor. One potential complication would be collapse of the lung which can occur in 2-3% of the cases. If  this happens we would have to put a small tube to relieve the collapse and you would have to spend the night in the hospital in that event.  Another possibility would be that of bleeding, this is usually taken care of during the procedure.  In the situations you may need to be observed overnight.  For the most part though, should be able to go  home the same day.  Other possibilities could include that the procedure would be nondiagnostic meaning that no definitive diagnosis could be attained.  We strive to try to decrease these potential issues.

## 2024-07-02 NOTE — Progress Notes (Unsigned)
 Subjective:    Patient ID: Steve Wang, male    DOB: [birth year], 53 y.o.   MRN: [id number]  Patient Care Team: [name], MD as PCP - General (Family Medicine) [name], MD as PCP - Cardiology (Cardiology) [name], MD as Consulting Physician (Pulmonary Disease)  Chief Complaint  Patient presents with   Sarcoidosis    Worsening shortness of breath. Humira  on hold. Taking Stiolto, most days.     BACKGROUND/INTERVAL:  HPI   Review of Systems A 10 point review of systems was performed and it is as noted above otherwise negative.   Patient Active Problem List   Diagnosis Date Noted   Screening for tuberculosis 06/01/2024   Adrenal insufficiency 05/09/2023   Long term current use of systemic steroids 05/09/2023   High risk medication use 06/11/2022   Generalized osteoarthritis 06/11/2022   Cardiac sarcoidosis 11/06/2021   Stage 2 moderate COPD by GOLD classification (HCC) 11/06/2021   Former smoker 11/06/2021   Shortness of breath 11/06/2021   Right bundle branch block 09/10/2018   GERD (gastroesophageal reflux disease) 06/25/2018   Sarcoidosis of lung 06/25/2018   Chronic right shoulder pain 06/25/2018   History of migraine 06/25/2018   History of kidney stones 06/25/2018    Social History   Tobacco Use   Smoking status: Former    Current packs/day: 0.00    Average packs/day: 1 pack/day for 25.0 years (25.0 ttl pk-yrs)    Types: Cigarettes    Start date: 07/24/1983    Quit date: 07/23/2008    Years since quitting: 15.9    Passive exposure: Never   Smokeless tobacco: Former    Types: Chew    Quit date: 07/24/2020  Substance Use Topics   Alcohol use: Not Currently    Allergies[1]  Active Medications[2]  Immunization History  Administered Date(s) Administered   Influenza,inj,Quad PF,6+ Mos 09/08/2012   Influenza-Unspecified 05/07/2011   Pneumococcal Polysaccharide-23 06/25/2018   Tdap 03/24/2013, 06/25/2018        Objective:      Vitals:   07/02/24 1451  BP: 106/80  Pulse: 76  Temp: 98.1 F (36.7 C)  Height: 5' 6 (1.676 m)  Weight: 166 lb (75.3 kg)  SpO2: 96%  TempSrc: Temporal  BMI (Calculated): 26.81     SpO2: 96 %  GENERAL: HEAD: Normocephalic, atraumatic.  EYES: Pupils equal, round, reactive to light.  No scleral icterus.  MOUTH:  NECK: Supple. No thyromegaly. Trachea midline. No JVD.  No adenopathy. PULMONARY: Good air entry bilaterally.  No adventitious sounds. CARDIOVASCULAR: S1 and S2. Regular rate and rhythm.  ABDOMEN: MUSCULOSKELETAL: No joint deformity, no clubbing, no edema.  NEUROLOGIC:  SKIN: Intact,warm,dry. PSYCH:        Assessment & Plan:     ICD-10-CM   1. Opacities of both lungs present on chest x-ray  R91.8 CT SUPER D CHEST WO [facility] PILOT    2. Sarcoidosis - Pulmonary/Cardiac  D86.9 CT SUPER D CHEST WO [facility] PILOT    3. Mediastinal adenopathy  R59.0       Orders Placed This Encounter  Procedures   CT SUPER D CHEST WO [facility] PILOT    Standing Status:   Future    Expiration Date:   07/02/2025    Scheduling Instructions:     Please do before 13 Jul 2024.  Patient has robotic assisted navigational bronchoscopy on 22 Dec.    Preferred imaging location?:   [facility]    No orders of the  defined types were placed in this encounter.     Advised if symptoms do not improve or worsen, to please contact office for sooner follow up or seek emergency care.    I spent xxx minutes of dedicated to the care of this patient on the date of this encounter to include pre-visit review of records, face-to-face time with the patient discussing conditions above, post visit ordering of testing, clinical documentation with the electronic health record, making appropriate referrals as documented, and communicating necessary findings to members of the patients care team.     C. Leita Sanders, MD Advanced Bronchoscopy PCCM [place] [facility] [facility]    *This note  was generated using voice recognition software/Dragon and/or AI transcription program.  Despite best efforts to proofread, errors can occur which can change the meaning. Any transcriptional errors that result from this process are unintentional and may not be fully corrected at the time of dictation.    [1] No Known Allergies [2]  Current Meds  Medication Sig   adalimumab  (HUMIRA ) 40 MG/0.4ML pen Inject 0.4 mLs (40 mg total) into the skin every 14 (fourteen) days.   empagliflozin  (JARDIANCE ) 10 MG TABS tablet Take 1 tablet (10 mg total) by mouth daily before breakfast.   ibuprofen  (ADVIL ) 800 MG tablet TAKE 1 TABLET (800 MG TOTAL) BY MOUTH DAILY AS NEEDED.   metoprolol  succinate (TOPROL -XL) 25 MG 24 hr tablet TAKE 1/2 TABLET BY MOUTH EVERY DAY   OHTUVAYRE  3 MG/2.5ML SUSP Inhale 3 mg into the lungs in the morning and at bedtime.   predniSONE  (DELTASONE ) 10 MG tablet Take 2 tablets (20 mg total) by mouth daily with breakfast.   Tiotropium Bromide-Olodaterol (STIOLTO RESPIMAT ) 2.5-2.5 MCG/ACT AERS INHALE 2 PUFFS BY MOUTH INTO THE LUNGS DAILY

## 2024-07-02 NOTE — Telephone Encounter (Signed)
 Bronchoscopy with EBUS 07/13/2024 8:00 am R91.1 CPT Code: 68372, 31652, 31653  Donzell please see Bronch info.

## 2024-07-03 ENCOUNTER — Encounter: Payer: Self-pay | Admitting: Pulmonary Disease

## 2024-07-06 ENCOUNTER — Encounter (HOSPITAL_COMMUNITY): Payer: Self-pay | Admitting: Pharmacist

## 2024-07-06 NOTE — Addendum Note (Signed)
 Addended by: DAYNE SHERRY RAMAN on: 07/06/2024 02:12 PM   Modules accepted: Orders

## 2024-07-07 ENCOUNTER — Encounter: Payer: Self-pay | Admitting: Pulmonary Disease

## 2024-07-07 ENCOUNTER — Inpatient Hospital Stay
Admission: RE | Admit: 2024-07-07 | Discharge: 2024-07-07 | Disposition: A | Source: Ambulatory Visit | Attending: Pulmonary Disease

## 2024-07-07 ENCOUNTER — Other Ambulatory Visit: Payer: Self-pay

## 2024-07-07 HISTORY — DX: Depression, unspecified: F32.A

## 2024-07-07 HISTORY — DX: Anxiety disorder, unspecified: F41.9

## 2024-07-07 HISTORY — DX: Dyspnea, unspecified: R06.00

## 2024-07-07 HISTORY — DX: Personal history of urinary calculi: Z87.442

## 2024-07-07 HISTORY — DX: Prediabetes: R73.03

## 2024-07-07 HISTORY — DX: Pneumonia, unspecified organism: J18.9

## 2024-07-07 HISTORY — DX: Unspecified osteoarthritis, unspecified site: M19.90

## 2024-07-07 NOTE — Patient Instructions (Addendum)
 Your procedure is scheduled on: 07/13/24 - Monday Report to the Registration Desk on the 1st floor of the [facility]. To find out your arrival time, please call [phone] between 1PM - 3PM on: 07/10/24 - Friday If your arrival time is 6:00 am, do not arrive before that time as the [facility] entrance doors do not open until 6:00 am.  REMEMBER: Instructions that are not followed completely may result in serious medical risk, up to and including death; or upon the discretion of your surgeon and anesthesiologist your surgery may need to be rescheduled.  Do not eat food or drink any liquids after midnight the night before surgery.  No gum chewing or hard candies.  One week prior to surgery: Stop Anti-inflammatories (NSAIDS) such as Advil , Aleve , Ibuprofen , Motrin , Naproxen , Naprosyn  and Aspirin based products such as Excedrin, Goody's Powder, BC Powder. You may take Tylenol if needed for pain up until the day of surgery.  Stop ANY OVER THE COUNTER supplements until after surgery.  Stop Jardiance  3 days before procedure. Take last dose on 12/18.  Stop your Humira  as directed by your doctor.  ON THE DAY OF SURGERY ONLY TAKE THESE MEDICATIONS WITH SIPS OF WATER:  metoprolol  succinate  predniSONE  (DELTASONE )   Use inhalers on the day of surgery and bring to the hospital.   No Alcohol for 24 hours before or after surgery.  No Smoking including e-cigarettes for 24 hours before surgery.  No chewable tobacco products for at least 6 hours before surgery.  No nicotine patches on the day of surgery.  Do not use any recreational drugs for at least a week (preferably 2 weeks) before your surgery.  Please be advised that the combination of cocaine and anesthesia may have negative outcomes, up to and including death. If you test positive for cocaine, your surgery will be cancelled.  On the morning of surgery brush your teeth with toothpaste and water, you may rinse your mouth with  mouthwash if you wish. Do not swallow any toothpaste or mouthwash.  You may shower on day of surgery  Do not wear jewelry, make-up, hairpins, clips or nail polish.  For welded (permanent) jewelry: bracelets, anklets, waist bands, etc.  Please have this removed prior to surgery.  If it is not removed, there is a chance that hospital personnel will need to cut it off on the day of surgery.  Do not wear lotions, powders, or perfumes.   Do not shave body hair from the neck down 48 hours before surgery.  Contact lenses, hearing aids and dentures may not be worn into surgery.  Do not bring valuables to the hospital. [facility] is not responsible for any missing/lost belongings or valuables.   Notify your doctor if there is any change in your medical condition (cold, fever, infection).  Wear comfortable clothing (specific to your surgery type) to the hospital.  After surgery, you can help prevent lung complications by doing breathing exercises.  Take deep breaths and cough every 1-2 hours. Your doctor may order a device called an Incentive Spirometer to help you take deep breaths.  If you are being admitted to the hospital overnight, leave your suitcase in the car. After surgery it may be brought to your room.  In case of increased patient census, it may be necessary for you, the patient, to continue your postoperative care in the Same Day Surgery department.  If you are being discharged the day of surgery, you will not be allowed to  drive home. You will need a responsible individual to drive you home and stay with you for 24 hours after surgery.   If you are taking public transportation, you will need to have a responsible individual with you.  Please call the Pre-admissions Testing Dept. at [phone] if you have any questions about these instructions.  Surgery Visitation Policy:  Patients having surgery or a procedure may have two visitors.  Children under the age of 94 must  have an adult with them who is not the patient.   Merchandiser, Retail to address health-related social needs:  [email or web address]

## 2024-07-09 ENCOUNTER — Encounter: Payer: Self-pay | Admitting: Pulmonary Disease

## 2024-07-09 ENCOUNTER — Encounter: Payer: Self-pay | Admitting: Internal Medicine

## 2024-07-09 NOTE — Progress Notes (Incomplete)
 Perioperative / Anesthesia Services  Pre-Admission Testing Clinical Review / Pre-Operative Anesthesia Consult  Date: 07/09/2024  PATIENT DEMOGRAPHICS: Name: Steve Wang DOB: Apr 05, 1971 MRN:   [id number]  Note: Available PAT nursing documentation and vital signs have been reviewed. Clinical nursing staff has updated patient's PMH/PSHx, current medication list, and drug allergies/intolerances to ensure complete and comprehensive history available to assist care teams in MDM as it pertains to the aforementioned surgical procedure and anticipated anesthetic course. Extensive review of available clinical information personally performed. Nursing documentation reviewed. [place] PMH and PSHx updated with any diagnoses and/or procedures that I have knowledge of that may have been inadvertently omitted during his intake with the pre-admission testing department's nursing staff.  PLANNED SURGICAL PROCEDURE(S):   Case: [id number] Date/Time: 07/13/24 0745   Procedures:      VIDEO BRONCHOSCOPY WITH ENDOBRONCHIAL NAVIGATION (Bilateral)     ENDOBRONCHIAL ULTRASOUND (EBUS) (Bilateral)   Anesthesia type: General   Diagnosis: Mediastinal adenopathy [R59.0]   Pre-op diagnosis: Mediastinal adenopathy   Location: [facility] PROCEDURE RM 02 / [facility] ORS FOR ANESTHESIA GROUP   Surgeons: [name], MD        CLINICAL DISCUSSION: Steve Wang is a 53 y.o. male who is submitted for pre-surgical anesthesia review and clearance prior to him undergoing the above procedure. Patient is a Former Smoker (25 pack years; quit 07/2008). Pertinent PMH includes: NICM, HFrEF, cardiac and pulmonary sarcoidosis, RBBB, prediabetes, COPD, mediastinal adenopathy, lung nodules, GERD (no daily Tx), OA, anxiety, depression.  Patient is followed by cardiology [place], MD). He was last seen in the cardiology clinic on 06/03/2024; notes reviewed. At the time of his clinic visit, patient doing well overall from a cardiovascular  perspective. Patient with significant worsening of his lung condition that he attributed to the use of adalimumab .  Patient denied any chest pain, shortness of breath, PND, orthopnea, palpitations, significant peripheral edema, weakness, fatigue, vertiginous symptoms, or presyncope/syncope. Patient with a past medical history significant for cardiovascular diagnoses. Documented physical exam was grossly benign, providing no evidence of acute exacerbation and/or decompensation of the patient's known cardiovascular conditions.  Coronary CTA was performed on 12/23/2020 that demonstrated an Agatston coronary artery calcium  score of 0. Study demonstrated normal coronary origin with Right dominance.  Pulmonary artery and aorta noted to be of normal size/caliber with no evidence of ectasia.  From cardiac event monitor study performed on 02/06/2023 revealed a predominant underlying sinus rhythm at an average rate of 73 bpm; range 44-124 bpm.  There were 2808 PVCs, accounting for a <1% study burden.  Additionally, there were 223 PACs noted, again accounting for a <1% study burden.  There was 1 episode of PSVT that lasted 3 beats.  There were no sustained arrhythmias or prolonged pauses.  Myocardial PET scan was performed on 11/20/2023 revealing hypermetabolic hilar and mediastinal nodes, felt to be likely inflammatory.  Additionally there was hypermetabolic perihilar scarring increased in conspicuity from prior.  There was increased uptake corresponding to bilateral facet arthritis at the levels of T7-T8.  Gated cardiac PET/CT demonstrated normal myocardial perfusion and mildly reduced left ventricular systolic function.  No evidence to suggest active cardiac sarcoidosis.  NICM with resulting HFrEF being managed on GDMT including beta-blocker (metoprolol  succinate) and SGLT2i (empagliflozin ).  Blood pressure well-controlled at 100/67 mmHg on aforementioned regimen.  Patient is not taking any type of lipid-lowering  therapies for ASCVD prevention.  He is not diabetic.  He does not have an OSAH diagnosis.  Due to his underlying cardiopulmonary sarcoidosis, patient  with limited functional capacity.  While he is able to complete all of his ADLs/IADLs without cardiovascular limitation, strenuous activity causes him to become short of breath.  No changes were made to his medication regimen.  Given his worsening dyspnea, the decision was made to repeat his cardiac function study to further assess.  Patient scheduled follow-up with cardiology in 3 months or sooner if needed.  Since patient was last seen by cardiology, he has undergone the recommended repeat functional study.  Most recent TTE performed on 06/03/2024 revealed a newly reduced left ventricular systolic function with an EF of 40%. There was no LVH.  Left ventricle is moderately enlarged.  There was anterior, anterolateral, lateral, posterior lateral, and apical hypokinesis. Left ventricular diastolic Doppler parameters consistent with abnormal relaxation (G1DD). -14.4% (normal range <-18%). Right ventricular size and function normal with a TAPSE measuring 2.5 cm  (normal range >/= 1.6 cm).  TR jet insufficient for estimating RVSP.  There was trivial pulmonary, mitral, and tricuspid valve regurgitation.  All transvalvular gradients were noted to be normal providing no evidence of hemodynamically significant valvular stenosis.  Aorta dilated at the sinus of Valsalva measuring up to 3.7 cm.  Steve Wang underwent CT imaging of the chest back in 08/2020 that revealed a 3 mm LEFT lower lobe pulmonary nodule.  Pulmonary finding has been followed through serial imaging.  Most recent high-resolution chest CT was performed on 05/07/2024, that demonstrated progressive mediastinal and pulmonary parenchymal sarcoid.  Pulmonary sarcoidosis had been quiescent with no cardiac activity, however now reactivated.  Subsequent PET imaging was performed on 05/18/2024 demonstrated  hypermetabolic perihilar and peribronchovascular parenchymal thickening and hypermetabolic mediastinal lymphadenopathy consistent with pulmonary sarcoidosis and mediastinal lymph node sarcoidosis.  Patient was referred to pulmonary medicine for discussions regarding tissue biopsy to further assess.  He has subsequently been scheduled for a VIDEO BRONCHOSCOPY WITH ENDOBRONCHIAL NAVIGATION send: ENDOBRONCHIAL ULTRASOUND (EBUS) on 05/13/2024 with Dr. Dedra Sanders, MD.   Given patient's past medical history significant for cardiovascular diagnoses, presurgical cardiac clearance was sought by the PAT team. Per cardiology, ***  In review of the patient's medication reconciliation, it is noted that he is on daily oral antithrombotic therapy. Given that patient's past medical history is significant for cardiovascular diagnoses, pulmonary medicine has cleared patient to continue his daily low dose ASA throughout his perioperative course.  Patient has been updated on these directives from his specialty care providers by the PAT team.  Patient denies previous perioperative complications with anesthesia in the past. In review his EMR, there are no records available for review pertaining to any anesthetic courses within the [facility] in the recent past.   MOST RECENT VITAL SIGNS:    07/02/2024    2:51 PM 05/21/2024    9:31 AM 05/21/2024    8:54 AM  Vitals with BMI  Height 5' 6 5' 6 5' 6  Weight 166 lbs 166 lbs 166 lbs  BMI 26.81 26.81 26.81  Systolic 106 110   Diastolic 80 82   Pulse 76 68 68   PROVIDERS/SPECIALISTS: NOTE: Primary physician provider listed below. Patient may have been seen by APP or partner within same practice.   PROVIDER ROLE / SPECIALTY LAST SHERLEAN Sanders Dedra LITTIE, MD Pulmonary Medicine (Surgeon) 07/02/2024  [name], MD Primary Care Provider 07/09/2024  Orlin Ranks, MD Cardiology 06/03/2024   ALLERGIES: Allergies[1]  CURRENT HOME MEDICATIONS:   empagliflozin  (JARDIANCE ) 10 MG TABS tablet   ibuprofen  (ADVIL ) 800 MG tablet  metoprolol  succinate (TOPROL -XL) 25 MG 24 hr tablet   predniSONE  (DELTASONE ) 10 MG tablet   Tiotropium Bromide-Olodaterol (STIOLTO RESPIMAT ) 2.5-2.5 MCG/ACT AERS    albuterol  (PROVENTIL ) (2.5 MG/3ML) 0.083% nebulizer solution 2.5 mg   HISTORY: Past Medical History:  Diagnosis Date   Anxiety    Arthritis    Cardiac sarcoidosis 10/2020   a.) Dx'd 10/2020 in [place]    COPD (chronic obstructive pulmonary disease) (HCC)    Depression    Dyspnea    Emphysema of lung (HCC)    GERD (gastroesophageal reflux disease)    HFrEF (heart failure with reduced ejection fraction) (HCC)    a. 09/2018 Echo: EF 55-60%; b. 08/2020 Echo: EF 45-50%, grade 2 diastolic dysfunction; c. 10/2020 cMRI: EF 36%, mild to mod dil LV. Mod red RV fxn. No LGE/evidence of sarcoid. No signif valvular dzs   History of kidney stones    History of tobacco abuse    Long-term corticosteroid use    Mediastinal adenopathy    Migraines    NICM (nonischemic cardiomyopathy) (HCC)    a. 09/2018 Echo: EF 55-60%; b. 08/2020 Echo: EF 45-50%; c. 10/2020 cMRI: EF 36%, mild to mod dil LV. Mod red RV fxn. No LGE/evidence of sarcoid. No signif valvular dzs; d. 12/2020 Cor CTA: Ca2+= 0. Nl cors.   Pneumonia    Pre-diabetes    Pulmonary nodule    a. 12/2020 CT chest: 1.3cm posterior basal LUL nodule and 3mm LLL nodule - unchanged.   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    Past Surgical History:  Procedure Laterality Date   BRONCHOSCOPY  11/09/2010   [facility] [place]    COLONOSCOPY     UPPER GI ENDOSCOPY     Family History  Problem Relation Age of Onset   COPD Mother    Colon cancer Neg Hx    Pancreatic cancer Neg Hx    Liver cancer Neg Hx    Esophageal cancer Neg Hx     Stomach cancer Neg Hx    Social History   Tobacco Use   Smoking status: Former    Current packs/day: 0.00    Average packs/day: 1 pack/day for 25.0 years (25.0 ttl pk-yrs)    Types: Cigarettes    Start date: 07/24/1983    Quit date: 07/23/2008    Years since quitting: 15.9    Passive exposure: Never   Smokeless tobacco: Former    Types: Chew    Quit date: 07/24/2020  Substance Use Topics   Alcohol use: Not Currently   LABS:  Lab Results  Component Value Date   WBC 5.0 05/07/2024   HGB 15.4 05/07/2024   HCT 47.5 05/07/2024   MCV 94 05/07/2024   PLT 204 05/07/2024   Lab Results  Component Value Date   NA 142 05/07/2024   CL 105 05/07/2024   K 4.7 05/07/2024   CO2 21 05/07/2024   BUN 23 05/07/2024   CREATININE 0.97 05/07/2024   EGFR 94 05/07/2024   CALCIUM  9.5 05/07/2024   ALBUMIN 4.4 05/07/2024   GLUCOSE 90 05/07/2024    ECG: Date: 06/03/2024  Time ECG obtained: 1536 PM Rate: 71 bpm Rhythm: normal sinus; RBBB Axis (leads I and aVF): right Intervals: PR 160 ms. QRS 146 ms. QTc 460 ms. ST segment and T wave changes: No evidence of acute T  wave abnormalities or significant ST segment elevation or depression.  Evidence of a possible, age undetermined, prior infarct:  No Comparison: Similar to previous tracing obtained on 03/16/2022 NOTE: Tracing obtained at [facility]; unable for review. Above based on cardiologist's interpretation.    IMAGING / PROCEDURES: TRANSTHORACIC ECHOCARDIOGRAM performed on 06/03/2024 Mildly reduced left ventricular systolic function with EF of 40% No LVH Left ventricle moderately enlarged Anterior, anterolateral, lateral, posterior lateral, and apical hypokinesis. Left ventricular diastolic Doppler parameters consistent with abnormal relaxation (G1DD). Normal right ventricular size and function Trivial MR, TR, and PR Insufficient TR jet to estimate RVSP. Normal gradients; no valvular stenosis Aorta dilated at the sinus of Valsalva  measuring up to 3.7 cm  PULMONARY FUNCTION TESTING performed on 05/21/2024    Latest Ref Rng & Units 05/21/2024    8:42 AM 05/31/2023   12:19 PM  PFT Results  FVC-Pre L 3.58  3.60   FVC-Predicted Pre % 82  82   FVC-Post L 3.54    FVC-Predicted Post % 81    Pre FEV1/FVC % % 50  48   Post FEV1/FCV % % 58    FEV1-Pre L 1.80  1.72   FEV1-Predicted Pre % 53  50   FEV1-Post L 2.06    DLCO uncorrected ml/min/mmHg 21.20  23.87   DLCO UNC% % 83  92   DLCO corrected ml/min/mmHg 20.74  22.95   DLCO COR %Predicted % 81  89   DLVA Predicted % 97  96   TLC L 5.76  6.59   TLC % Predicted % 93  107   RV % Predicted % 107  146    PET MYOCARDIAL WITH CONCURRENT CT RUBIDIUM RB 82 - [id number] & FDG (F18 FLUORODEOXYGLUCOSE) performed on 11/20/2023 Normal myocardial perfusion.  Mildly reduced left ventricular systolic function.  There are no discernible coronary artery calcifications. CRT-D noted. Extracardiac metabolic findings:  FDG avid hilar and mediastinal nodes, likely inflammatory.  Hypermetabolic perihilar scarring, increased in conspicuity from prior.  Increased uptake corresponding to bilateral facet arthritis at T7-T8.   LONG TERM CARDIAC EVENT MONITOR STUDY performed on 02/06/2023 Study quality adequate for interpretation. Sinus mechanism rhythms noted throughout.   Ventricular ectopy consisted of 2,808 VE beats with a burden of <1 %.     There were 223 SVE beats with a burden of <1 %. There was 1 occurrence of Supraventricular Tachycardia with 3 beats.    No diary events.   CT CORONARY MORPH W/CTA COR W/SCORE W/CA W/CM &/OR WO/CM performed on 12/23/2020 1.3 cm nodular appearing density within the posterior basal left upper lobe and 3 mm nodule in the left lower lobe are both unchanged from previous exam. Future CT at 18-24 months (from today's scan) is considered optional for low-risk patients, but is recommended for high-risk patients.  Chronic interstitial changes consistent with the  history of sarcoid.  IMPRESSION AND PLAN: Steve Wang has been referred for pre-anesthesia review and clearance prior to him undergoing the planned anesthetic and procedural courses. Available labs, pertinent testing, and imaging results were personally reviewed by me in preparation for upcoming operative/procedural course. [facility] medical record has been updated following extensive record review and patient interview with PAT staff.   ***Preopfix ***PPM  ATTENTION --> PENDING CLEARANCE AT THIS TIME -- NOTE/CONTENTS NOT FINAL UNTIL SIGNED This patient has been appropriately cleared by cardiology with an overall *** risk of patient experiencing significant perioperative cardiovascular complications. here at [facility]. Based on clinical  review performed today (07/09/2024), barring any significant acute changes in the patient's overall condition, it is anticipated that he will be able to proceed with the planned surgical intervention. Any acute changes in clinical condition may necessitate his procedure being postponed and/or cancelled. Patient will meet with anesthesia team (MD and/or CRNA) on the day of his procedure for preoperative evaluation/assessment. Questions regarding anesthetic course will be fielded at that time.   Pre-surgical instructions were reviewed with the patient during his PAT appointment, and questions were fielded to satisfaction by PAT clinical staff. He has been instructed on which medications that he will need to hold prior to surgery, as well as the ones that have been deemed safe/appropriate to take on the day of his procedure. As part of the general education provided by PAT, patient made aware both verbally and in writing, that he would need to abstain from the use of any illegal substances during his perioperative course. He was advised that failure to follow the provided instructions could necessitate case cancellation or result in  serious perioperative complications up to and including death. Patient encouraged to contact PAT and/or his surgeon's office to discuss any questions or concerns that may arise prior to surgery; verbalized understanding.   Dorise Pereyra, MSN, APRN, FNP-C, CEN [facility]  Perioperative Services Nurse Practitioner Phone: [phone] Fax: [phone] 07/09/2024 12:12 PM  NOTE: This note has been prepared using Dragon dictation software. Despite my best ability to proofread, there is always the potential that unintentional transcriptional errors may still occur from this process.     [1] No Known Allergies

## 2024-07-09 NOTE — Progress Notes (Signed)
 Chief Complaint Chief Complaint  Patient presents with   Right Shoulder - Pain    06/16/24 picked something up at work ruptured bicep     Reason for Visit The patient is a 53 year old male who presented for complaints of his right shoulder.  The patient states that on June 16, 2024 he was at work when he was unloading a large object.  As he tried to lift it he felt a pop in the right shoulder.  He went to the walk-in clinic that same day where he was treated for a biceps tendon rupture.  He was told to limit activities.  He has been careful with using his right arm.  He is improving and has slowly been increasing his activities at work.  He is back to his normal work activities.  He has not done strengthening exercises yet.  He has no swelling or bruising now.  He does have some occasional posterior shoulder pain and some pain down the forearm.  There is no numbness or tingling now.  There is no swelling or bruising.  The patient is not a diabetic.   Medications Current Outpatient Medications  Medication Sig Dispense Refill   aspirin/acetaminophen/caffeine (EXCEDRIN MIGRAINE ORAL) Take by mouth as needed (migraine)     JARDIANCE  10 mg tablet Take 1 tablet (10 mg total) by mouth once daily 90 tablet 3   spironolactone (ALDACTONE) 25 MG tablet Take 0.5 tablets (12.5 mg total) by mouth once daily 45 tablet 3   tiotropium-olodateroL (STIOLTO RESPIMAT ) 2.5-2.5 mcg/actuation inhaler Inhale 2 inhalations into the lungs once daily     adalimumab  (HUMIRA ) 40 mg/0.4 mL pen injector kit Inject 40 mg subcutaneously every 14 (fourteen) days (Patient not taking: Reported on 06/03/2024) 2 kit 11   metoprolol  succinate (TOPROL -XL) 25 MG XL tablet Take 0.5 tablets (12.5 mg total) by mouth once daily     predniSONE  (DELTASONE ) 10 MG tablet Take 2 tablets (20 mg total) by mouth once daily for 7 days, THEN 1.5 tablets (15 mg total) once daily for 7 days, THEN 1 tablet (10 mg total) once daily for 42  days. 67 tablet 0   No current facility-administered medications for this visit.    Allergies Patient has no known allergies.  Histories Past Medical History: No past medical history on file.  Past Surgical History: History reviewed. No pertinent surgical history.  Social History: Social History   Socioeconomic History   Marital status: Married  Tobacco Use   Smoking status: Former    Current packs/day: 0.00    Types: Cigarettes    Quit date: 07/24/2003    Years since quitting: 20.9   Smokeless tobacco: Former    Types: Chew    Quit date: 07/24/2019  Vaping Use   Vaping status: Never Used  Substance and Sexual Activity   Alcohol use: Never   Drug use: Never   Sexual activity: Defer   Social Drivers of Health   Financial Resource Strain: Low Risk  (07/09/2024)   Overall Financial Resource Strain (CARDIA)    Difficulty of Paying Living Expenses: Not hard at all  Food Insecurity: No Food Insecurity (07/09/2024)   Hunger Vital Sign    Worried About Running Out of Food in the Last Year: Never true    Ran Out of Food in the Last Year: Never true  Transportation Needs: No Transportation Needs (07/09/2024)   PRAPARE - Administrator, Civil Service (Medical): No    Lack of Transportation (Non-Medical): No  Housing Stability: Low Risk  (07/09/2024)   Housing Stability Vital Sign    Unable to Pay for Housing in the Last Year: No    Number of Times Moved in the Last Year: 0    Homeless in the Last Year: No    Family History: No family history on file.  Review of Systems A comprehensive 14 point ROS was performed, reviewed, and the pertinent orthopaedic findings are documented in the HPI.  Exam BP 132/80   Ht 165.1 cm (5' 5)   Wt 77.6 kg (171 lb)   BMI 28.46 kg/m    General/Constitutional: NAD, conversant Eyes: Pupils equal and round, extraocular movements intact ENT: atraumatic external nose and ears, moist mucous  membranes Respiratory: non-labored breathing, symmetric chest rise, chest sounds clear. Cardiovascular: no visible lower extremity edema, peripheral pulses present, regular rate and rhythm  Skin: normal skin turgor, warm and dry Neurological: cranial nerves grossly intact, sensation grossly intact Psychological:  Appropriate mood and affect; appropriate judgment Musculoskeletal: as detailed below:  General: Well developed, well nourished 53 y.o. male in no apparent distress.  Normal affect.  Normal communication.  Patient answers questions appropriately.  The patient has a normal gait.  There is no antalgic component.  There is no hip lurch.    Right upper Extremity: Examination of the right shoulder and arm showed no bony abnormality or edema.  There is a Popeye bicep as compared to the left shoulder with proximal defect.  He has very minimal tenderness in the biceps region.  The patient has normal active and passive motion with abduction, flexion, internal rotation, and external rotation.  The patient has no tenderness with motion.  The patient has a negative Hawkins test and a negative Impingement test.  The patient has a negative drop arm test.  The patient is non-tender along the deltoid muscle.  There is no subacromial space tenderness with no AC joint tenderness.  The patient has no instability of the shoulder with anterior-posterior motion.  There is a negative sulcus sign.  The patient has symmetrical bicep strength with testing as compared to the left arm.  There is no weakness with biceps testing.  The rotator cuff muscle strength is 5/5 with supraspinatus, 5/5 with internal rotation, and 5/5 with external rotation.  There is no crepitus with range of motion activities.    Neurological: The patient has sensation that is intact to light touch and pinprick bilaterally.  The patient has normal grip strength.  The patient has full biceps, wrist extension, grip, and interosseous strength.  The  patient has 2 + DTRs bilaterally.  Vascular: The patient has less than 2 second capillary refill.  The patient has normal ulnar and radial pulses.  The patient has normal warmth to touch.    Radiology: Xrays of the right shoulder were ordered and interpreted 07/09/2024, with 3 views using Y view, Grashey view, Zanka views.  Xrays revealed the right shoulder with no signs of fracture or dislocation.  There is no high riding humeral head.  There is very minimal anterior acromion osteophyte formation at the [facility] joint with no narrowing.    Impression Encounter Diagnoses  Name Primary?   Biceps rupture, proximal, right, initial encounter Yes   Acute pain of right shoulder     Plan   1.  I discussed the physical exam finding as well as the x-ray results and previous treatment.  We discussed his work situation. 2.  The patient was given some bicep tendinitis exercises  to be done at home. 3.  He will slowly increase bicep strengthening. 4.  He will continue his normal work activities with no restrictions. 5.  The patient will follow-up in 4 weeks for deciding about possible release from Workmen's Comp. injury.  This note was generated in part with voice recognition software and I apologize for any typographical errors that were not detected and corrected.  Review of the [place]  CSRS was performed in accordance with the [place] MVE prior to dispensing any controlled substances.   Dorn Krystal Doyne PA-C, [email or web address]

## 2024-07-10 ENCOUNTER — Ambulatory Visit
Admission: RE | Admit: 2024-07-10 | Discharge: 2024-07-10 | Disposition: A | Discharge status: Home / Self Care | Source: Ambulatory Visit | Attending: Pulmonary Disease | Admitting: Pulmonary Disease

## 2024-07-10 ENCOUNTER — Encounter: Payer: Self-pay | Admitting: Urgent Care

## 2024-07-10 DIAGNOSIS — R918 Other nonspecific abnormal finding of lung field: Secondary | ICD-10-CM | POA: Insufficient documentation

## 2024-07-10 DIAGNOSIS — D869 Sarcoidosis, unspecified: Secondary | ICD-10-CM | POA: Diagnosis present

## 2024-07-10 NOTE — Telephone Encounter (Signed)
 For the codes 68372, [id number], [id number] Auth # [id number] valid 07/13/24 to 10/11/2024

## 2024-07-12 MED ORDER — ORAL CARE MOUTH RINSE: 15.0000 mL | Freq: Once | OROMUCOSAL | Status: AC

## 2024-07-12 MED ORDER — IPRATROPIUM-ALBUTEROL 0.5-2.5 (3) MG/3ML IN SOLN: 3.0000 mL | Freq: Once | RESPIRATORY_TRACT | Status: DC

## 2024-07-12 MED ORDER — LACTATED RINGERS IV SOLN: INTRAVENOUS | Status: DC

## 2024-07-12 MED ORDER — CHLORHEXIDINE GLUCONATE 0.12 % MT SOLN
15.0000 mL | Freq: Once | OROMUCOSAL | Status: AC
  Administered 2024-07-13: 15 mL via OROMUCOSAL

## 2024-07-12 MED ORDER — SODIUM CHLORIDE 0.9 % IV SOLN: Freq: Once | INTRAVENOUS | Status: AC

## 2024-07-13 ENCOUNTER — Ambulatory Visit

## 2024-07-13 ENCOUNTER — Other Ambulatory Visit: Payer: Self-pay

## 2024-07-13 ENCOUNTER — Encounter: Payer: Self-pay | Admitting: Urgent Care

## 2024-07-13 ENCOUNTER — Encounter: Admission: RE | Payer: Self-pay

## 2024-07-13 ENCOUNTER — Encounter: Payer: Self-pay | Admitting: Pulmonary Disease

## 2024-07-13 ENCOUNTER — Ambulatory Visit: Payer: Self-pay | Admitting: Urgent Care

## 2024-07-13 ENCOUNTER — Ambulatory Visit
Admission: RE | Admit: 2024-07-13 | Discharge: 2024-07-13 | Disposition: A | Discharge status: Home / Self Care | Attending: Pulmonary Disease | Admitting: Pulmonary Disease

## 2024-07-13 DIAGNOSIS — F32A Depression, unspecified: Secondary | ICD-10-CM | POA: Insufficient documentation

## 2024-07-13 DIAGNOSIS — E7212 Methylenetetrahydrofolate reductase deficiency: Secondary | ICD-10-CM | POA: Insufficient documentation

## 2024-07-13 DIAGNOSIS — F419 Anxiety disorder, unspecified: Secondary | ICD-10-CM | POA: Diagnosis not present

## 2024-07-13 DIAGNOSIS — K219 Gastro-esophageal reflux disease without esophagitis: Secondary | ICD-10-CM | POA: Diagnosis not present

## 2024-07-13 DIAGNOSIS — Z87891 Personal history of nicotine dependence: Secondary | ICD-10-CM | POA: Insufficient documentation

## 2024-07-13 DIAGNOSIS — J439 Emphysema, unspecified: Secondary | ICD-10-CM | POA: Diagnosis not present

## 2024-07-13 DIAGNOSIS — Z77120 Contact with and (suspected) exposure to mold (toxic): Secondary | ICD-10-CM | POA: Diagnosis not present

## 2024-07-13 DIAGNOSIS — R590 Localized enlarged lymph nodes: Secondary | ICD-10-CM | POA: Diagnosis present

## 2024-07-13 DIAGNOSIS — M1990 Unspecified osteoarthritis, unspecified site: Secondary | ICD-10-CM | POA: Diagnosis not present

## 2024-07-13 DIAGNOSIS — Z7902 Long term (current) use of antithrombotics/antiplatelets: Secondary | ICD-10-CM | POA: Diagnosis not present

## 2024-07-13 DIAGNOSIS — I5022 Chronic systolic (congestive) heart failure: Secondary | ICD-10-CM | POA: Diagnosis not present

## 2024-07-13 DIAGNOSIS — I428 Other cardiomyopathies: Secondary | ICD-10-CM | POA: Diagnosis not present

## 2024-07-13 DIAGNOSIS — D860 Sarcoidosis of lung: Secondary | ICD-10-CM | POA: Diagnosis not present

## 2024-07-13 DIAGNOSIS — R918 Other nonspecific abnormal finding of lung field: Secondary | ICD-10-CM

## 2024-07-13 HISTORY — PX: VIDEO BRONCHOSCOPY WITH ENDOBRONCHIAL NAVIGATION: SHX6175

## 2024-07-13 HISTORY — DX: Personal history of nicotine dependence: Z87.891

## 2024-07-13 HISTORY — DX: Long term (current) use of systemic steroids: Z79.52

## 2024-07-13 HISTORY — DX: Localized enlarged lymph nodes: R59.0

## 2024-07-13 HISTORY — DX: Other adrenocortical insufficiency: E27.49

## 2024-07-13 HISTORY — DX: Migraine, unspecified, not intractable, without status migrainosus: G43.909

## 2024-07-13 HISTORY — PX: ENDOBRONCHIAL ULTRASOUND: SHX5096

## 2024-07-13 LAB — GLUCOSE, CAPILLARY: Glucose-Capillary: 88 mg/dL (ref 70–99)

## 2024-07-13 LAB — BODY FLUID CELL COUNT WITH DIFFERENTIAL
Eos, Fluid: 2 %
Lymphs, Fluid: 43 %
Monocyte-Macrophage-Serous Fluid: 10 %
Neutrophil Count, Fluid: 45 %
Total Nucleated Cell Count, Fluid: 31 uL

## 2024-07-13 LAB — TROPONIN T, HIGH SENSITIVITY: Troponin T High Sensitivity: <15 ng/L (ref 0–19)

## 2024-07-13 MED ORDER — EPHEDRINE SULFATE-NACL 50-0.9 MG/10ML-% IV SOSY
PREFILLED_SYRINGE | INTRAVENOUS | Status: DC | PRN
  Administered 2024-07-13 (×2): 5 mg via INTRAVENOUS

## 2024-07-13 MED ORDER — SUGAMMADEX SODIUM 200 MG/2ML IV SOLN
INTRAVENOUS | Status: DC | PRN
  Administered 2024-07-13: 150 mg via INTRAVENOUS

## 2024-07-13 MED ORDER — DEXMEDETOMIDINE HCL IN NACL 200 MCG/50ML IV SOLN
INTRAVENOUS | Status: DC | PRN
  Administered 2024-07-13: 8 ug via INTRAVENOUS

## 2024-07-13 MED ORDER — LACTATED RINGERS IV SOLN: INTRAVENOUS | Status: DC

## 2024-07-13 MED ORDER — PROPOFOL 10 MG/ML IV BOLUS
INTRAVENOUS | Status: DC | PRN
  Administered 2024-07-13: 150 mg via INTRAVENOUS

## 2024-07-13 MED ORDER — ROCURONIUM BROMIDE 10 MG/ML (PF) SYRINGE
PREFILLED_SYRINGE | INTRAVENOUS | Status: AC
  Filled 2024-07-13: qty 10

## 2024-07-13 MED ORDER — OXYCODONE HCL 5 MG/5ML PO SOLN: 5.0000 mg | Freq: Once | ORAL | Status: DC | PRN

## 2024-07-13 MED ORDER — ROCURONIUM BROMIDE 100 MG/10ML IV SOLN
INTRAVENOUS | Status: DC | PRN
  Administered 2024-07-13: 10 mg via INTRAVENOUS
  Administered 2024-07-13: 40 mg via INTRAVENOUS
  Administered 2024-07-13: 10 mg via INTRAVENOUS

## 2024-07-13 MED ORDER — ONDANSETRON HCL 4 MG/2ML IJ SOLN
INTRAMUSCULAR | Status: AC
  Filled 2024-07-13: qty 2

## 2024-07-13 MED ORDER — IPRATROPIUM BROMIDE 0.02 % IN SOLN
0.5000 mg | Freq: Once | RESPIRATORY_TRACT | Status: AC
  Administered 2024-07-13: 0.5 mg via RESPIRATORY_TRACT
  Filled 2024-07-13 (×3): qty 2.5

## 2024-07-13 MED ORDER — FENTANYL CITRATE (PF) 100 MCG/2ML IJ SOLN
INTRAMUSCULAR | Status: DC | PRN
  Administered 2024-07-13: 25 ug via INTRAVENOUS
  Administered 2024-07-13: 50 ug via INTRAVENOUS
  Administered 2024-07-13: 25 ug via INTRAVENOUS

## 2024-07-13 MED ORDER — LIDOCAINE HCL (PF) 2 % IJ SOLN
INTRAMUSCULAR | Status: AC
  Filled 2024-07-13: qty 5

## 2024-07-13 MED ORDER — OXYCODONE HCL 5 MG PO TABS: 5.0000 mg | ORAL_TABLET | Freq: Once | ORAL | Status: DC | PRN

## 2024-07-13 MED ORDER — ONDANSETRON HCL 4 MG/2ML IJ SOLN: 4.0000 mg | Freq: Once | INTRAMUSCULAR | Status: DC | PRN

## 2024-07-13 MED ORDER — MIDAZOLAM HCL 2 MG/2ML IJ SOLN
INTRAMUSCULAR | Status: AC
  Filled 2024-07-13: qty 2

## 2024-07-13 MED ORDER — ACETAMINOPHEN 10 MG/ML IV SOLN: 1000.0000 mg | Freq: Once | INTRAVENOUS | Status: DC | PRN

## 2024-07-13 MED ORDER — ONDANSETRON HCL 4 MG/2ML IJ SOLN
INTRAMUSCULAR | Status: DC | PRN
  Administered 2024-07-13: 4 mg via INTRAVENOUS

## 2024-07-13 MED ORDER — DEXMEDETOMIDINE HCL IN NACL 80 MCG/20ML IV SOLN
INTRAVENOUS | Status: AC
  Filled 2024-07-13: qty 20

## 2024-07-13 MED ORDER — CHLORHEXIDINE GLUCONATE 0.12 % MT SOLN
OROMUCOSAL | Status: AC
  Filled 2024-07-13: qty 15

## 2024-07-13 MED ORDER — PROPOFOL 10 MG/ML IV BOLUS
INTRAVENOUS | Status: AC
  Filled 2024-07-13: qty 20

## 2024-07-13 MED ORDER — SUCCINYLCHOLINE CHLORIDE 200 MG/10ML IV SOSY
PREFILLED_SYRINGE | INTRAVENOUS | Status: AC
  Filled 2024-07-13: qty 10

## 2024-07-13 MED ORDER — MIDAZOLAM HCL (PF) 2 MG/2ML IJ SOLN
INTRAMUSCULAR | Status: DC | PRN
  Administered 2024-07-13: 2 mg via INTRAVENOUS

## 2024-07-13 MED ORDER — FENTANYL CITRATE (PF) 100 MCG/2ML IJ SOLN: 25.0000 ug | INTRAMUSCULAR | Status: DC | PRN

## 2024-07-13 MED ORDER — EPHEDRINE 5 MG/ML INJ
INTRAVENOUS | Status: AC
  Filled 2024-07-13: qty 5

## 2024-07-13 MED ORDER — METHYLPREDNISOLONE SODIUM SUCC 125 MG IJ SOLR
INTRAMUSCULAR | Status: DC | PRN
  Administered 2024-07-13: 125 mg via INTRAVENOUS

## 2024-07-13 MED ORDER — METHYLPREDNISOLONE SODIUM SUCC 125 MG IJ SOLR
INTRAMUSCULAR | Status: AC
  Filled 2024-07-13: qty 2

## 2024-07-13 MED ORDER — FENTANYL CITRATE (PF) 100 MCG/2ML IJ SOLN
INTRAMUSCULAR | Status: AC
  Filled 2024-07-13: qty 2

## 2024-07-13 MED ORDER — IPRATROPIUM-ALBUTEROL 0.5-2.5 (3) MG/3ML IN SOLN
RESPIRATORY_TRACT | Status: AC
  Filled 2024-07-13: qty 3

## 2024-07-13 MED ORDER — PHENYLEPHRINE 80 MCG/ML (10ML) SYRINGE FOR IV PUSH (FOR BLOOD PRESSURE SUPPORT)
PREFILLED_SYRINGE | INTRAVENOUS | Status: AC
  Filled 2024-07-13: qty 10

## 2024-07-13 MED ORDER — LIDOCAINE HCL (CARDIAC) PF 100 MG/5ML IV SOSY
PREFILLED_SYRINGE | INTRAVENOUS | Status: DC | PRN
  Administered 2024-07-13: 60 mg via INTRAVENOUS

## 2024-07-13 NOTE — Interval H&P Note (Signed)
 Steve Wang has presented today for surgery, with the diagnosis of SARCOIDOSIS, PROGRESSIVE, query secondary process.  The various methods of treatment have been discussed with the patient and family. After consideration of risks, benefits and other options for treatment, the patient has consented to  Procedure(s): ROBOTIC ASSISTED NAVIGATIONAL BRONCHOSCOPY AND ENDOBRONCHIAL ULTRASOUND-BILATERAL as a surgical intervention.  The patient's history has been reviewed, patient examined, no change in status, stable for surgery.  I have reviewed the patient's chart and labs.  Questions were answered to the patient's satisfaction.  Benefits, limitations and potential complications of the procedure were discussed with the patient/family.  Complications from bronchoscopy are rare and most often minor, but if they occur they may include breathing difficulty, vocal cord spasm, hoarseness, slight fever, vomiting, dizziness, bronchospasm, infection, low blood oxygen, bleeding from biopsy site, or an allergic reaction to medications.  It is uncommon for patients to experience other more serious complications for example: Collapsed lung requiring chest tube placement, respiratory failure, heart attack and/or cardiac arrhythmia.  Patient agrees to proceed  C. Leita Sanders, MD Advanced Bronchoscopy PCCM [place] [facility]    *This note was generated using voice recognition software/Dragon and/or AI transcription program.  Despite best efforts to proofread, errors can occur which can change the meaning. Any transcriptional errors that result from this process are unintentional and may not be fully corrected at the time of dictation.

## 2024-07-13 NOTE — Op Note (Addendum)
 Video Bronchoscopy with Robotic Assisted Bronchoscopic Navigation  Endobronchial Ultrasound Examination of the Mediastinum  Date of Operation: 07/13/2024   Pre-op Diagnosis: Sarcoidosis, now with increased pulmonary activity on PET/CT, query disease reactivation versus infection.  Mediastinal adenopathy, PET/CT avid.  Post-op Diagnosis: Same as above.  Surgeon: KYM Leita Sanders, MD  Assistant: Greig Perry, RRT  Endo techs: Asberry Boehringer, Norleen Gurney Tzintzun  Industry representative: Terrall Shearing  Anesthesia: General endotracheal anesthesia  Operation: Flexible video bronchoscopy with robotic assistance and biopsies.  Estimated Blood Loss: Minimal  Complications: None  Indications and History: Steve Wang is a 53 y.o. male with history of sarcoidosis, previously quiescent, now with increased PET/CT activity.  Recommendation made to achieve a tissue diagnosis via robotic assisted navigational bronchoscopy.  The risks, benefits, complications, treatment options and expected outcomes were discussed with the patient.  The possibilities of pneumothorax, pneumonia, reaction to medication, pulmonary aspiration, perforation of a viscus, bleeding, failure to diagnose a condition and creating a complication requiring transfusion or operation were discussed with the patient who freely signed the consent.    Description of Procedure: The patient was seen in the Preoperative Area, was examined and was deemed appropriate to proceed.  The patient was taken to [facility] Procedure Room 2 (Bronchoscopy Suite), identified as Steve Wang and the procedure verified as Robotic Assisted Video Bronchoscopy.  A Time Out was held and the above information confirmed.   Prior to the date of the procedure a high-resolution CT scan of the chest was performed. Utilizing ION software program a virtual tracheobronchial tree was generated to allow the creation of distinct navigation pathways to the patient's  parenchymal abnormalities. After being taken to the operating room general anesthesia was initiated and the patient  was orally intubated. The Olympus video bronchoscope was introduced via the endotracheal tube and a general inspection was performed which showed normal right and left lung anatomy. Aspiration of the bilateral mainstems was completed to remove any remaining secretions. Robotic catheter was then inserted into patient's endotracheal tube.   Target #1 LUL: The distinct navigation pathways prepared prior to this procedure were then utilized to navigate to patient's lesion identified on CT scan. The robotic catheter was secured into place and the vision probe was withdrawn.  Lesion location was approximated using fluoroscopy.  Local registration and targeting was performed using [employer] OEC 3D system mobile C-arm three-dimensional imaging. Under fluoroscopic guidance transbronchial needle biopsies, and transbronchial forceps biopsies were performed to be sent for cytology and pathology.  Tool-in-lesion was confirmed using [employer] OEC 3D system mobile C-arm.  REBUS was utilized to further confirm target acquisition.  A targeted bronchioalveolar lavage was performed in the LUL and sent for microbiology. Cytotechnologist present to collect material provided for pathology in the appropriate media.  Target #2 RUL: The distinct navigation pathways prepared prior to this procedure were then utilized to navigate to patient's lesion identified on CT scan. The robotic catheter was secured into place and the vision probe was withdrawn.  Lesion location was approximated using fluoroscopy.  Local registration and targeting was performed using [employer] OEC 3D system mobile C-arm three-dimensional imaging. Under fluoroscopic guidance transbronchial needle biopsies, and transbronchial forceps biopsies were performed to be sent for cytology and pathology. Tool-in-lesion was confirmed using [employer] OEC 3D systemmobile C-arm.  REBUS was utilized to further confirm target acquisition.  A bronchioalveolar lavage was performed in the RUL and sent for microbiology.    Cytotechnologist present to collect material provided for pathology  in the appropriate media.  Once sampling was completed, the robotic catheter was removed and the scope was exchanged for an endobronchial ultrasound (EBUS) scope.  Examination of the mediastinum showed that the subcarinal (station 7) area which was PET avid showed some lymphadenopathy however there was increased vascularity of this region.  It was elected not to sample the lymph node.  At the end of the procedure a general airway inspection was performed and there was no evidence of active bleeding. The bronchoscope was removed.  The patient tolerated the procedure well. There was no significant blood loss and there were no obvious complications. A post-procedural chest x-ray showed no pneumothorax.  Intraoperative images:  Scope at target left upper lobe   Scope at target right upper lobe    Samples Target #1: 1. Transbronchial brushings from N/A 2. Transbronchial needle biopsies from LUL: X 6 3. Transbronchial forceps biopsies from LUL: X 6 4. Bronchoalveolar lavage from LUL: 5 mL aliquot 5. Endobronchial biopsies from N/A  Samples Target #2: 1. Transbronchial brushings from N/A 2. Transbronchial needle biopsies from RUL: X 6 3. Transbronchial forceps biopsies from RUL: X 4 4. Bronchoalveolar lavage from RUL: 4 mL aliquot 5. Endobronchial biopsies from N/A   Impression: Sarcoidosis query recurrence versus superimposed infection Mediastinal adenopathy, unable to biopsy due to increased vascularity   Plan:  We will review the cytology, pathology and microbiology results with the patient when they become available.  Outpatient followup will be with Pulmonary Medicine, 8 January.    KYM Leita Sanders, MD Advanced Bronchoscopy PCCM  [place] [facility]    *This note was generated using voice recognition software/Dragon and/or AI transcription program.  Despite best efforts to proofread, errors can occur which can change the meaning. Any transcriptional errors that result from this process are unintentional and may not be fully corrected at the time of dictation.

## 2024-07-13 NOTE — Anesthesia Preprocedure Evaluation (Addendum)
 "                                  Anesthesia Evaluation  Patient identified by MRN, date of birth, ID band Patient awake    Reviewed: Allergy & Precautions, NPO status , Patient's Chart, lab work & pertinent test results  History of Anesthesia Complications Negative for: history of anesthetic complications  Airway Mallampati: IV   Neck ROM: Full    Dental  (+) Missing   Pulmonary COPD, former smoker (quit 2010)   Pulmonary exam normal breath sounds clear to auscultation       Cardiovascular +CHF (NICM, EF 43%)  Normal cardiovascular exam Rhythm:Regular Rate:Normal  Cardiac sarcoidosis  ECG 06/03/24:  Normal sinus rhythm  Right axis deviation  Right bundle branch block  Marked QRS Prolongation  Nonspecific T wave abnormalities   Echo 06/03/24:  MODERATE LEFT VENTRICULAR SYSTOLIC DYSFUNCTION WITH NO LVH, LV SIZE IS MODERATELY ENLARGED  ESTIMATED EF: 40%, CALC EF(3D): 43%  NORMAL LA PRESSURES WITH DIASTOLIC DYSFUNCTION (GRADE 1)  NORMAL RIGHT VENTRICULAR SYSTOLIC FUNCTION  VALVULAR REGURGITATION: No AR, TRIVIAL MR, TRIVIAL PR, TRIVIAL TR                         NO VALVULAR STENOSIS     Neuro/Psych  Headaches PSYCHIATRIC DISORDERS Anxiety Depression       GI/Hepatic ,GERD  ,,  Endo/Other  negative endocrine ROS    Renal/GU Renal disease (nephrolithiasis)     Musculoskeletal  (+) Arthritis ,    Abdominal   Peds  Hematology negative hematology ROS (+)   Anesthesia Other Findings Reviewed and agree with Dorise Boor pre-anesthesia clinical review note.    Cardiology note 06/03/24:  Ryann Leavitt returns to clinic today for management of his cardiac sarcoidosis.  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 Recent PET done at [facility] without cardiac inflammation but images not available so I don't think this is due to cardiac sarcoidosis. Lung  involvement seems to be dominant and is now the treatment-defining organ. Continue pred per pulmonary.  For history of HFmrEF, start back spiro with K check in 1-2 weeks.  If final read of EF is reduced, will plan for stress cMRI when he sees me in 3 months to look for CAD and /or progression of scar.   he will follow-up in 3 months.    Reproductive/Obstetrics                              Anesthesia Physical Anesthesia Plan  ASA: 3  Anesthesia Plan: General   Post-op Pain Management:    Induction: Intravenous  PONV Risk Score and Plan: 2 and Ondansetron , Dexamethasone and Treatment may vary due to age or medical condition  Airway Management Planned: Oral ETT  Additional Equipment:   Intra-op Plan:   Post-operative Plan: Extubation in OR  Informed Consent: I have reviewed the patients History and Physical, chart, labs and discussed the procedure including the risks, benefits and alternatives for the proposed anesthesia with the patient or authorized representative who has indicated his/her understanding and acceptance.     Dental advisory given  Plan Discussed with: CRNA  Anesthesia Plan Comments: (Give intraoperative stress-dose steroids.   Patient consented for risks of anesthesia including but  not limited to:  - adverse reactions to medications - damage to eyes, teeth, lips or other oral mucosa - nerve damage due to positioning  - sore throat or hoarseness - damage to heart, brain, nerves, lungs, other parts of body or loss of life  Informed patient about role of CRNA in peri- and intra-operative care.  Patient voiced understanding.)         Anesthesia Quick Evaluation  "

## 2024-07-13 NOTE — Procedures (Addendum)
 Date of Procedure: 07/13/2024   Pre-op Diagnosis: Sarcoidosis, increased FDG activity query reactivation versus infection  Post-op Diagnosis: Same as above   Surgeon: KYM Leita Sanders, MD  Assistant: Greig Perry, RRT  Endo techs: Asberry Boehringer, Norleen Gurney Tzintzun  Fluoroscopy technician: Lacinda Lot, RT   Anesthesia: General endotracheal anesthesia, see anesthesia record   Operation: Bronchoscopy with robotic assistance and biopsies.   Estimated Blood Loss: Minimal   Complications: None  Procedure Note:  Clinical indication Evaluation of potential reactivation of sarcoidosis by PET/CT, bilateral upper lobe opacities identified on a recent follow-up FDG-PET/CT.   Technique Intraoperative Cone Beam Computed Tomography (CBCT) images were acquired using a [employer] OEC 3D system, optimized for thoracic imaging. A high-quality 30-second acquisition protocol was used. The patient was positioned supine on the examination table. Pre-biopsy CBCT scans were performed to confirm the lesions visibility and to determine the optimal approach. Navigation was performed using an [employer] (Intuitive). Repeat CBCT scans were performed to confirm tool-in-lesion placement and to assess for complications.   Findings Bilateral upper lobe peripheral pulmonary opacities: A solid amorphous conglomeration, and bilateral upper lobes.  The areas exhibit infiltrative/conglomerate appearance. Bronchial Anatomy: Patent airways are visualized up to the segmental bronchi leading towards the target nodule. Navigation: The Ion bronchoscopic navigation successfully acquired the target and guided biopsy tools towards the left upper lobe opacity and right upper lobe opacity. Tool-in-Lesion Confirmation: Intraoperative CBCT confirmed the accurate positioning of the biopsy tools within the opacity. Complications: No immediate complications such as pneumothorax or hemorrhage were observed  during or immediately following the procedure.   Total fluoroscopy time/dose Total time 3 minutes 22 seconds Total dose of 168.2 mGy   Impression CBCT-guided navigation bronchoscopy with biopsy was performed for the peripheral opacities on bilateral upper lobes. The procedure was technically successful with confirmed tool-in-lesion placement. Samples were obtained for histological and cytological examination.   Recommendations Submit biopsy samples for histopathological and cytological analysis. Images were uploaded to PACS. Schedule follow-up appointment with the referring physician to discuss the results and treatment plan. Monitor for delayed complications such as pneumothorax or hemorrhage.   Please see associated operative report for robotic bronchoscopy description.  KYM Leita Sanders, MD Advanced Bronchoscopy PCCM [place] [facility]    *This note was generated using voice recognition software/Dragon and/or AI transcription program.  Despite best efforts to proofread, errors can occur which can change the meaning. Any transcriptional errors that result from this process are unintentional and may not be fully corrected at the time of dictation.

## 2024-07-13 NOTE — Anesthesia Postprocedure Evaluation (Signed)
"   Anesthesia Post Note  Patient: Steve Wang  Procedure(s) Performed: VIDEO BRONCHOSCOPY WITH ENDOBRONCHIAL NAVIGATION (Bilateral) ENDOBRONCHIAL ULTRASOUND (EBUS) (Bilateral)  Patient location during evaluation: PACU Anesthesia Type: General Level of consciousness: awake and alert, oriented and patient cooperative Pain management: pain level controlled Vital Signs Assessment: post-procedure vital signs reviewed and stable Respiratory status: spontaneous breathing, nonlabored ventilation and respiratory function stable Cardiovascular status: blood pressure returned to baseline and stable Postop Assessment: adequate PO intake Anesthetic complications: no Comments: Patient reported chest pain in PACU.  12-lead ECG was unchanged from prior except for possibility of fascicular block.  Chest pain resolved within 30 minutes and troponin was negative. Patient appropriate for discharge home.   No notable events documented.   Last Vitals:  Vitals:   07/13/24 1115 07/13/24 1130  BP: 106/72 102/78  Pulse: 71 72  Resp: 19 (!) 21  Temp: (!) 36.3 C   SpO2: 96% 92%    Last Pain:  Vitals:   07/13/24 1115  TempSrc:   PainSc: 0-No pain                 Alfonso Ruths      "

## 2024-07-13 NOTE — Anesthesia Procedure Notes (Signed)
 Procedure Name: Intubation Date/Time: 07/13/2024 8:10 AM  Performed by: Abdul Andrea LABOR, CRNAPre-anesthesia Checklist: Patient identified, Patient being monitored, Timeout performed, Emergency Drugs available and Suction available Patient Re-evaluated:Patient Re-evaluated prior to induction Oxygen Delivery Method: Circle system utilized Preoxygenation: Pre-oxygenation with 100% oxygen Induction Type: IV induction Ventilation: Mask ventilation without difficulty Laryngoscope Size: 3 and McGrath Grade View: Grade I Tube type: Oral Tube size: 8.5 mm Number of attempts: 1 Airway Equipment and Method: Stylet Placement Confirmation: ETT inserted through vocal cords under direct vision, positive ETCO2 and breath sounds checked- equal and bilateral Secured at: 21 cm Tube secured with: Tape Dental Injury: Teeth and Oropharynx as per pre-operative assessment

## 2024-07-13 NOTE — Transfer of Care (Signed)
 Immediate Anesthesia Transfer of Care Note  Patient: Steve Wang  Procedure(s) Performed: VIDEO BRONCHOSCOPY WITH ENDOBRONCHIAL NAVIGATION (Bilateral) ENDOBRONCHIAL ULTRASOUND (EBUS) (Bilateral)  Patient Location: PACU  Anesthesia Type:General  Level of Consciousness: awake and alert   Airway & Oxygen Therapy: Patient Spontanous Breathing and Patient connected to face mask oxygen  Post-op Assessment: Report given to RN and Post -op Vital signs reviewed and stable  Post vital signs: Reviewed and stable  Last Vitals:  Vitals Value Taken Time  BP 122/82 07/13/24 09:32  Temp 36.1 C 07/13/24 09:32  Pulse 72 07/13/24 09:35  Resp 21 07/13/24 09:35  SpO2 100 % 07/13/24 09:35  Vitals shown include unfiled device data.  Last Pain:  Vitals:   07/13/24 0932  TempSrc:   PainSc: Asleep         Complications: No notable events documented.

## 2024-07-14 ENCOUNTER — Encounter: Payer: Self-pay | Admitting: Pulmonary Disease

## 2024-07-15 LAB — ACID FAST SMEAR (AFB, MYCOBACTERIA): Acid Fast Smear: NEGATIVE

## 2024-07-15 LAB — SURGICAL PATHOLOGY

## 2024-07-15 LAB — CYTOLOGY - NON PAP

## 2024-07-16 LAB — CULTURE, RESPIRATORY W GRAM STAIN
Culture: NO GROWTH
Culture: NORMAL
Gram Stain: NONE SEEN
Gram Stain: NONE SEEN
Special Requests: NORMAL

## 2024-07-18 LAB — ACID FAST SMEAR (AFB, MYCOBACTERIA): Acid Fast Smear: NEGATIVE

## 2024-07-20 ENCOUNTER — Ambulatory Visit: Payer: Self-pay | Admitting: Pulmonary Disease

## 2024-07-20 DIAGNOSIS — D869 Sarcoidosis, unspecified: Secondary | ICD-10-CM

## 2024-07-27 ENCOUNTER — Encounter: Payer: Self-pay | Admitting: Pulmonary Disease

## 2024-07-27 NOTE — Telephone Encounter (Signed)
 I saw him on 11 December.  On 12 December I was able to connect with Dr. Dan Bensimhon from [facility], Dr. Cherrie agreed to take over Mr. Nehme' case.  The referral was sent to the [facility] on 12 December.  On our end is still showing as pending but do remember that we had holidays etc. which may have delayed processing.  I placed a referral for either the [place] or the [place] clinic so that there would not be significant delay and getting him plugged in as soon as possible.  We could probably check to see what the status of the referral is.

## 2024-07-29 NOTE — Telephone Encounter (Signed)
-----   Message from Dedra Sanders, MD sent at 07/29/2024 11:17 AM EST ----- Final cultures have been negative recommend starting mycophenolate at 500 mg twice daily he will need baseline CBC and CMP at beginning of therapy and every 3 months.  Will titrate dose according to  tolerance.  Will need consult to the pharmacologic management team for mycophenolate (CellCept) for cardiac/lung sarcoidosis.

## 2024-07-30 ENCOUNTER — Encounter: Payer: Self-pay | Admitting: Pulmonary Disease

## 2024-07-30 ENCOUNTER — Ambulatory Visit: Admitting: Pulmonary Disease

## 2024-07-30 ENCOUNTER — Other Ambulatory Visit
Admission: RE | Admit: 2024-07-30 | Discharge: 2024-07-30 | Disposition: A | Discharge status: Home / Self Care | Attending: Pulmonary Disease | Admitting: Pulmonary Disease

## 2024-07-30 VITALS — BP 116/80 | HR 72 | Temp 97.6°F | Ht 66.0 in | Wt 174.6 lb

## 2024-07-30 DIAGNOSIS — D8689 Sarcoidosis of other sites: Secondary | ICD-10-CM | POA: Diagnosis not present

## 2024-07-30 DIAGNOSIS — J449 Chronic obstructive pulmonary disease, unspecified: Secondary | ICD-10-CM

## 2024-07-30 DIAGNOSIS — Z87891 Personal history of nicotine dependence: Secondary | ICD-10-CM | POA: Diagnosis not present

## 2024-07-30 DIAGNOSIS — D869 Sarcoidosis, unspecified: Secondary | ICD-10-CM

## 2024-07-30 DIAGNOSIS — D860 Sarcoidosis of lung: Secondary | ICD-10-CM

## 2024-07-30 DIAGNOSIS — I429 Cardiomyopathy, unspecified: Secondary | ICD-10-CM

## 2024-07-30 LAB — COMPREHENSIVE METABOLIC PANEL WITH GFR
ALT: 25 U/L (ref 0–44)
AST: 18 U/L (ref 15–41)
Albumin: 4.5 g/dL (ref 3.5–5.0)
Alkaline Phosphatase: 58 U/L (ref 38–126)
Anion gap: 10 (ref 5–15)
BUN: 20 mg/dL (ref 6–20)
CO2: 25 mmol/L (ref 22–32)
Calcium: 9.5 mg/dL (ref 8.9–10.3)
Chloride: 101 mmol/L (ref 98–111)
Creatinine, Ser: 0.89 mg/dL (ref 0.61–1.24)
GFR, Estimated: >60 mL/min
Glucose, Bld: 118 mg/dL — ABNORMAL HIGH (ref 70–99)
Potassium: 4.5 mmol/L (ref 3.5–5.1)
Sodium: 136 mmol/L (ref 135–145)
Total Bilirubin: 0.4 mg/dL (ref 0.0–1.2)
Total Protein: 7.7 g/dL (ref 6.5–8.1)

## 2024-07-30 LAB — CBC WITH DIFFERENTIAL/PLATELET
Abs Immature Granulocytes: 0.06 K/uL (ref 0.00–0.07)
Basophils Absolute: 0 K/uL (ref 0.0–0.1)
Basophils Relative: 0 %
Eosinophils Absolute: 0.1 K/uL (ref 0.0–0.5)
Eosinophils Relative: 1 %
HCT: 47.4 % (ref 39.0–52.0)
Hemoglobin: 15.8 g/dL (ref 13.0–17.0)
Immature Granulocytes: 1 %
Lymphocytes Relative: 7 %
Lymphs Abs: 0.6 K/uL — ABNORMAL LOW (ref 0.7–4.0)
MCH: 30.7 pg (ref 26.0–34.0)
MCHC: 33.3 g/dL (ref 30.0–36.0)
MCV: 92.2 fL (ref 80.0–100.0)
Monocytes Absolute: 0.4 K/uL (ref 0.1–1.0)
Monocytes Relative: 4 %
Neutro Abs: 8.1 K/uL — ABNORMAL HIGH (ref 1.7–7.7)
Neutrophils Relative %: 87 %
Platelets: 227 K/uL (ref 150–400)
RBC: 5.14 MIL/uL (ref 4.22–5.81)
RDW: 12.6 % (ref 11.5–15.5)
WBC: 9.3 K/uL (ref 4.0–10.5)
nRBC: 0 % (ref 0.0–0.2)

## 2024-07-30 MED ORDER — PREDNISONE 10 MG PO TABS: 10.0000 mg | ORAL_TABLET | Freq: Every day | ORAL | 2 refills | Status: AC

## 2024-07-30 NOTE — Patient Instructions (Signed)
 VISIT SUMMARY:  Today, we discussed the management of your sarcoidosis, which affects both your lungs and heart. We reviewed your recent bronchoscopy results and planned adjustments to your medication regimen to better manage your condition.  YOUR PLAN:  -SARCOIDOSIS WITH PULMONARY AND CARDIAC INVOLVEMENT: Sarcoidosis is a condition where clusters of inflammatory cells form in different organs, in your case, the lungs and heart. 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 To manage this, we are starting you on mycophenolate (CellCept) 500 mg twice daily, which is less toxic to the heart. We are also reducing your prednisone  to 10 mg daily. We have taken baseline blood tests and referred you to the pharmacy team for monitoring your new medication. We will follow up in 4-6 weeks to assess your progress.  INSTRUCTIONS:  Please continue taking your new medication as prescribed and monitor for any side effects. Attend your scheduled cardiac MRI at [facility] and your appointment with the heart failure clinic. Follow up with us  in 4-6 weeks for a reassessment of your condition and medication effectiveness.

## 2024-07-30 NOTE — Progress Notes (Signed)
 "  Subjective:    Patient ID: Steve Wang, male    DOB: [birth year], 54 y.o.   MRN: [id number]  Patient Care Team: Patient, No Pcp Per as PCP - General (General Practice) [name], MD as PCP - Cardiology (Cardiology) [name], MD as Consulting Physician (Pulmonary Disease)  Chief Complaint  Patient presents with   Medical Management of Chronic Issues    Cough, shortness of breath and wheezing. Using Stiolto daily.     BACKGROUND/INTERVAL: Steve Wang is a 54 year old very complex former smoker with 25-pack-year history of smoking and a history of sarcoidosis first diagnosed in 2012 at the [facility] [place]  by lung biopsy. 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  He had been started by [facility] rheumatology in March 2024 on Humira  for management of his cardiac sarcoid as he has continued to show activity on methotrexate  and prednisone .  At that time his pulmonary sarcoid was quiescent.  He follows with cardiac PET/CTs at [facility].  He also follows with cardiology through [facility]. The patient was last seen here on 21 May 2024.  He also has a COPD component and is on Stiolto for the same.  He discontinued Ohtuvayre  due to feeling it did not help him.  Patient unfortunately had decline in cardiac function and paradoxical flare up of pulmonary sarcoid on Humira .  He follows here after bronchoscopy on 22 December which corroborated sarcoidosis recurrence in the lung and no secondary infections noted.   HPI Discussed the use of AI scribe software for clinical note transcription with the patient, who gave verbal consent to proceed.  History of Present Illness   Steve Wang is a 54 year old male with sarcoidosis who presents for management of his condition.  He presents accompanied of his wife, Steve Wang.  He has sarcoidosis affecting both cardiac and pulmonary systems. He is currently taking 20 mg of prednisone   daily. He wants to reduce his prednisone  dosage.  A recent bronchoscopy revealed non-caseating granulomatous inflammation in the left lung, consistent with sarcoidosis. The right upper lobe showed a scar but no active disease. No infection was detected, and all cultures have been negative thus far. A scan indicated calcium  deposits in his coronary arteries.  He is scheduled for a cardiac MRI at [facility] and has an upcoming appointment with a heart failure clinic with Dr. Dan Bensimhon.  He rides his bike at least three miles a day, indicating some level of physical activity. He also mentions a past history of alcohol use but states he no longer drinks, opting for non-alcoholic beverages instead.  During the review of symptoms, he notes no significant change in his breathing with prednisone , although he feels slightly better. He also mentions experiencing bloating that he suspects might be related to food allergies, causing pressure on his diaphragm and difficulty breathing when bending over.     DATA/EVENTS: 10/13/2010 CT chest: Report only, performed at [facility] [place] : Upper lung predominant parenchymal changes, scarring and traction bronchiectasis with small peribronchial and perifissural nodules.  Enlarged bilateral hilar and mediastinal lymph nodes, findings all suggestive of sarcoidosis. 11/03/2010: ACE level 98, eosinophils elevated 8.1% 11/09/2010 Bronchoscopy specimens: [facility] , noncaseating granulomas right upper lobe, fungal culture negative for nocardia, Doratomyces species isolated, contaminant.  BAL 63% histiocytes, 33% lymphocytes, 2% neutrophils, 2% eosinophils.  Flow cytometry favored sarcoidosis. 09/21/2013 spirometry: Performed at [facility] [place] , FEV1 2.72 L or 76% predicted, FVC 4.42 L  or 95% predicted, FEV1/FVC 61%, DLCO moderately reduced.  Consistent with mild obstructive defect (not congruent with sarcoidosis). 09/22/2018 2D echo: Performed in [facility],  [place], LVEF 55 to 60% mild LV dilatation, no wall motion abnormalities, otherwise normal. 08/11/2020 angiotensin-converting enzyme: 61 (reference range 14 to 82 units/L) 08/30/2020 CT chest with contrast: Perihilar traction bronchiectasis, parenchymal retraction and architectural distortion consistent with history of sarcoid.  Left ventricular dilatation.  3 mm left lower lobe nodule. 08/30/2020 echocardiogram: LVEF 45 to 50%, LV with mildly decreased function, global hypokinesis, LV cavity size severely dilated, grade II diastolic dysfunction. 10/24/2020 cardiac morphology MR: Mild to moderate dilated left ventricle, normal LV thickness, global hypokinesis, no regional wall motion abnormalities.  Reduced RV and LV systolic function LVEF 36%.  No evidence for infiltrative disease.  Negative for cardiac sarcoidosis. 12/15/2020 CT coronary morphology: Coronary calcium  score of 0, no evidence of CAD.  Lung windows findings as previous. 07/06/2021 myocardial PET/CT [facility]): Abnormal perfusion of the myocardium, normal wall motion and normal thickening.  No coronary artery calcifications, evidence of extracardiac areas in the chest of hypermetabolism suggest sarcoidosis.  Focal increased FDG uptake in the distal esophagus query esophagitis. 08/14/2021 CT chest with contrast: Stable exam from prior, no progressive findings, architectural distortion and scarring bilaterally compatible with prior history of sarcoidosis.  Stable 3 mm left lower lobe pulmonary nodule.  Pulmonary emphysema. 10/05/2021 PFTs: FEV1 2.21 L or 64% predicted, FVC 3.86 L or 87% predicted, FEV1/FVC 57%, low normal lung volumes, diffusion capacity normal.  Consistent with moderate obstruction.   10/19/2021 PET CT myocardial [facility]): Normal myocardial perfusion, normal left ventricular systolic function, wall motion and thickening, dilated left ventricle.  No areas of extracardiac hypermetabolic activity. 03/19/2022 2D echo: LVEF 40 to 45%,  left ventricle demonstrates global hypokinesis. LVEF slightly worse than 08/2020 echocardiogram. 06/06/2022 PET/CT myocardial [facility]): 1. There are several segments ( 7 out of 17) with hypermetabolic activity suggestive of an active inflammatory process in the left ventricular myocardium. 2. Approximately 44% of the left ventricle is involved with predominantly mild/moderate/severe hypermetabolic activity. 3. There is evidence of abnormal metabolism in the right ventricle.  4. Overall findings are consistent with relapse of disease (Sarcoidosis).  06/11/2022 QuantiFERON gold: NEGATIVE 09/05/2022 PET/CT myocardial ([facility]):1. There are several segments with hypermetabolic activity suggestive of an active inflammatory process in the left ventricular myocardium. Previously 7 out of 17 segments and now 9 out of 17 segments. 2. Approximately 58% of the left ventricle is involved with predominantly mild/moderate/severe hypermetabolic activity. 3. There is evidence of mild abnormal metabolism in the right ventricle.  10/11/2022 cardiopulmonary stress test: Spirometry pretest FEV1 2.19 L or 64% predicted, FVC 4.80 L or 94% predicted, FEV1/FVC 54%.  MVV 94 (67%) overall patient gave very good effort.  Pulsoxymeter remained 99 to 100% for the duration of the exercise.  The exercise was performed on a cycle ergometer.  The spirometry does not show significant change from 2023 spirometric values.  The test overall showed low normal functional capacity when compared to much sedentary norms.  Patient appears predominantly pulmonary limited with mixed restrictive/obstructive lung physiology. 05/31/2023 PFTs: FEV1 1.72 L or 50% predicted FVC 3.60 L or 82% predicted FEV1/FVC 48% lung volumes normal with air trapping noted by RV.  Diffusion capacity normal.  Findings consistent with severe obstructive lung disease. 06/06/2023 MRI brain: No acute intracranial abnormalities.  No CNS manifestation of sarcoidosis. 11/20/2023  myocardial PET/CT [facility]): Normal myocardial perfusion.  Mildly reduced left ventricular systolic function.  No discernible  coronary artery calcifications.  FDG avid hilar and mediastinal nodes likely inflammatory.  Hypermetabolic perihilar scarring slightly increased from prior.  Increased uptake corresponding to facet arthritis at T7-T8. 05/07/2024 CT chest high-resolution: Progressive mediastinal and pulmonary parenchymal sarcoid. 05/07/2024 QuantiFERON TB Gold: Negative. 05/07/2024 eosinophils: 500 cells per microliter 05/21/2024 PFTs: FEV1 1.80 L or 53% predicted, FVC 3.58 L or 82% predicted, FEV1/FVC 50%, there is significant bronchodilator response in on FEV1.  Lung volumes are normal.  Diffusion capacity is normal.  Compared to prior study performed 31 May 2023 there has been no appreciable change. 06/03/2024 echocardiogram [facility]): Moderate left ventricular systolic dysfunction, no LVH LVEF 40%, grade 1 diastolic dysfunction, normal right ventricular systolic function.  Ventricular regurgitation, no AR, trivial MR, trivial PR, trivial TR.  There is anterior, anterolateral, lateral and posterolateral and apical hypokinesis. 07/13/2024 robotic assisted bronchoscopy: Noncaseating granulomas.  AFB, bacterial and fungal cultures negative.  Review of Systems A 10 point review of systems was performed and it is as noted above otherwise negative.   Patient Active Problem List   Diagnosis Date Noted   Opacities of both lungs present on chest x-ray 07/13/2024   Screening for tuberculosis 06/01/2024   Adrenal insufficiency 05/09/2023   Long term current use of systemic steroids 05/09/2023   High risk medication use 06/11/2022   Generalized osteoarthritis 06/11/2022   Cardiac sarcoidosis 11/06/2021   Stage 2 moderate COPD by GOLD classification (HCC) 11/06/2021   Former smoker 11/06/2021   Shortness of breath 11/06/2021   Right bundle branch block 09/10/2018   GERD (gastroesophageal reflux  disease) 06/25/2018   Sarcoidosis of lung 06/25/2018   Chronic right shoulder pain 06/25/2018   History of migraine 06/25/2018   History of kidney stones 06/25/2018    Social History   Tobacco Use   Smoking status: Former    Current packs/day: 0.00    Average packs/day: 1 pack/day for 25.0 years (25.0 ttl pk-yrs)    Types: Cigarettes    Start date: 07/24/1983    Quit date: 07/23/2008    Years since quitting: 16.0    Passive exposure: Never   Smokeless tobacco: Former    Types: Chew    Quit date: 07/24/2020  Substance Use Topics   Alcohol use: Not Currently    Allergies[1]  Active Medications[2]  Immunization History  Administered Date(s) Administered   Influenza,inj,Quad PF,6+ Mos 09/08/2012   Influenza-Unspecified 05/07/2011   Pneumococcal Polysaccharide-23 06/25/2018   Tdap 03/24/2013, 06/25/2018        Objective:     Vitals:   07/30/24 0956  BP: 116/80  Pulse: 72  Temp: 97.6 F (36.4 C)  Height: 5' 6 (1.676 m)  Weight: 174 lb 9.6 oz (79.2 kg)  SpO2: 96%  TempSrc: Temporal  BMI (Calculated): 28.19     SpO2: 96 %  GENERAL: Well-developed, well-nourished gentleman, no acute distress, fully ambulatory, no conversational dyspnea. HEAD: Normocephalic, atraumatic.  EYES: Pupils equal, round, reactive to light.  No scleral icterus.  MOUTH: Few missing teeth, moist.  No thrush. NECK: Supple. No thyromegaly. Trachea midline. No JVD.  No adenopathy. PULMONARY: Good air entry bilaterally. No adventitious sounds.  CARDIOVASCULAR: S1 and S2. Regular rate and rhythm.  No rubs, murmurs or gallops heard. ABDOMEN: Benign. MUSCULOSKELETAL: No joint deformity, no clubbing, no edema.  NEUROLOGIC: No overt focal deficit, no gait disturbance noted.  Speech is fluent. SKIN: Intact,warm,dry.  On limited exam no rashes. PSYCH: Mood and behavior normal.   Recent Results (from the past 2160 hours)  CBC with Differential/Platelet     Status: Abnormal   Collection Time:  05/07/24  8:38 AM  Result Value Ref Range   WBC 5.0 3.4 - 10.8 x10E3/uL   RBC 5.06 4.14 - 5.80 x10E6/uL   Hemoglobin 15.4 13.0 - 17.7 g/dL   Hematocrit 52.4 62.4 - 51.0 %   MCV 94 79 - 97 fL   MCH 30.4 26.6 - 33.0 pg   MCHC 32.4 31.5 - 35.7 g/dL   RDW 87.2 88.3 - 84.5 %   Platelets 204 150 - 450 x10E3/uL   Neutrophils 49 Not Estab. %   Lymphs 24 Not Estab. %   Monocytes 15 Not Estab. %   Eos 11 Not Estab. %   Basos 1 Not Estab. %   Neutrophils Absolute 2.5 1.4 - 7.0 x10E3/uL   Lymphocytes Absolute 1.2 0.7 - 3.1 x10E3/uL   Monocytes Absolute 0.7 0.1 - 0.9 x10E3/uL   EOS (ABSOLUTE) 0.5 (H) 0.0 - 0.4 x10E3/uL   Basophils Absolute 0.0 0.0 - 0.2 x10E3/uL   Immature Granulocytes 0 Not Estab. %   Immature Grans (Abs) 0.0 0.0 - 0.1 x10E3/uL  QuantiFERON-TB Gold Plus     Status: None   Collection Time: 05/07/24  8:38 AM  Result Value Ref Range   QuantiFERON Incubation Incubation performed.    QuantiFERON Criteria Comment     Comment: QuantiFERON-TB Gold Plus is a qualitative indirect test for M tuberculosis infection (including disease) and is intended for use in conjunction with risk assessment, radiography, and other medical and diagnostic evaluations. The QuantiFERON-TB Gold Plus result is determined by subtracting the Nil value from either TB antigen (Ag) value. The Mitogen tube serves as a control for the test.    QuantiFERON TB1 Ag Value 0.10 IU/mL   QuantiFERON TB2 Ag Value 0.10 IU/mL   QuantiFERON Nil Value 0.10 IU/mL   QuantiFERON Mitogen Value >10.00 IU/mL   QuantiFERON-TB Gold Plus Negative Negative    Comment: No response to M tuberculosis antigens detected. Infection with M tuberculosis is unlikely, but high risk individuals should be considered for additional testing (ATS/IDSA/CDC Clinical Practice Guidelines, 2017). The reference range is an Antigen minus Nil result of <0.35 IU/mL. Chemiluminescence immunoassay methodology   CMP14+EGFR     Status: Abnormal    Collection Time: 05/07/24  8:38 AM  Result Value Ref Range   Glucose 90 70 - 99 mg/dL   BUN 23 6 - 24 mg/dL   Creatinine, Ser 9.02 0.76 - 1.27 mg/dL   eGFR 94 >40 fO/fpw/8.26   BUN/Creatinine Ratio 24 (H) 9 - 20   Sodium 142 134 - 144 mmol/L   Potassium 4.7 3.5 - 5.2 mmol/L   Chloride 105 96 - 106 mmol/L   CO2 21 20 - 29 mmol/L   Calcium  9.5 8.7 - 10.2 mg/dL   Total Protein 7.3 6.0 - 8.5 g/dL   Albumin 4.4 3.8 - 4.9 g/dL   Globulin, Total 2.9 1.5 - 4.5 g/dL   Bilirubin Total 0.3 0.0 - 1.2 mg/dL   Alkaline Phosphatase 68 47 - 123 IU/L   AST 25 0 - 40 IU/L   ALT 30 0 - 44 IU/L  Glucose, capillary     Status: None   Collection Time: 05/18/24  7:57 AM  Result Value Ref Range   Glucose-Capillary 78 70 - 99 mg/dL    Comment: Glucose reference range applies only to samples taken after fasting for at least 8 hours.  Pulmonary function test     Status: None  Collection Time: 05/21/24  8:42 AM  Result Value Ref Range   FVC-Pre 3.58 L   FVC-%Pred-Pre 82 %   FVC-Post 3.54 L   FVC-%Pred-Post 81 %   FVC-%Change-Post 0 %   FEV1-Pre 1.80 L   FEV1-%Pred-Pre 53 %   FEV1-Post 2.06 L   FEV1-%Pred-Post 61 %   FEV1-%Change-Post 14 %   FEV6-Pre 3.52 L   FEV6-%Pred-Pre 84 %   FEV6-Post 3.51 L   FEV6-%Pred-Post 83 %   FEV6-%Change-Post 0 %   Pre FEV1/FVC ratio 50 %   FEV1FVC-%Pred-Pre 65 %   Post FEV1/FVC ratio 58 %   FEV1FVC-%Change-Post 15 %   Pre FEV6/FVC Ratio 98 %   FEV6FVC-%Pred-Pre 102 %   Post FEV6/FVC ratio 99 %   FEV6FVC-%Pred-Post 102 %   FEV6FVC-%Change-Post 0 %   FEF 25-75 Pre 0.83 L/sec   FEF2575-%Pred-Pre 27 %   FEF 25-75 Post 1.17 L/sec   FEF2575-%Pred-Post 38 %   FEF2575-%Change-Post 41 %   RV 1.99 L   RV % pred 107 %   TLC 5.76 L   TLC % pred 93 %   DLCO unc 21.20 ml/min/mmHg   DLCO unc % pred 83 %   DLCO cor 20.74 ml/min/mmHg   DLCO cor % pred 81 %   DL/VA 5.65 ml/min/mmHg/L   DL/VA % pred 97 %  Surgical pathology     Status: None   Collection Time:  07/13/24 12:00 AM  Result Value Ref Range   SURGICAL PATHOLOGY      SURGICAL PATHOLOGY [facility] [address], [address] [place], [place] [address] Telephone [phone] or [phone] Fax [phone]  REPORT OF SURGICAL PATHOLOGY   Accession #: [id number] Patient Name: Steve Wang, Steve Wang Visit # : [id number]  MRN: [id number] Physician: Tamea Saunas DOB/Age [birth year] (Age: 12) Gender: M Collected Date: 07/13/2024 Received Date: 07/13/2024  FINAL DIAGNOSIS       1. Lung, biopsy, RUL :       - FRAGMENT OF UNREMARKABLE CARTILAGE AND RESPIRATORY MUCOSA.       2. Lung, biopsy, LUL :       - ALVEOLATED LUNG TISSUE WITH FOCAL NON-CASEATING EPITHELIOID GRANULOMATOUS      INFLAMMATION; AFB AND GMS STAINS ARE NEGATIVE.      - SEE [facility] FOR RESULTS OF PENDING MICROBIOLOGY CULTURES.       Diagnosis Note : The differential diagnosis includes infection, foreign body      reaction, and sarcoidosis.Correlation with clinical impression and pending      microbiology studies is required.Stain controls worked landscape architect.      ELECTRONIC SIGNATURE : Rubinas Md, Rexene , Sports Administrator, Electronic Signature  MICROSCOPIC DESCRIPTION  CASE COMMENTS STAINS USED IN DIAGNOSIS: H&E H&E H&E H&E Stains used in diagnosis 2 *Acid Fast Bacillus Stain, 2 GMS    CLINICAL HISTORY  SPECIMEN(S) OBTAINED 1. Lung, biopsy, RUL 2. Lung, biopsy, LUL  SPECIMEN COMMENTS: SPECIMEN CLINICAL INFORMATION: 1. 54 YO with sarcoid now with increased activity.Sarcoid?  Infection PROC vs persistent activity.Prev.BX: YES, but not at [facility]    Gross Description 1. Received in formalin are tan, soft tissue fragments and blood that are submitted in toto.Number:  6  Size:  <0.1-0.2 cm,  2 blocks 2. Received in formalin are tan, soft tissue fragments and blood that are submitted in toto.Number:  5  Size:  <0.1-0.3 cm,  2 blocks.mb 07-13-24        Report signed out from the  following location(s) [facility] [address] [place], [place] [address] CLIA #: [id number]  [facility] [address] [place], [place] [address] CLIA #: [id number]   Cytology - Non PAP; LUL     Status: None   Collection Time: 07/13/24 12:00 AM  Result Value Ref Range   CYTOLOGY - NON GYN      CYTOLOGY - NON PAP [facility] [address] [place], [place] [address] Telephone [phone] or [phone] Fax [phone]  CYTOPATHOLOGY REPORT   Accession #: [id number] Patient Name: Steve Wang, Steve Wang Visit # : [id number]  MRN: [id number] Physician: Tamea Saunas DOB/Age [birth year] (Age: 59) Gender: M Collected Date: 07/13/2024 Received Date: 07/13/2024  FINAL DIAGNOSIS STATEMENT Of SPECIMEN ADEQUACY:  INTERPRETATION(S):      - NEGATIVE FOR MALIGNANCY.      - REACTIVE BRONCHIAL CELLS.      - NEGATIVE FOR MALIGNANCY.      - REACTIVE BRONCHIAL CELLS.      ELECTRONIC SIGNATURE : Rubinas Md, Rexene , Sports Administrator, Electronic Signature   CASE COMMENTS   CLINICAL HISTORY  SOURCE OF SPECIMEN(S) Fine Needle Aspiration Fine Needle Aspiration  SPECIMEN COMMENTS: SPECIMEN CLINICAL INFORMATION: 1. 54 YO with sarcoid now with increased activity.Sarcoid reactivation vs infection.PREV.BX:  YES, not at [facility]    [employer] scription Specimen: Received is/are 40cc of pink fluid in cytolyt      Prepared:      # Smears: 0      # Concentration Technique Slides (i.e. ThinPrep): 1      # Cell Block: 1      # Diff-Quick Stain: 0      See concurrent case [id number] Specimen: Received is/are 35cc of pink fluid in cytolyt      Prepared:      # Smears: 0      # Concentration Technique Slides (i.e. ThinPrep): 1      # Cell Block: QNS      # Diff-Quick Stain: 0      See concurrent case [id number]        Report signed out from the following location(s) [place] [address] [place], [place] [address] CLIA #: [id number]  [facility] [address] [place], [place] [address] CLIA #: [id number]   Glucose, capillary     Status: None   Collection Time: 07/13/24  6:38 AM  Result Value Ref Range   Glucose-Capillary 88 70 - 99 mg/dL    Comment: Glucose reference range applies only to samples taken after fasting for at least 8 hours.  Culture, Respiratory w Gram Stain     Status: None   Collection Time: 07/13/24  9:32 AM   Specimen: Bronchoalveolar Lavage; Respiratory  Result Value Ref Range   Specimen Description      BRONCHIAL ALVEOLAR LAVAGE Performed at [facility], [address] [place], [place] [address]    Special Requests RUL    Gram Stain NO WBC SEEN NO ORGANISMS SEEN     Culture      RARE Consistent with normal respiratory flora. NO STAPHYLOCOCCUS AUREUS ISOLATED No Pseudomonas species isolated Performed at [facility], [address] [place], [place] [address]    Report Status 07/16/2024 FINAL   Acid Fast Smear (AFB)     Status: None   Collection Time: 07/13/24  9:32 AM   Specimen: Bronchoalveolar Lavage; Respiratory  Result Value Ref Range  AFB Specimen Processing Concentration    Acid Fast Smear Negative     Comment: (NOTE) Performed At: [facility] [address] [place], [place] [id number] Jennette Shorter MD Ey:[id number]    Source (AFB) RUL     Comment: Performed at [facility], [address] [place], [place] [address]  Fungus Culture With Stain     Status: None (Preliminary result)   Collection Time: 07/13/24  9:32 AM   Specimen: Bronchial Alveolar Lavage; Respiratory  Result Value Ref Range   Fungus Stain Final report     Comment: (NOTE) Performed At: [facility] [address] [place], [place] [id number] Jennette Shorter MD Ey:[id number]    Fungus (Mycology) Culture PENDING    Fungal Source RUL     Comment: Performed at [facility], [address] [place], [place] [address]  Fungus Culture Result     Status: None   Collection Time: 07/13/24  9:32 AM  Result Value Ref Range   Result 1 Comment     Comment: (NOTE) KOH/Calcofluor preparation:  no fungus observed. Performed At: [facility] [address] [place], [place] [id number] Jennette Shorter MD Ey:[id number]   Body fluid cell count with differential     Status: Abnormal   Collection Time: 07/13/24  9:34 AM  Result Value Ref Range   Fluid Type-FCT BRONCHIAL ALVEOLAR LAVAGE     Comment: Performed at [facility], [address] [place], [place] [address] CORRECTED ON 12/22 AT 1011: PREVIOUSLY REPORTED AS Bronch Lavag    Color, Fluid PINK (A) YELLOW   Appearance, Fluid HAZY (A) CLEAR   Total Nucleated Cell Count, Fluid 31 cu mm   Neutrophil Count, Fluid 45 %   Lymphs, Fluid 43 %   Monocyte-Macrophage-Serous Fluid 10 %   Eos, Fluid 2 %    Comment: Performed at [facility], [address], [place], [place] [address]  Culture, Respiratory w Gram Stain     Status: None   Collection Time: 07/13/24  9:34 AM   Specimen: Bronchoalveolar Lavage; Respiratory  Result Value Ref Range   Specimen Description      BRONCHIAL ALVEOLAR LAVAGE Performed at [facility], [address] [place], [place] [address]    Special Requests      Normal Performed at [facility], [address] [place], [place] [address]    Gram Stain NO WBC SEEN NO ORGANISMS SEEN     Culture      NO GROWTH 3 DAYS Performed at [facility], [address], [place], [place] [address]    Report Status 07/16/2024 FINAL   Acid Fast Smear (AFB)     Status: None   Collection Time: 07/13/24  9:34 AM   Specimen: Bronchoalveolar Lavage; Respiratory  Result Value Ref Range   AFB Specimen Processing Concentration    Acid Fast Smear Negative     Comment: (NOTE) Performed At: [facility] [address] [place], [place] [id number] Jennette Shorter MD  Ey:[id number]    Source (AFB) LUL     Comment: Performed at [facility], [address] [place], [place] [address]  Fungus Culture With Stain     Status: None (Preliminary result)   Collection Time: 07/13/24  9:34 AM   Specimen: Bronchial Alveolar Lavage; Respiratory  Result Value Ref Range   Fungus Stain Final report     Comment: (NOTE) Performed At: [facility] [address] [place], [place] [id number] Jennette Shorter MD  Ey:[id number]    Fungus (Mycology) Culture PENDING    Fungal Source LUL     Comment: Performed at [facility], [address] [place], [place] [address]  Fungus Culture Result     Status: None   Collection Time: 07/13/24  9:34 AM  Result Value Ref Range   Result 1 Comment     Comment: (NOTE) KOH/Calcofluor preparation:  no fungus observed. Performed At: [facility] [address] [place], [place] [id number] Jennette Shorter MD Ey:[id number]   Troponin T, High Sensitivity     Status: None   Collection Time: 07/13/24 10:40 AM  Result Value Ref Range   Troponin T High Sensitivity <15 0 - 19 ng/L    Comment: (NOTE) Biotin concentrations > 1000 ng/mL falsely decrease TnT results.  Serial cardiac troponin measurements are suggested.  Refer to the Links section for chest pain algorithms and additional  guidance. Performed at [facility], [address] [place], [place] [address]   Comp Met (CMET)     Status: Abnormal   Collection Time: 07/30/24 10:51 AM  Result Value Ref Range   Sodium 136 135 - 145 mmol/L   Potassium 4.5 3.5 - 5.1 mmol/L   Chloride 101 98 - 111 mmol/L   CO2 25 22 - 32 mmol/L   Glucose, Bld 118 (H) 70 - 99 mg/dL    Comment: Glucose reference range applies only to samples taken after fasting for at least 8 hours.   BUN 20 6 - 20 mg/dL   Creatinine, Ser 9.10 0.61 - 1.24 mg/dL   Calcium  9.5 8.9 - 10.3 mg/dL   Total Protein 7.7 6.5 - 8.1 g/dL   Albumin 4.5 3.5 - 5.0 g/dL   AST 18 15 - 41  U/L   ALT 25 0 - 44 U/L   Alkaline Phosphatase 58 38 - 126 U/L   Total Bilirubin 0.4 0.0 - 1.2 mg/dL   GFR, Estimated >39 >39 mL/min    Comment: (NOTE) Calculated using the CKD-EPI Creatinine Equation (2021)    Anion gap 10 5 - 15    Comment: Performed at [facility], [address] [place], [place] [address]  CBC with Differential/Platelet     Status: Abnormal   Collection Time: 07/30/24 10:51 AM  Result Value Ref Range   WBC 9.3 4.0 - 10.5 K/uL   RBC 5.14 4.22 - 5.81 MIL/uL   Hemoglobin 15.8 13.0 - 17.0 g/dL   HCT 52.5 60.9 - 47.9 %   MCV 92.2 80.0 - 100.0 fL   MCH 30.7 26.0 - 34.0 pg   MCHC 33.3 30.0 - 36.0 g/dL   RDW 87.3 88.4 - 84.4 %   Platelets 227 150 - 400 K/uL   nRBC 0.0 0.0 - 0.2 %   Neutrophils Relative % 87 %   Neutro Abs 8.1 (H) 1.7 - 7.7 K/uL   Lymphocytes Relative 7 %   Lymphs Abs 0.6 (L) 0.7 - 4.0 K/uL   Monocytes Relative 4 %   Monocytes Absolute 0.4 0.1 - 1.0 K/uL   Eosinophils Relative 1 %   Eosinophils Absolute 0.1 0.0 - 0.5 K/uL   Basophils Relative 0 %   Basophils Absolute 0.0 0.0 - 0.1 K/uL   Immature Granulocytes 1 %   Abs Immature Granulocytes 0.06 0.00 - 0.07 K/uL    Comment: Performed at [facility], [address] [place], [place] [address]  Angiotensin converting enzyme     Status: None   Collection Time: 07/30/24  10:51 AM  Result Value Ref Range   Angiotensin-Converting Enzyme 27 14 - 82 U/L    Comment: (NOTE) Performed At: [facility] [address] [place], [place] [id number] Jennette Shorter MD Ey:[id number]     Assessment & Plan:     ICD-10-CM   1. 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 Stage 2 moderate COPD by GOLD classification (HCC)  J44.9     3. 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.9    Due to sarcoidosis      Orders Placed This Encounter  Procedures   Angiotensin converting enzyme    Standing Status:   Future    Number of Occurrences:   1    Expected Date:   07/30/2024    Expiration Date:   07/30/2025   CBC with Differential/Platelet    Standing Status:   Future    Number of Occurrences:   1    Expected Date:   07/30/2024    Expiration Date:   07/30/2025   Comp Met (CMET)    Standing Status:   Future    Number of Occurrences:   1    Expected Date:   07/30/2024    Expiration Date:   07/30/2025    Meds ordered this encounter  Medications   predniSONE  (DELTASONE ) 10 MG tablet    Sig: Take 1 tablet (10 mg total) by mouth daily with breakfast.    Dispense:  30 tablet    Refill:  2   Discussion:    Sarcoidosis with pulmonary and cardiac involvement Sarcoidosis with pulmonary involvement confirmed by bronchoscopy showing non-caseating granulomatous inflammation in the left lung. No active infection or TB detected. Right upper lobe shows scarring, indicating non-active disease. Cardiac involvement requires management with mycophenolate due to its lower cardiac toxicity compared to other treatments. Prednisone  tapering planned as mycophenolate is initiated. Mycophenolate is expected to be less toxic to the heart and effective for cardiac sarcoidosis. Treatment duration is typically six months, with potential for weaning off or continued use at a lower dose based on response. - Started mycophenolate (CellCept) 500 mg twice daily for first month, will increase as tolerated to goal of 1000 mg - 1500 mg twice daily.   - Decreased prednisone  to 10 mg daily. - Obtained pretreatment CBC, CMP, and angiotensin converting enzyme level. - Referred to pharmacy team for mycophenolate monitoring and dosing. - Scheduled follow-up in 4-6 weeks.     Patient advised to keep heart failure clinic appointment as scheduled.  Advised if symptoms do not improve or worsen, to please contact  office for sooner follow up or seek emergency care.    I spent 42 minutes of dedicated to the care of this patient on the date of this encounter to include pre-visit review of records, face-to-face time with the patient discussing conditions above, post visit ordering of testing, clinical documentation with the electronic health record, making appropriate referrals as documented, and communicating necessary findings to members of the patients care team.     C. Leita Sanders, MD Advanced Bronchoscopy PCCM [facility]    *This note was generated using voice recognition software/Dragon and/or AI transcription program.  Despite best efforts to proofread, errors can occur which can change the meaning. Any transcriptional errors that  result from this process are unintentional and may not be fully corrected at the time of dictation.    [1] No Known Allergies [2]  Current Meds  Medication Sig   empagliflozin  (JARDIANCE ) 10 MG TABS tablet Take 1 tablet (10 mg total) by mouth daily before breakfast.   ibuprofen  (ADVIL ) 800 MG tablet TAKE 1 TABLET (800 MG TOTAL) BY MOUTH DAILY AS NEEDED.   metoprolol  succinate (TOPROL -XL) 25 MG 24 hr tablet TAKE 1/2 TABLET BY MOUTH EVERY DAY   predniSONE  (DELTASONE ) 10 MG tablet Take 1 tablet (10 mg total) by mouth daily with breakfast.   Tiotropium Bromide-Olodaterol (STIOLTO RESPIMAT ) 2.5-2.5 MCG/ACT AERS INHALE 2 PUFFS BY MOUTH INTO THE LUNGS DAILY   [DISCONTINUED] predniSONE  (DELTASONE ) 10 MG tablet Take 2 tablets (20 mg total) by mouth daily with breakfast.   "

## 2024-07-31 LAB — ANGIOTENSIN CONVERTING ENZYME: Angiotensin-Converting Enzyme: 27 U/L (ref 14–82)

## 2024-08-06 ENCOUNTER — Encounter: Payer: Self-pay | Admitting: Pulmonary Disease

## 2024-08-06 NOTE — Telephone Encounter (Signed)
 I reviewed the report since I cannot look at the images as they were done through [facility].  However, Dr. Bensimhon may be able to procure them when he sees him for his advanced heart failure clinic.  The MRI report however indicates that the decreased function of the heart is due to cardiac sarcoid.  This means that even with the Humira  the cardiac sarcoid was likely active as well.  Hopefully that mycophenolate should be helpful with this.  I will be working closely with Dr. Cherrie in this regard.

## 2024-08-08 ENCOUNTER — Other Ambulatory Visit: Payer: Self-pay | Admitting: Internal Medicine

## 2024-08-08 DIAGNOSIS — M159 Polyosteoarthritis, unspecified: Secondary | ICD-10-CM

## 2024-08-10 NOTE — Telephone Encounter (Signed)
 Last Fill: 05/07/2024  Next Visit: Due 02/05/2024. Message sent to the front to schedule.   Last Visit: 11/06/2023  Dx: Osteoarthritis   Current Dose per office note on 11/06/2023: Advise not to exceed 800 mg per dose.   Okay to refill Ibuprofen  ?

## 2024-08-10 NOTE — Telephone Encounter (Signed)
 Please schedule patient a follow up visit. Patient due 02/05/2024. Thanks!

## 2024-08-10 NOTE — Telephone Encounter (Signed)
Attempted to contact patient to schedule appointment. Unable to leave message.

## 2024-08-13 ENCOUNTER — Encounter: Payer: Self-pay | Admitting: Pulmonary Disease

## 2024-08-13 ENCOUNTER — Other Ambulatory Visit (HOSPITAL_COMMUNITY): Payer: Self-pay

## 2024-08-13 ENCOUNTER — Ambulatory Visit: Admitting: Pulmonary Disease

## 2024-08-13 ENCOUNTER — Telehealth: Payer: Self-pay

## 2024-08-13 DIAGNOSIS — D869 Sarcoidosis, unspecified: Secondary | ICD-10-CM

## 2024-08-13 DIAGNOSIS — Z79899 Other long term (current) drug therapy: Secondary | ICD-10-CM

## 2024-08-13 LAB — FUNGUS CULTURE RESULT

## 2024-08-13 LAB — FUNGUS CULTURE WITH STAIN

## 2024-08-13 LAB — FUNGAL ORGANISM REFLEX

## 2024-08-13 MED ORDER — SULFAMETHOXAZOLE-TRIMETHOPRIM 800-160 MG PO TABS: 1.0000 | ORAL_TABLET | ORAL | 2 refills | Status: AC

## 2024-08-13 MED ORDER — MYCOPHENOLATE MOFETIL 500 MG PO TABS: ORAL_TABLET | ORAL | 0 refills | Status: AC

## 2024-08-13 NOTE — Telephone Encounter (Signed)
 He can increase his prednisone  to 20 mg daily.  I was reviewing his chart yesterday and I see that pharmacy team has not contacted him about the mycophenolate .  Will investigate.

## 2024-08-13 NOTE — Telephone Encounter (Signed)
 Subjective:   Patient is contacted by pharmacy team today for new start CellCept  counseling.  Received referral for new start CellCept  for sarcoidosis with cardiac and pulmonary involvement at time of OV with Dr. Tamea on 07/30/24. At that time, prednisone  dose reduced to 10mg  daily from 20mg  daily. He messaged today (08/13/24) complaining of worsened symptoms. Plan to increase prednisone  to 20mg  daily and start CellCept .   Contacting patient today via telephone for new start CellCept  counseling. In the past, he did not use this medication as he and his spouse were family planning. We reviewed this today, and he reports that is no longer the case. He and spouse are no longer trying to become pregnant.  Baseline labs were reviewed:  - Hepatitis B surface antigen, non-reactive (06/11/22) - Hepatitis B core antibody, non-reactive (06/11/22) - Hepatitis C Ab, non-reactive (06/11/22) - Quantiferon TB Gold, negative (05/07/24)  - CBC, CMP obtained at baseline (07/30/24)  CBC    Component Value Date/Time   WBC 9.3 07/30/2024 1051   RBC 5.14 07/30/2024 1051   HGB 15.8 07/30/2024 1051   HGB 15.4 05/07/2024 0838   HCT 47.4 07/30/2024 1051   HCT 47.5 05/07/2024 0838   PLT 227 07/30/2024 1051   PLT 204 05/07/2024 0838   MCV 92.2 07/30/2024 1051   MCV 94 05/07/2024 0838   MCH 30.7 07/30/2024 1051   MCHC 33.3 07/30/2024 1051   RDW 12.6 07/30/2024 1051   RDW 12.7 05/07/2024 0838   LYMPHSABS 0.6 (L) 07/30/2024 1051   LYMPHSABS 1.2 05/07/2024 0838   MONOABS 0.4 07/30/2024 1051   EOSABS 0.1 07/30/2024 1051   EOSABS 0.5 (H) 05/07/2024 0838   BASOSABS 0.0 07/30/2024 1051   BASOSABS 0.0 05/07/2024 0838    CMP     Component Value Date/Time   NA 136 07/30/2024 1051   NA 142 05/07/2024 0838   K 4.5 07/30/2024 1051   CL 101 07/30/2024 1051   CO2 25 07/30/2024 1051   GLUCOSE 118 (H) 07/30/2024 1051   BUN 20 07/30/2024 1051   BUN 23 05/07/2024 0838   CREATININE 0.89 07/30/2024 1051    CREATININE 0.88 11/06/2023 1058   CALCIUM  9.5 07/30/2024 1051   PROT 7.7 07/30/2024 1051   PROT 7.3 05/07/2024 0838   ALBUMIN 4.5 07/30/2024 1051   ALBUMIN 4.4 05/07/2024 0838   AST 18 07/30/2024 1051   ALT 25 07/30/2024 1051   ALKPHOS 58 07/30/2024 1051   BILITOT 0.4 07/30/2024 1051   BILITOT 0.3 05/07/2024 0838   EGFR 94 05/07/2024 0838   GFRNONAA >60 07/30/2024 1051   Assessment/Plan:  Patient was counseled on the purpose, proper use, and adverse effects of mycophenolate  including risk of infection, new or reactivation of viral infections, nausea, and headaches. Given effects of food on bioavailability are minor, may administer with or without food given this indication for use. Due to GI-related side effects, may be best taken with food. Reviewed missed dose instructions - take missed dose as soon as you remember. If it is already time for the next dose, skip the missed dose and continue with usual dosing schedule. Do not take a double dose to make up for missed dose.  Discussed ways to prevent infections. Avoid sick contacts. Wear a mask if you feel appropriate and if in crowded areas, such as airports. Do not use live attenuated vaccines while taking CellCept .  Discussed warning of increased risk of certain malignancies, particularly of the skin.    Discussed risk of neutropenia and discussed  importance of regular labs to monitor blood counts, liver function, and kidney function.  PLAN:  - CONTINUE prednisone  20mg  daily  - START mycophenolate  (CellCept ) 500mg  BID x 2 weeks, then recheck CBC/CMP. If stable and when instructed, increase to 1,000mg  BID x 2 weeks. Repeat CBC/CMP. If stable and when instructed, increase to 1,500mg  BID thereafter. - He will obtain labs at [facility]. He understands that he will not increase dose of CellCept  until he hears from our office with lab results.  - Follow-up with Dr. Tamea on 09/17/24  Aleck Puls, PharmD, BCPS, CPP Clinical Pharmacist   [facility]  [facility]

## 2024-08-13 NOTE — Telephone Encounter (Signed)
 Addendum to previous -  - Patient denies history of skin cancer. Reports he has upcoming route dermatology check up. - Patient verbalizes understanding that CellCept  exposure has been associated with birth defects, and effective contraception is recommended

## 2024-08-13 NOTE — Addendum Note (Signed)
 Addended by: [name] on: 08/13/2024 04:07 PM   Modules accepted: Orders

## 2024-08-18 LAB — FUNGUS CULTURE WITH STAIN

## 2024-08-18 LAB — FUNGUS CULTURE RESULT

## 2024-08-18 LAB — FUNGAL ORGANISM REFLEX

## 2024-08-19 ENCOUNTER — Telehealth (HOSPITAL_COMMUNITY): Payer: Self-pay | Admitting: Internal Medicine

## 2024-08-19 NOTE — Telephone Encounter (Signed)
 Called to confirm/remind patient of their appointment at the [facility] on 08/19/2024.   Appointment:   [] Confirmed  [] Left mess   [] No answer/No voice mail  [] VM Full/unable to leave message  [x] Phone not in service  Patient reminded to bring all medications and/or complete list.  Confirmed patient has transportation. Gave directions, instructed to utilize valet parking.

## 2024-08-20 ENCOUNTER — Encounter (HOSPITAL_COMMUNITY): Payer: Self-pay | Admitting: Internal Medicine

## 2024-08-20 ENCOUNTER — Ambulatory Visit (HOSPITAL_COMMUNITY)
Admission: RE | Admit: 2024-08-20 | Discharge: 2024-08-20 | Disposition: A | Discharge status: Home / Self Care | Source: Ambulatory Visit | Attending: Internal Medicine | Admitting: Internal Medicine

## 2024-08-20 VITALS — BP 110/70 | HR 71 | Wt 177.0 lb

## 2024-08-20 DIAGNOSIS — D860 Sarcoidosis of lung: Secondary | ICD-10-CM | POA: Insufficient documentation

## 2024-08-20 DIAGNOSIS — Z7984 Long term (current) use of oral hypoglycemic drugs: Secondary | ICD-10-CM | POA: Diagnosis not present

## 2024-08-20 DIAGNOSIS — D8685 Sarcoid myocarditis: Secondary | ICD-10-CM | POA: Insufficient documentation

## 2024-08-20 DIAGNOSIS — Z79899 Other long term (current) drug therapy: Secondary | ICD-10-CM | POA: Diagnosis not present

## 2024-08-20 DIAGNOSIS — I5020 Unspecified systolic (congestive) heart failure: Secondary | ICD-10-CM

## 2024-08-20 DIAGNOSIS — R140 Abdominal distension (gaseous): Secondary | ICD-10-CM | POA: Diagnosis not present

## 2024-08-20 MED ORDER — PANTOPRAZOLE SODIUM 40 MG PO TBEC: 40.0000 mg | DELAYED_RELEASE_TABLET | Freq: Every day | ORAL | 11 refills | Status: AC

## 2024-08-20 MED ORDER — FUROSEMIDE 20 MG PO TABS: 20.0000 mg | ORAL_TABLET | Freq: Every day | ORAL | 6 refills | Status: AC | PRN

## 2024-08-20 MED ORDER — LOSARTAN POTASSIUM 25 MG PO TABS: 25.0000 mg | ORAL_TABLET | Freq: Every day | ORAL | 6 refills | Status: AC

## 2024-08-20 NOTE — Patient Instructions (Addendum)
 Medication Changes:  START Losartan  25 mg Daily  START Protonix  40 mg Daily  START Furosemide  20 mg every Monday and Friday, can take extra as needed   Testing/Procedures:  You are scheduled for a Cardiopulmonary Exercise (CPX) Test as [facility] on: Date:      Time:   Expect to be in the lab for 2 hours. Please plan to arrive 30 minutes prior to your appointment. You may be asked to reschedule your test if you arrive 20 minutes or more after your scheduled appointment time.  Main Campus address: [address] [place], [place] [address] You may arrive to the Main Entrance A or Entrance C (free valet parking is available at both). -Main Entrance A (on [address]) :proceed to admitting for check in -Entrance C (on [employer]): proceed to [employer] parking or under hospital deck parking using this code _________  Check In: [facility] waiting room (1st floor)   General Instructions for the day of the test (Please follow all instructions from your physician): Refrain from ingesting a heavy meal, alcohol, or caffeine or using tobacco products within 2 hours of the test (DO NOT FAST for mare than 8 hours). You may have all other non-alcoholic, non -caffeinated beverage,a light snack (crackers,a piece of fruit, carrot sticks, toast bagel,etc) up to your appointment. Avoid significant exertion or exercise within 24 hours of your test. Be prepared to exercise and sweat. Your clothing should permit freedom of movement and include walking or running shoes. Women bring loose fitting short sleeved blouse.  This evaluation may be fatiguing and you may wish ti have someone accompany you to the assessment to drive you home afterward. Bring a list of your medications with you, including dosage and frequency you take the medications (  I.e.,once per day, twice per day, etc). Take all medications as prescribed, unless noted below or instructed to do so by your physician.  Please do  not take the following medications prior to your CPX:  _________________________________________________  _________________________________________________  Brief description of the test: A brief lung test will be performed. This will involve you taking deep breaths and blowing hard and fast through your mouth. During these , a clip will be on your nose and you will be breathing through a breathing device.   For the exercise portion of the test you will be walking on a treadmill, or riding a stationary bike, to your maximal effor or until symptoms such as chest pain, shortness of breath, leg pain or dizziness limit your exercise. You will be breathing in and out of a breathing device through your mouth (a clip will be on your nose again). Your heart rate, ECG, blood pressure, oxygen saturations, breathing rate and depth, amount of oxygen you consume and amount of carbon dioxide you produce will be measured and monitored throughout the exercise test.  If you need to cancel or reschedule your appointment please call [phone] If you have further questions please call your physician or Damien Nunnery at [phone]   Special Instructions // Education:  Do the following things EVERYDAY: Weigh yourself in the morning before breakfast. Write it down and keep it in a log. Take your medicines as prescribed Eat low salt foods--Limit salt (sodium) to 2000 mg per day.  Stay as active as you can everyday Limit all fluids for the day to less than 2 liters   Follow-Up in: 3-4 months   At the Advanced [facility] Failure Clinic, you and your  health needs are our priority. We have a designated team specialized in the treatment of Heart Failure. This Care Team includes your primary Heart Failure Specialized Cardiologist (physician), Advanced Practice Providers (APPs- Physician Assistants and Nurse Practitioners), and Pharmacist who all work together to provide you with the care you need, when you need it.    You may see any of the following providers on your designated Care Team at your next follow up:  Dr. Toribio Fuel Dr. Ezra Shuck Dr. Odis Brownie Greig Mosses, NP Caffie Shed, [place] [facility] [place], [place] Beckey Coe, NP Jordan Lee, NP Tinnie Redman, PharmD   Please be sure to bring in all your medications bottles to every appointment.   Need to Contact Us :  If you have any questions or concerns before your next appointment please send us  a message through [place] or call our office at [phone].    TO LEAVE A MESSAGE FOR THE NURSE SELECT OPTION 2, PLEASE LEAVE A MESSAGE INCLUDING: YOUR NAME DATE OF BIRTH CALL BACK NUMBER REASON FOR CALL**this is important as we prioritize the call backs  YOU WILL RECEIVE A CALL BACK THE SAME DAY AS LONG AS YOU CALL BEFORE 4:00 PM

## 2024-08-20 NOTE — Progress Notes (Signed)
 "  ADVANCED HF CLINIC CONSULT NOTE  Referring Physician: Dr. Lenda Primary Care: Patient, No Pcp Per Primary Cardiologist: Evalene Lunger, MD  Chief Complaint: Cardiac sarcoidosis  HPI:  Steve Wang is a 54 year old male with cardiac and pulmonary sarcoid referred by Dr. Lenda for further evaluation of his HF  Formerly followed by Dr. Rolan but more recently has been following With Dr. Orlin at the [facility] sarcoid clinic  PET 4/25 EF 45% No cardiac sarcoid activity   In August 2025, he experienced a significant worsening of his lung condition, which he attributes to Humira . A CT scan in October showed a spot in his lungs that increased in size from 9 mm to 1.7 cm, initially suspected to be cancer but later suspected to be paradoxical reaction to Humira  with worsening cardiac sarcoidosis.   He temporarily stopped taking Humira , metoprolol , and Jardiance  in August 2025 due to personal circumstances, including the death of his father. He has since resumed these medications and is currently on prednisone , metoprolol , and Jardiance . His blood pressure is usually around 100-120/60s, but he experienced a spike to 139/97 during a possible panic attack.  A PET scan on October 27th at [facility] showed increased activity in the lungs but not in the heart.   cMRI 08/03/24 EF 39% There is diffuse hypokinesia with thinning and akinesia of the basal anterolateral wall. Delayed enhancement imaging demonstrates diffuse LGE throughout the LV myocardium as well as RV, mostly in a non-ischemic mid-myocardial pattern. With hx of biopsy proved sarcoid, this is suggestive of cardiac sarcoid. No ischemia with stress  Saw Dr. Lenda a few weeks ago and prednisone  tapered down to 10 and CellCept  ordered. He has not started CellCept . His symptoms got worse with SOB, cough, joint pain and flaky hair. He bumped prednisone  up to 20 and he feels better. No edema, orthopnea but often feels bloated. Works doing H[employer]ITAL [employer].  Starting to do exercise program on bike.   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 09/2018 Echo: EF 55-60%; b. 08/2020 Echo: EF 45-50%, grade 2 diastolic dysfunction; c. 10/2020 cMRI: EF 36%, mild to mod dil LV. Mod red RV fxn. No LGE/evidence of sarcoid. No signif valvular dzs   History of kidney stones    History of tobacco abuse    Long-term corticosteroid use    Mediastinal adenopathy    Migraines    NICM (nonischemic cardiomyopathy) (HCC)    a. 09/2018 Echo: EF 55-60%; b. 08/2020 Echo: EF 45-50%; c. 10/2020 cMRI: EF 36%, mild to mod dil LV. Mod red RV fxn. No LGE/evidence of sarcoid. No signif valvular dzs; d. 12/2020 Cor CTA: Ca2+= 0. Nl cors.   Pneumonia    Pre-diabetes    Pulmonary nodule    a. 12/2020 CT chest: 1.3cm posterior basal LUL nodule and 3mm LLL nodule - unchanged.   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 90 tablet 3   metoprolol  succinate (TOPROL -XL) 25 MG 24 hr tablet TAKE 1/2 TABLET BY MOUTH EVERY DAY 45 tablet 2   predniSONE  (DELTASONE ) 10 MG tablet Take 1 tablet (10 mg total) by mouth daily with breakfast. (Patient taking differently: Take 20 mg by mouth daily with breakfast.) 30 tablet 2   Tiotropium Bromide-Olodaterol (STIOLTO RESPIMAT ) 2.5-2.5 MCG/ACT AERS INHALE 2 PUFFS BY MOUTH INTO THE LUNGS DAILY 4 g 11    mycophenolate  (CELLCEPT ) 500 MG tablet Take 1 tab by mouth twice daily x 2 weeks. REPEAT LABS. If stable, increase to 2 tabs twice daily x 2 weeks. REPEAT LABS. If stable, increase to 3 tabs twice daily. REPEAT LABS in 2 weeks then every 3 months. (Patient not taking: Reported on 08/20/2024) 168 tablet 0   sulfamethoxazole -trimethoprim  (BACTRIM  DS) 800-160 MG tablet Take 1 tablet by mouth 3 (three) times a week. (Patient not taking: Reported on 08/20/2024) 12 tablet 2   No current facility-administered medications for this encounter.   Facility-Administered Medications Ordered in Other Encounters  Medication Dose Route Frequency Provider Last Rate Last Admin   albuterol  (PROVENTIL ) (2.5 MG/3ML) 0.083% nebulizer solution 2.5 mg  2.5 mg Nebulization Once [name], MD        Allergies[1]    Social History   Socioeconomic History   Marital status: Married    Spouse name: Thaxton,Melissa   Number of children: 1   Years of education: Not on file   Highest education level: Not on file  Occupational History   Occupation: [employer]  Tobacco Use   Smoking status: Former    Current packs/day: 0.00    Average packs/day: 1 pack/day for 25.0 years (25.0 ttl pk-yrs)    Types: Cigarettes    Start date: 07/24/1983    Quit date: 07/23/2008    Years since quitting: 16.0    Passive exposure: Never   Smokeless tobacco: Former    Types: Chew    Quit date: 07/24/2020  Vaping Use   Vaping status: Never Used  Substance and Sexual Activity   Alcohol use: Not Currently   Drug use: Not Currently   Sexual activity: Not on file  Other Topics Concern   Not on file  Social History Narrative   Not on file   Social Drivers of Health   Tobacco Use: Medium Risk (08/20/2024)   Patient History    Smoking Tobacco Use: Former    Smokeless Tobacco Use: Former    Passive Exposure: Never  Programmer, Applications: Low Risk  (08/10/2024)   Received from [facility]   Overall  Financial Resource Strain (CARDIA)    Difficulty of Paying Living Expenses: Not hard at all  Food Insecurity: No Food Insecurity (08/10/2024)   Received from [facility]   Epic    Within the past 12 months, you worried that your food would run out before you got the money to buy more.: Never true    Within the past 12 months, the food you bought just didn't last and you didn't have money to get more.: Never true  Transportation Needs: No Transportation Needs (08/10/2024)   Received from [facility]    In the past 12 months, has lack of transportation kept you from medical appointments or from getting medications?: No  Lack of Transportation (Non-Medical): No  Physical Activity: Not on file  Stress: Not on file  Social Connections: Not on file  Intimate Partner Violence: Not on file  Depression (PHQ2-9): Low Risk (12/27/2022)   Depression (PHQ2-9)    PHQ-2 Score: 3  Alcohol Screen: Not on file  Housing: Low Risk  (08/10/2024)   Received from [facility]   Epic    In the last 12 months, was there a time when you were not able to pay the mortgage or rent on time?: No    In the past 12 months, how many times have you moved where you were living?: 0    At any time in the past 12 months, were you homeless or living in a shelter (including now)?: No  Utilities: Not At Risk (08/10/2024)   Received from [facility]   Epic    In the past 12 months has the electric, gas, oil, or water company threatened to shut off services in your home?: No  Health Literacy: Not on file      Family History  Problem Relation Age of Onset   COPD Mother    Colon cancer Neg Hx    Pancreatic cancer Neg Hx    Liver cancer Neg Hx    Esophageal cancer Neg Hx    Stomach cancer Neg Hx     Vitals:   08/20/24 1008  BP: 110/70  Pulse: 71  SpO2: 96%  Weight: 80.3 kg (177 lb)    PHYSICAL EXAM: General:  Well  appearing. No respiratory difficulty HEENT: normal Neck: supple. no JVD. Carotids 2+ bilat; no bruits. No lymphadenopathy or thryomegaly appreciated. Cor: PMI nondisplaced. Regular rate & rhythm. No rubs, gallops or murmurs. Lungs: clear Abdomen: soft, nontender, nondistended. No hepatosplenomegaly. No bruits or masses. Good bowel sounds. Extremities: no cyanosis, clubbing, rash, edema Neuro: alert & oriented x 3, cranial nerves grossly intact. moves all 4 extremities w/o difficulty. Affect pleasant.     ASSESSMENT & PLAN:  1. Cardiac sarcoidosis with heart failure and mildly reduced ejection fraction - Recent PET with no cardiac activity  Lung involvement seems to be dominant and is now the treatment-defining organ. Continue prednisone  per pulmonary. He has not been taking CellCept  or bactrim  as instructed -  Holter at [facility]  7/24 Ventricular ectopy consisted of 2,808 VE beats with a burden of <1 %.     4. There were 223 SVE beats with a burden of <1 %  - stress MRI 1/26 EF 39% no evidence of ischemia - NYHA II - Volume mildly elevated -> start lasix  20mg  M/F + PRN - Continue Toprol  12.5 - Continue Jardiance  10  - Continue spiro 12.5 - Off Entresto  due to low BP   - Start Losartan  25 - Has appt with EP at [facility] - currently no clear indication for ICD - Check CPX test  - stressed need to start CellCept  and Bactrim . Have also ordered PPI for GI stress ulcer prophylaxis  I spent a total of 42 minutes today: 1) reviewing the patient's medical records including previous charts, labs and recent notes from other providers; 2) examining the patient and counseling them on their medical issues/explaining the plan of care; 3) adjusting meds as needed and 4) ordering lab work or other needed tests.     Toribio Fuel, MD  10:37 AM      [1] No Known Allergies  "

## 2024-08-27 LAB — ACID FAST CULTURE WITH REFLEXED SENSITIVITIES (MYCOBACTERIA): Acid Fast Culture: NEGATIVE

## 2024-09-01 ENCOUNTER — Ambulatory Visit: Payer: BC Managed Care – PPO | Admitting: Dermatology

## 2024-09-17 ENCOUNTER — Ambulatory Visit: Admitting: Pulmonary Disease

## 2024-09-21 ENCOUNTER — Encounter (HOSPITAL_COMMUNITY)

## 2024-11-16 ENCOUNTER — Ambulatory Visit: Admitting: Internal Medicine
